# Patient Record
Sex: Female | Born: 1965 | Race: Black or African American | Hispanic: No | Marital: Married | Smoking: Former smoker
Health system: Southern US, Community
[De-identification: ages and names within clinical notes are randomized; demographics above are authoritative.]

## PROBLEM LIST (undated history)

## (undated) DIAGNOSIS — R42 Dizziness and giddiness: Secondary | ICD-10-CM

## (undated) DIAGNOSIS — F419 Anxiety disorder, unspecified: Secondary | ICD-10-CM

## (undated) DIAGNOSIS — I639 Cerebral infarction, unspecified: Secondary | ICD-10-CM

## (undated) DIAGNOSIS — R51 Headache: Secondary | ICD-10-CM

## (undated) DIAGNOSIS — I10 Essential (primary) hypertension: Secondary | ICD-10-CM

## (undated) DIAGNOSIS — J45909 Unspecified asthma, uncomplicated: Secondary | ICD-10-CM

## (undated) DIAGNOSIS — R0600 Dyspnea, unspecified: Secondary | ICD-10-CM

## (undated) DIAGNOSIS — K219 Gastro-esophageal reflux disease without esophagitis: Secondary | ICD-10-CM

## (undated) DIAGNOSIS — R519 Headache, unspecified: Secondary | ICD-10-CM

## (undated) DIAGNOSIS — E785 Hyperlipidemia, unspecified: Secondary | ICD-10-CM

## (undated) DIAGNOSIS — J449 Chronic obstructive pulmonary disease, unspecified: Secondary | ICD-10-CM

## (undated) HISTORY — DX: Gastro-esophageal reflux disease without esophagitis: K21.9

## (undated) HISTORY — DX: Dizziness and giddiness: R42

## (undated) HISTORY — DX: Hyperlipidemia, unspecified: E78.5

## (undated) HISTORY — PX: TUBAL LIGATION: SHX77

---

## 2013-05-24 ENCOUNTER — Emergency Department: Payer: Self-pay | Admitting: Emergency Medicine

## 2013-06-18 ENCOUNTER — Emergency Department: Payer: Self-pay | Admitting: Emergency Medicine

## 2013-08-05 ENCOUNTER — Emergency Department: Payer: Self-pay | Admitting: Internal Medicine

## 2013-09-03 LAB — URINALYSIS, COMPLETE
BILIRUBIN, UR: NEGATIVE
BLOOD: NEGATIVE
GLUCOSE, UR: NEGATIVE mg/dL (ref 0–75)
Ketone: NEGATIVE
LEUKOCYTE ESTERASE: NEGATIVE
Nitrite: NEGATIVE
Ph: 5 (ref 4.5–8.0)
Protein: NEGATIVE
RBC,UR: NONE SEEN /HPF (ref 0–5)
Specific Gravity: 1.019 (ref 1.003–1.030)
WBC UR: 3 /HPF (ref 0–5)

## 2013-09-03 LAB — BASIC METABOLIC PANEL
ANION GAP: 6 — AB (ref 7–16)
BUN: 13 mg/dL (ref 7–18)
CALCIUM: 8.6 mg/dL (ref 8.5–10.1)
CHLORIDE: 108 mmol/L — AB (ref 98–107)
Co2: 25 mmol/L (ref 21–32)
Creatinine: 0.74 mg/dL (ref 0.60–1.30)
GLUCOSE: 88 mg/dL (ref 65–99)
OSMOLALITY: 277 (ref 275–301)
Potassium: 3.6 mmol/L (ref 3.5–5.1)
Sodium: 139 mmol/L (ref 136–145)

## 2013-09-03 LAB — CBC
HCT: 36.8 % (ref 35.0–47.0)
HGB: 11.6 g/dL — ABNORMAL LOW (ref 12.0–16.0)
MCH: 27.9 pg (ref 26.0–34.0)
MCHC: 31.4 g/dL — ABNORMAL LOW (ref 32.0–36.0)
MCV: 89 fL (ref 80–100)
Platelet: 284 10*3/uL (ref 150–440)
RBC: 4.14 10*6/uL (ref 3.80–5.20)
RDW: 13.2 % (ref 11.5–14.5)
WBC: 4.6 10*3/uL (ref 3.6–11.0)

## 2013-09-03 LAB — TROPONIN I: Troponin-I: <0.02 ng/mL

## 2013-09-04 ENCOUNTER — Observation Stay: Payer: Self-pay | Admitting: Internal Medicine

## 2013-09-04 LAB — ETHANOL
Ethanol %: <0.003 % (ref 0.000–0.080)
Ethanol: <3 mg/dL

## 2013-12-17 ENCOUNTER — Emergency Department: Payer: Self-pay | Admitting: Internal Medicine

## 2013-12-17 LAB — BASIC METABOLIC PANEL
ANION GAP: 7 (ref 7–16)
BUN: 10 mg/dL (ref 7–18)
CREATININE: 0.87 mg/dL (ref 0.60–1.30)
Calcium, Total: 9 mg/dL (ref 8.5–10.1)
Chloride: 109 mmol/L — ABNORMAL HIGH (ref 98–107)
Co2: 24 mmol/L (ref 21–32)
EGFR (African American): >60
EGFR (Non-African Amer.): >60
Glucose: 84 mg/dL (ref 65–99)
OSMOLALITY: 278 (ref 275–301)
Potassium: 3.8 mmol/L (ref 3.5–5.1)
Sodium: 140 mmol/L (ref 136–145)

## 2013-12-17 LAB — CBC WITH DIFFERENTIAL/PLATELET
BASOS ABS: 0.1 10*3/uL (ref 0.0–0.1)
Basophil %: 0.9 %
Eosinophil #: 0.2 10*3/uL (ref 0.0–0.7)
Eosinophil %: 3.3 %
HCT: 36.2 % (ref 35.0–47.0)
HGB: 12.1 g/dL (ref 12.0–16.0)
LYMPHS ABS: 2.3 10*3/uL (ref 1.0–3.6)
LYMPHS PCT: 40.5 %
MCH: 29.2 pg (ref 26.0–34.0)
MCHC: 33.4 g/dL (ref 32.0–36.0)
MCV: 88 fL (ref 80–100)
MONO ABS: 0.7 x10 3/mm (ref 0.2–0.9)
Monocyte %: 12.2 %
Neutrophil #: 2.4 10*3/uL (ref 1.4–6.5)
Neutrophil %: 43.1 %
Platelet: 279 10*3/uL (ref 150–440)
RBC: 4.13 10*6/uL (ref 3.80–5.20)
RDW: 14.9 % — ABNORMAL HIGH (ref 11.5–14.5)
WBC: 5.6 10*3/uL (ref 3.6–11.0)

## 2014-05-13 ENCOUNTER — Emergency Department: Payer: Self-pay | Admitting: Emergency Medicine

## 2014-05-13 LAB — CBC
HCT: 39.8 % (ref 35.0–47.0)
HGB: 13.2 g/dL (ref 12.0–16.0)
MCH: 29.8 pg (ref 26.0–34.0)
MCHC: 33.2 g/dL (ref 32.0–36.0)
MCV: 90 fL (ref 80–100)
Platelet: 320 10*3/uL (ref 150–440)
RBC: 4.43 10*6/uL (ref 3.80–5.20)
RDW: 14 % (ref 11.5–14.5)
WBC: 7.5 10*3/uL (ref 3.6–11.0)

## 2014-05-13 LAB — TROPONIN I

## 2014-05-13 LAB — BASIC METABOLIC PANEL
Anion Gap: 7 (ref 7–16)
BUN: 16 mg/dL (ref 7–18)
CALCIUM: 8.6 mg/dL (ref 8.5–10.1)
Chloride: 105 mmol/L (ref 98–107)
Co2: 29 mmol/L (ref 21–32)
Creatinine: 0.96 mg/dL (ref 0.60–1.30)
EGFR (African American): >60
EGFR (Non-African Amer.): >60
Glucose: 98 mg/dL (ref 65–99)
Osmolality: 282 (ref 275–301)
Potassium: 3.3 mmol/L — ABNORMAL LOW (ref 3.5–5.1)
Sodium: 141 mmol/L (ref 136–145)

## 2014-05-14 LAB — TSH: THYROID STIMULATING HORM: 1.54 u[IU]/mL

## 2014-05-14 LAB — TROPONIN I: Troponin-I: <0.02 ng/mL

## 2014-05-14 LAB — D-DIMER(ARMC): D-Dimer: 338 ng/ml

## 2014-05-28 ENCOUNTER — Emergency Department: Payer: Self-pay | Admitting: Student

## 2014-08-18 ENCOUNTER — Emergency Department: Payer: Self-pay | Admitting: Emergency Medicine

## 2014-08-26 ENCOUNTER — Emergency Department: Payer: Self-pay | Admitting: Emergency Medicine

## 2014-10-15 NOTE — H&P (Signed)
PATIENT NAME:  Jocelyn Simon, Jocelyn Simon MR#:  [id number] DATE OF BIRTH:  03-Mar-1966  DATE OF ADMISSION:  09/04/2013  REFERRING PHYSICIAN: Valli Glance. Owens Shark, MD  PRIMARY CARE PHYSICIAN: None.   CHIEF COMPLAINT: Left-sided weakness.  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 She describes 3-day duration of left-sided weakness, which has been continuous weakness over the left face, arm and leg with associated dizziness, which she describes as room spinning sensation. This was constant. She denies any positional changes, any ear changes, any hearing loss or any tinnitus. She decided to present to the hospital with 3 days of continuing symptoms, saying that her dizziness felt worse now. Currently, she is without complaints.   REVIEW OF SYSTEMS:  CONSTITUTIONAL: Denies fever, fatigue, weakness.  EYES: Denies blurred vision, double vision, eye pain.  ENT: Denies tinnitus, ear pain, hearing loss.  RESPIRATORY: Denies cough, wheeze, shortness of breath.  CARDIOVASCULAR: Denies chest pain, palpitations, edema. GASTROINTESTINAL: Denies nausea, vomiting, diarrhea, abdominal pain. GENITOURINARY: Denies dysuria or hematuria. ENDOCRINE: Denies nocturia or thyroid problems. HEMATOLOGIC AND LYMPHATIC: Denies easy bruising or bleeding.  SKIN: Denies rash or lesions.  MUSCULOSKELETAL: Denies pain in the neck, back, shoulders, knees, hips or arthritic symptoms.  NEUROLOGIC: Positive for numbness and weakness over the entire left side of her body, including face, upper extremity and lower extremity. Denies any headaches. PSYCHIATRIC: Denies any anxiety or depressive symptoms.  Otherwise, full review of systems performed by me is negative.   PAST MEDICAL HISTORY: Self-reported CVA x2 without residual deficit.   SOCIAL HISTORY: Ex-tobacco use. Denies any alcohol or drug usage.    FAMILY HISTORY: Positive for coronary artery disease as well as CVAs.   ALLERGIES: No known allergies.   HOME MEDICATIONS: Aspirin 81 mg p.o. daily.   PHYSICAL EXAMINATION:  VITAL SIGNS: Temperature 98.6, heart rate 69, respirations 22, blood pressure 137/87, saturating 98% on room air. Weight 72.6 kg, BMI of 30.3.  GENERAL: Well-nourished, well-developed, African-American female, currently in no acute distress.  HEAD: Normocephalic, atraumatic.  EYES: Pupils equal, round and reactive to light. Extraocular muscles intact. No scleral icterus.  MOUTH: Moist mucous membranes. Dentition intact. No abscess noted.  EARS, NOSE AND THROAT: Clear without exudates. No external lesions.  NECK: Supple. No thyromegaly. No nodules. No JVD.  PULMONARY: Clear to auscultation bilaterally without wheeze, rubs or rhonchi. No use of accessory muscles. Good respiratory effort.  CHEST: Nontender to palpation. CARDIOVASCULAR: S1, S2, regular rate and rhythm. No murmurs, rubs or gallops. No edema. Pedal pulses 2+ bilaterally.  GASTROINTESTINAL: Soft, nontender, nondistended. No masses. Positive bowel sounds. No hepatosplenomegaly.  MUSCULOSKELETAL: No swelling, clubbing or edema. Range of motion full in all extremities.  NEUROLOGICAL: Cranial nerves II through XII intact. However, she does mention having subjective decreased sensation over the entire left face. Sensation is diminished over the left face, left arm and left leg. Reflexes, however, are intact. Pronator drift within normal limits. Strength 5 out of 5 in right upper extremity, including proximal and distal flexor and extensor muscle groups. On the left upper extremity, hand grip 3 out of 5, and the 4- out of 5 for remainder of muscle groups, including proximal and distal flexors and extensors. Lower extremities: Right lower extremity strength 5 out of 5. Left lower extremity in both proximal and distal 3 out of 5; however, dysdiadochokinesis was within  normal limits. Finger-to-nose within normal limits as well. Gait deferred at  this time.  SKIN: No ulcerations, lesions, rashes, cyanosis. Skin warm and dry. Turgor intact.  PSYCHIATRIC: Mood and affect blunted. Awake, alert and oriented x3. Insight and judgment intact.   LABORATORY DATA: Sodium 139, potassium 3.6, chloride 108, bicarbonate 25, BUN 13, creatinine 0.74, glucose 88. WBC 4.6, hemoglobin 11.6, platelets 284. Urinalysis negative for evidence of infection.   IMAGING: CT head performed revealing no acute intracranial process as well as no evidence of old stroke. Chest x-ray performed revealing slightly hyperexpanded lungs; however, no acute cardiopulmonary process.   ASSESSMENT AND PLAN: A 49 year old African-American female with past medical history of self-reported stroke x2, presenting with left-sided weakness for a 3-day duration.  1. Cerebrovascular accident. CT head within normal limits. She has been given aspirin. Will continue aspirin and statin therapy with neuro checks q.4 hours. Place on telemetry under observation. Check MRI, echocardiogram with Doppler studies. Allow permissive hypertension, treating only if systolic blood pressure greater than 160 or diastolic over 109. At that time, will give hydralazine 10 mg IV. Avoid heparin for the first 24 to 48 hours.  2. Venous thromboembolism prophylaxis with SCDs.   CODE STATUS: The patient is full code.   TIME SPENT: 45 minutes.   ____________________________ Aaron Mose. Hower, MD dkh:lb D: 09/04/2013 01:12:01 ET T: 09/04/2013 06:42:47 ET JOB#: [id number]  cc: Aaron Mose. Hower, MD, <Dictator> DAVID Woodfin Ganja MD ELECTRONICALLY SIGNED 09/05/2013 1:52

## 2014-10-15 NOTE — Discharge Summary (Signed)
PATIENT NAME:  Jocelyn Simon, Jocelyn Simon MR#:  [id number] DATE OF BIRTH:  September 10, 1965  DATE OF ADMISSION:  09/04/2013 DATE OF DISCHARGE:  09/05/2013  PRIMARY CARE PHYSICIAN: Dr. Franchot Mimes  DISCHARGE DIAGNOSES: Left-sided weakness with a history of cerebrovascular accident.   CONDITION: Stable.   CODE STATUS: FULL.  HOME MEDICATIONS:  1.  Aspirin 81 mg p.o. daily. 2.  Atorvastatin 20 mg p.o. daily.  HOME CARE: The patient may need home health and physical therapy according to physical therapy evaluation.   DISCHARGE DIET: Low-sodium, low-fat, low-cholesterol.  DISCHARGE ACTIVITY: As tolerated.  FOLLOWUP CARE: Follow up with PCP within 1 to 2 weeks.   REASON FOR ADMISSION: Left-sided weakness.   HOSPITAL COURSE: The patient is a 49 year old African American female with a history of CVA twice, presented to the ED with left side weakness for 3 days associated with dizziness. For detailed history and physical examination, please refer to the admission note dictated by Dr. Lavetta Nielsen.   On admission date, the patient's CAT scan of head did not show any intracranial acute process. Chest x-ray did not show any cardiopulmonary process. Urinalysis is negative. WBC 4.6, hemoglobin 11.6. Electrolytes normal. BUN 13, creatinine 0.74.   For left side weakness with history of CVA, after admission the patient has been treated with aspirin and statin with telemonitor. The patient got a MRI which was normal. In addition, carotid duplex did not show any carotid stenosis. Echocardiogram showed normal ejection fraction at 55% to 60%. The patient underwent physical therapy evaluation today. They suggest the patient needs home health and home PT, shower chair and a cane. The patient has no complaints. Her vital signs are stable. She is clinically stable and will be discharged home with home health and PT today. I discussed the patient's discharge plan with the patient, the patient's family member and nurse.  TIME SPENT:  About 37 minutes.   ____________________________ Demetrios Loll, MD qc:sb D: 09/05/2013 15:06:42 ET T: 09/06/2013 11:43:07 ET JOB#: [id number]  cc: Demetrios Loll, MD, <Dictator> Demetrios Loll MD ELECTRONICALLY SIGNED 09/06/2013 16:39

## 2014-11-11 ENCOUNTER — Encounter: Payer: Self-pay | Admitting: Emergency Medicine

## 2014-11-11 ENCOUNTER — Emergency Department
Admission: EM | Admit: 2014-11-11 | Discharge: 2014-11-11 | Disposition: A | Discharge status: Home / Self Care | Payer: Self-pay | Attending: Emergency Medicine | Admitting: Emergency Medicine

## 2014-11-11 ENCOUNTER — Emergency Department: Payer: Self-pay

## 2014-11-11 ENCOUNTER — Other Ambulatory Visit: Payer: Self-pay

## 2014-11-11 DIAGNOSIS — I10 Essential (primary) hypertension: Secondary | ICD-10-CM | POA: Insufficient documentation

## 2014-11-11 DIAGNOSIS — Z720 Tobacco use: Secondary | ICD-10-CM | POA: Insufficient documentation

## 2014-11-11 DIAGNOSIS — J40 Bronchitis, not specified as acute or chronic: Secondary | ICD-10-CM | POA: Insufficient documentation

## 2014-11-11 DIAGNOSIS — R0789 Other chest pain: Secondary | ICD-10-CM | POA: Insufficient documentation

## 2014-11-11 HISTORY — DX: Cerebral infarction, unspecified: I63.9

## 2014-11-11 HISTORY — DX: Essential (primary) hypertension: I10

## 2014-11-11 LAB — CBC WITH DIFFERENTIAL/PLATELET
BASOS ABS: 0.1 10*3/uL (ref 0–0.1)
BASOS PCT: 1 %
EOS ABS: 0.2 10*3/uL (ref 0–0.7)
Eosinophils Relative: 4 %
HCT: 37.7 % (ref 35.0–47.0)
Hemoglobin: 12.2 g/dL (ref 12.0–16.0)
Lymphocytes Relative: 36 %
Lymphs Abs: 2.4 10*3/uL (ref 1.0–3.6)
MCH: 28.3 pg (ref 26.0–34.0)
MCHC: 32.4 g/dL (ref 32.0–36.0)
MCV: 87.4 fL (ref 80.0–100.0)
MONOS PCT: 9 %
Monocytes Absolute: 0.6 10*3/uL (ref 0.2–0.9)
NEUTROS ABS: 3.3 10*3/uL (ref 1.4–6.5)
NEUTROS PCT: 50 %
PLATELETS: 301 10*3/uL (ref 150–440)
RBC: 4.32 MIL/uL (ref 3.80–5.20)
RDW: 13.6 % (ref 11.5–14.5)
WBC: 6.6 10*3/uL (ref 3.6–11.0)

## 2014-11-11 LAB — COMPREHENSIVE METABOLIC PANEL
ALBUMIN: 4.5 g/dL (ref 3.5–5.0)
ALK PHOS: 59 U/L (ref 38–126)
ALT: 33 U/L (ref 14–54)
AST: 28 U/L (ref 15–41)
Anion gap: 7 (ref 5–15)
BILIRUBIN TOTAL: 0.4 mg/dL (ref 0.3–1.2)
BUN: 20 mg/dL (ref 6–20)
CALCIUM: 9.1 mg/dL (ref 8.9–10.3)
CO2: 28 mmol/L (ref 22–32)
Chloride: 106 mmol/L (ref 101–111)
Creatinine, Ser: 0.9 mg/dL (ref 0.44–1.00)
GFR calc Af Amer: >60 mL/min (ref >60)
GFR calc non Af Amer: >60 mL/min (ref >60)
GLUCOSE: 90 mg/dL (ref 65–99)
Potassium: 4.5 mmol/L (ref 3.5–5.1)
SODIUM: 141 mmol/L (ref 135–145)
Total Protein: 8.1 g/dL (ref 6.5–8.1)

## 2014-11-11 LAB — TROPONIN I

## 2014-11-11 MED ORDER — AZITHROMYCIN 250 MG PO TABS: ORAL_TABLET | ORAL | Status: AC

## 2014-11-11 MED ORDER — HYDROCOD POLST-CPM POLST ER 10-8 MG/5ML PO SUER
ORAL | Status: AC
  Administered 2014-11-11: 5 mL via ORAL
  Filled 2014-11-11: qty 5

## 2014-11-11 MED ORDER — ALBUTEROL SULFATE HFA 108 (90 BASE) MCG/ACT IN AERS: 2.0000 | INHALATION_SPRAY | Freq: Four times a day (QID) | RESPIRATORY_TRACT | Status: DC | PRN

## 2014-11-11 MED ORDER — CYCLOBENZAPRINE HCL 10 MG PO TABS
5.0000 mg | ORAL_TABLET | Freq: Once | ORAL | Status: AC
  Administered 2014-11-11: 5 mg via ORAL

## 2014-11-11 MED ORDER — HYDROCOD POLST-CPM POLST ER 10-8 MG/5ML PO SUER
5.0000 mL | Freq: Once | ORAL | Status: AC
  Administered 2014-11-11: 5 mL via ORAL

## 2014-11-11 MED ORDER — CYCLOBENZAPRINE HCL 5 MG PO TABS: 5.0000 mg | ORAL_TABLET | Freq: Three times a day (TID) | ORAL | Status: DC | PRN

## 2014-11-11 MED ORDER — GUAIFENESIN-CODEINE 100-10 MG/5ML PO SOLN: 5.0000 mL | Freq: Three times a day (TID) | ORAL | Status: DC | PRN

## 2014-11-11 MED ORDER — CYCLOBENZAPRINE HCL 10 MG PO TABS
ORAL_TABLET | ORAL | Status: AC
  Administered 2014-11-11: 5 mg via ORAL
  Filled 2014-11-11: qty 1

## 2014-11-11 NOTE — Discharge Instructions (Signed)
Chest Pain (Nonspecific) °It is often hard to give a specific diagnosis for the cause of chest pain. There is always a chance that your pain could be related to something serious, such as a heart attack or a blood clot in the lungs. You need to follow up with your health care provider for further evaluation. °CAUSES  °· Heartburn. °· Pneumonia or bronchitis. °· Anxiety or stress. °· Inflammation around your heart (pericarditis) or lung (pleuritis or pleurisy). °· A blood clot in the lung. °· A collapsed lung (pneumothorax). It can develop suddenly on its own (spontaneous pneumothorax) or from trauma to the chest. °· Shingles infection (herpes zoster virus). °The chest wall is composed of bones, muscles, and cartilage. Any of these can be the source of the pain. °· The bones can be bruised by injury. °· The muscles or cartilage can be strained by coughing or overwork. °· The cartilage can be affected by inflammation and become sore (costochondritis). °DIAGNOSIS  °Lab tests or other studies may be needed to find the cause of your pain. Your health care provider may have you take a test called an ambulatory electrocardiogram (ECG). An ECG records your heartbeat patterns over a 24-hour period. You may also have other tests, such as: °· Transthoracic echocardiogram (TTE). During echocardiography, sound waves are used to evaluate how blood flows through your heart. °· Transesophageal echocardiogram (TEE). °· Cardiac monitoring. This allows your health care provider to monitor your heart rate and rhythm in real time. °· Holter monitor. This is a portable device that records your heartbeat and can help diagnose heart arrhythmias. It allows your health care provider to track your heart activity for several days, if needed. °· Stress tests by exercise or by giving medicine that makes the heart beat faster. °TREATMENT  °· Treatment depends on what may be causing your chest pain. Treatment may include: °¨ Acid blockers for  heartburn. °¨ Anti-inflammatory medicine. °¨ Pain medicine for inflammatory conditions. °¨ Antibiotics if an infection is present. °· You may be advised to change lifestyle habits. This includes stopping smoking and avoiding alcohol, caffeine, and chocolate. °· You may be advised to keep your head raised (elevated) when sleeping. This reduces the chance of acid going backward from your stomach into your esophagus. °Most of the time, nonspecific chest pain will improve within 2-3 days with rest and mild pain medicine.  °HOME CARE INSTRUCTIONS  °· If antibiotics were prescribed, take them as directed. Finish them even if you start to feel better. °· For the next few days, avoid physical activities that bring on chest pain. Continue physical activities as directed. °· Do not use any tobacco products, including cigarettes, chewing tobacco, or electronic cigarettes. °· Avoid drinking alcohol. °· Only take medicine as directed by your health care provider. °· Follow your health care provider's suggestions for further testing if your chest pain does not go away. °· Keep any follow-up appointments you made. If you do not go to an appointment, you could develop lasting (chronic) problems with pain. If there is any problem keeping an appointment, call to reschedule. °SEEK MEDICAL CARE IF:  °· Your chest pain does not go away, even after treatment. °· You have a rash with blisters on your chest. °· You have a fever. °SEEK IMMEDIATE MEDICAL CARE IF:  °· You have increased chest pain or pain that spreads to your arm, neck, jaw, back, or abdomen. °· You have shortness of breath. °· You have an increasing cough, or you cough   up blood.  You have severe back or abdominal pain.  You feel nauseous or vomit.  You have severe weakness.  You faint.  You have chills. This is an emergency. Do not wait to see if the pain will go away. Get medical help at once. Call your local emergency services (911 in [place] Do not drive  yourself to the hospital. MAKE SURE YOU:   Understand these instructions.  Will watch your condition.  Will get help right away if you are not doing well or get worse. Document Released: 03/20/2005 Document Revised: 06/15/2013 Document Reviewed: 01/14/2008 [facility] Patient Information 2015 [place], [place]. This information is not intended to replace advice given to you by your health care provider. Make sure you discuss any questions you have with your health care provider.  Cough, Adult  A cough is a reflex. It helps you clear your throat and airways. A cough can help heal your body. A cough can last 2 or 3 weeks (acute) or may last more than 8 weeks (chronic). Some common causes of a cough can include an infection, allergy, or a cold. HOME CARE  Only take medicine as told by your doctor.  If given, take your medicines (antibiotics) as told. Finish them even if you start to feel better.  Use a cold steam vaporizer or humidifier in your home. This can help loosen thick spit (secretions).  Sleep so you are almost sitting up (semi-upright). Use pillows to do this. This helps reduce coughing.  Rest as needed.  Stop smoking if you smoke. GET HELP RIGHT AWAY IF:  You have yellowish-white fluid (pus) in your thick spit.  Your cough gets worse.  Your medicine does not reduce coughing, and you are losing sleep.  You cough up blood.  You have trouble breathing.  Your pain gets worse and medicine does not help.  You have a fever. MAKE SURE YOU:   Understand these instructions.  Will watch your condition.  Will get help right away if you are not doing well or get worse. Document Released: 02/21/2011 Document Revised: 10/25/2013 Document Reviewed: 02/21/2011 [facility] Patient Information 2015 [place], [place]. This information is not intended to replace advice given to you by your health care provider. Make sure you discuss any questions you have with your health care provider.

## 2014-11-11 NOTE — ED Notes (Signed)
C/o upper abd. Pain and left flank pain with sob and nausea, states pain is worse with movement and inspiration, states last night she was lightheaded and felt like she was going to pass out

## 2014-11-11 NOTE — ED Notes (Signed)
Pt in no distress, skin warm and dry, still c/o back and abd. Pain, pt advised of wait, verbalized understanding of wait

## 2014-11-11 NOTE — ED Notes (Signed)
Patient updated on wait time. Warm blanket given. No other needs at this time.

## 2014-11-11 NOTE — ED Provider Notes (Signed)
[facility] Emergency Department Provider Note  Time seen: 7:19 PM  I have reviewed the triage vital signs and the nursing notes.   HISTORY  Chief Complaint Abdominal Pain; Nausea; and Back Pain    HPI Jocelyn Simon is a 49 y.o. female with a past medical history of hypertension, CVA presents to the emergency department with cough times one to 2 weeks, and chest pain. According to the patient she has been having bilateral lower chest pain which occurs with coughing. Patient states occasional sputum but mostly a dry cough. Denies fever. States this has happened to her before with episodes of bronchitis. Patient denies any worsening with exertion or deep breathing. But definitely worsens with coughing. Describes the pain as sharp, constant, moderate/severe in intensity. No radiation. No shortness of breath, nausea, diaphoresis.    Past Medical History  Diagnosis Date  . Hypertension   . Stroke     There are no active problems to display for this patient.   No past surgical history on file.  No current outpatient prescriptions on file.  Allergies Review of patient's allergies indicates not on file.  No family history on file.  Social History History  Substance Use Topics  . Smoking status: Current Every Day Smoker    Types: Cigarettes  . Smokeless tobacco: Not on file  . Alcohol Use: Yes    Review of Systems Constitutional: Negative for fever. Cardiovascular: Positive for chest wall pain. Respiratory: Negative for shortness of breath. Positive for dry cough. Gastrointestinal: Negative for abdominal pain, vomiting and diarrhea. Musculoskeletal: Negative for back pain. 10-point ROS otherwise negative.  ____________________________________________   PHYSICAL EXAM:  VITAL SIGNS: ED Triage Vitals  Enc Vitals Group     BP 11/11/14 1334 141/88 mmHg     Pulse Rate 11/11/14 1334 74     Resp 11/11/14 1659 18     Temp 11/11/14 1334 98.6 F  (37 C)     Temp Source 11/11/14 1334 Oral     SpO2 11/11/14 1337 97 %     Weight 11/11/14 1334 179 lb (81.194 kg)     Height 11/11/14 1334 5\' 1"  (1.549 m)     Head Cir --      Peak Flow --      Pain Score 11/11/14 1336 10     Pain Loc --      Pain Edu? --      Excl. in [place]? --     Constitutional: Alert and oriented. Well appearing and in no distress. ENT   Mouth/Throat: Mucous membranes are moist. Cardiovascular: Normal rate, regular rhythm. No murmurs, rubs, or gallops. Respiratory: Normal respiratory effort without tachypnea nor retractions. Breath sounds are clear and equal bilaterally. No wheezes/rales/rhonchi. Moderate bilateral anterior lower chest tenderness to palpation. Gastrointestinal: Soft and nontender. No distention. Musculoskeletal: Nontender with normal range of motion in all extremities.  Neurologic:  Normal speech and language. No gross focal neurologic deficits  Skin:  Skin is warm, dry and intact.  Psychiatric: Mood and affect are normal. Speech and behavior are normal. ____________________________________________    EKG  EKG reviewed and interpreted by myself shows normal sinus rhythm at 71 bpm, narrow QRS, normal axis, normal intervals, no ST changes noted.  ____________________________________________    RADIOLOGY  No acute findings on chest x-ray.  ____________________________________________   INITIAL IMPRESSION / ASSESSMENT AND PLAN / ED COURSE  Pertinent labs & imaging results that were available during my care of the patient were reviewed by  me and considered in my medical decision making (see chart for details).  49 year old female with cough 1 week, and dry, chest pain with coughing. Exam most consistent with bronchitis. We'll treat with antibiotics and albuterol. We'll treat cough and have patient follow up with her primary care doctor. Patient agreeable plan.  ____________________________________________   FINAL CLINICAL  IMPRESSION(S) / ED DIAGNOSES  Bronchitis Chest wall pain   Harvest Dark, MD 11/11/14 [phone]

## 2014-11-11 NOTE — ED Notes (Signed)
Pt states that she has had a cough x 1 week and that she has been feeling nauseous for a week as well. States that she has been able to drink and that she has had reduced intake of food. Pt states that she has extreme pain (10/10) in her ribs as well as epigastric area. Pt thinks she may have cracked a rib in her left side and that her back hurts as well. Pt alert & oriented.

## 2014-11-11 NOTE — ED Notes (Signed)
Lab results reviewed

## 2014-11-29 ENCOUNTER — Emergency Department: Payer: Self-pay

## 2014-11-29 ENCOUNTER — Emergency Department
Admission: EM | Admit: 2014-11-29 | Discharge: 2014-11-29 | Disposition: A | Discharge status: Home / Self Care | Payer: Self-pay | Attending: Emergency Medicine | Admitting: Emergency Medicine

## 2014-11-29 ENCOUNTER — Encounter: Payer: Self-pay | Admitting: Emergency Medicine

## 2014-11-29 DIAGNOSIS — W1839XA Other fall on same level, initial encounter: Secondary | ICD-10-CM | POA: Insufficient documentation

## 2014-11-29 DIAGNOSIS — F419 Anxiety disorder, unspecified: Secondary | ICD-10-CM | POA: Insufficient documentation

## 2014-11-29 DIAGNOSIS — Y9289 Other specified places as the place of occurrence of the external cause: Secondary | ICD-10-CM | POA: Insufficient documentation

## 2014-11-29 DIAGNOSIS — Y998 Other external cause status: Secondary | ICD-10-CM | POA: Insufficient documentation

## 2014-11-29 DIAGNOSIS — I10 Essential (primary) hypertension: Secondary | ICD-10-CM | POA: Insufficient documentation

## 2014-11-29 DIAGNOSIS — Z720 Tobacco use: Secondary | ICD-10-CM | POA: Insufficient documentation

## 2014-11-29 DIAGNOSIS — S20212A Contusion of left front wall of thorax, initial encounter: Secondary | ICD-10-CM | POA: Insufficient documentation

## 2014-11-29 DIAGNOSIS — Y9389 Activity, other specified: Secondary | ICD-10-CM | POA: Insufficient documentation

## 2014-11-29 MED ORDER — HYDROCODONE-ACETAMINOPHEN 5-325 MG PO TABS: 1.0000 | ORAL_TABLET | Freq: Four times a day (QID) | ORAL | Status: DC | PRN

## 2014-11-29 MED ORDER — KETOROLAC TROMETHAMINE 30 MG/ML IJ SOLN
30.0000 mg | Freq: Once | INTRAMUSCULAR | Status: AC
  Administered 2014-11-29: 30 mg via INTRAVENOUS

## 2014-11-29 MED ORDER — DIAZEPAM 5 MG/ML IJ SOLN
INTRAMUSCULAR | Status: AC
  Administered 2014-11-29: 5 mg via INTRAVENOUS
  Filled 2014-11-29: qty 2

## 2014-11-29 MED ORDER — IBUPROFEN 800 MG PO TABS: 800.0000 mg | ORAL_TABLET | Freq: Three times a day (TID) | ORAL | Status: DC | PRN

## 2014-11-29 MED ORDER — ONDANSETRON HCL 4 MG/2ML IJ SOLN
INTRAMUSCULAR | Status: AC
Start: 2014-11-29 — End: 2014-11-29
  Administered 2014-11-29: 4 mg via INTRAVENOUS
  Filled 2014-11-29: qty 2

## 2014-11-29 MED ORDER — CYCLOBENZAPRINE HCL 10 MG PO TABS: 10.0000 mg | ORAL_TABLET | Freq: Three times a day (TID) | ORAL | Status: DC | PRN

## 2014-11-29 MED ORDER — ONDANSETRON HCL 4 MG/2ML IJ SOLN
4.0000 mg | Freq: Once | INTRAMUSCULAR | Status: AC
  Administered 2014-11-29: 4 mg via INTRAVENOUS

## 2014-11-29 MED ORDER — KETOROLAC TROMETHAMINE 30 MG/ML IJ SOLN
INTRAMUSCULAR | Status: AC
  Administered 2014-11-29: 30 mg via INTRAVENOUS
  Filled 2014-11-29: qty 1

## 2014-11-29 MED ORDER — HYDROMORPHONE HCL 1 MG/ML IJ SOLN
0.5000 mg | Freq: Once | INTRAMUSCULAR | Status: AC
  Administered 2014-11-29: 0.5 mg via INTRAVENOUS

## 2014-11-29 MED ORDER — HYDROMORPHONE HCL 1 MG/ML IJ SOLN
INTRAMUSCULAR | Status: AC
  Administered 2014-11-29: 0.5 mg via INTRAVENOUS
  Filled 2014-11-29: qty 1

## 2014-11-29 MED ORDER — DIAZEPAM 5 MG/ML IJ SOLN
5.0000 mg | Freq: Once | INTRAMUSCULAR | Status: AC
  Administered 2014-11-29: 5 mg via INTRAVENOUS

## 2014-11-29 NOTE — ED Notes (Signed)
Prior to administration of narcotics, attempted to clarify with the pt place of employment. Pt gave me phone number which was disconnected and kept saying she worked for [employer]. After researching and clarifying the same, pt now says she works through [employer]

## 2014-11-29 NOTE — ED Notes (Addendum)
Pt to triage; st fell at work on conveyer belt at 245pm; now with pain to left lower ribcage that increases with deep breathing; pt reports employeed at "[employer]" in [place] but does not have the contact information; unable to locate this company online or in our workers comp profile

## 2014-11-29 NOTE — ED Provider Notes (Signed)
CSN: [id number]     Arrival date & time 11/29/14  1954 History   First MD Initiated Contact with Patient 11/29/14 2112     Chief Complaint  Patient presents with  . Rib Injury     (Consider location/radiation/quality/duration/timing/severity/associated sxs/prior Treatment) HPI Patient is here today brought in via EMS after a fall from work earlier today she fell approximately 2:45 PM has had developed worsening pain in her left lower rib cage hurts with any type of movement rates it about a 10 out of 10 any type of movement breathing cough and laughing making it worse holding still straight and lying at a slight angle seems to make it at her denies any chest pain otherwise shortness of breath has had no trouble going to the bathroom noticed no blood in her stool or urine and otherwise no complaints at this time is here today for evaluation of possible rib fracture  Past Medical History  Diagnosis Date  . Hypertension   . Stroke    History reviewed. No pertinent past surgical history. No family history on file. History  Substance Use Topics  . Smoking status: Current Every Day Smoker    Types: Cigarettes  . Smokeless tobacco: Not on file  . Alcohol Use: Yes   OB History    No data available     Review of Systems  Constitutional: Negative.   HENT: Negative.   Eyes: Negative.   Respiratory: Negative.   Cardiovascular: Negative.   Skin: Negative.   Neurological: Negative.   All other systems reviewed and are negative.     Allergies  Review of patient's allergies indicates no known allergies.  Home Medications   Prior to Admission medications   Medication Sig Start Date End Date Taking? Authorizing Provider  albuterol (PROVENTIL HFA;VENTOLIN HFA) 108 (90 BASE) MCG/ACT inhaler Inhale 2 puffs into the lungs every 6 (six) hours as needed for wheezing or shortness of breath. 11/11/14   Harvest Dark, MD  cyclobenzaprine (FLEXERIL) 10 MG tablet Take 1 tablet (10 mg total)  by mouth every 8 (eight) hours as needed for muscle spasms. 11/29/14 11/29/15  Kalix Meinecke William C Leeya Rusconi, PA-C  cyclobenzaprine (FLEXERIL) 5 MG tablet Take 1 tablet (5 mg total) by mouth every 8 (eight) hours as needed for muscle spasms. 11/11/14 11/11/15  Harvest Dark, MD  guaiFENesin-codeine 100-10 MG/5ML syrup Take 5 mLs by mouth 3 (three) times daily as needed for cough. 11/11/14   Harvest Dark, MD  HYDROcodone-acetaminophen (NORCO) 5-325 MG per tablet Take 1 tablet by mouth every 6 (six) hours as needed for moderate pain. 11/29/14   Basil Buffin William C Bert Givans, PA-C  ibuprofen (ADVIL,MOTRIN) 800 MG tablet Take 1 tablet (800 mg total) by mouth every 8 (eight) hours as needed. 11/29/14   Tracyann Duffell William C Audrionna Lampton, PA-C   BP 144/92 mmHg  Pulse 92  Temp(Src) 97.9 F (36.6 C) (Oral)  Resp 20  Ht 5\' 1"  (1.549 m)  Wt 179 lb (81.194 kg)  BMI 33.84 kg/m2  SpO2 98% Physical Exam Female appearing stated age well-developed well-nourished in mild distress Vitals reviewed Head ears eyes nose neck and throat exam this patient unremarkable Cardia cardiovascular regular rate and rhythm no murmurs rubs gallops Pulmonary lungs are clear to auscultation bilaterally Abdomen soft flat nontender bowel sounds positive all 4 quadrants Skin free of rash or disease Psychologically patient is anxious but otherwise acting appropriate Musculoskeletal she has pain with palpation over the areas of the lateral 10th ninth and eighth ribs without  palpable deformity abnormality or crepitus Neuro exams nonfocal cranial nerves II through XII grossly intact  ED Course  Procedures (including critical care time) Labs Review Labs Reviewed - No data to display  Imaging Review Dg Ribs Unilateral W/chest Left  11/29/2014   CLINICAL DATA:  Fall on to conveyer belt at work today. Left lower rib pain.  EXAM: LEFT RIBS AND CHEST - 3+ VIEW  COMPARISON:  11/11/2014  FINDINGS: Heart is normal size. Mediastinal contours are within normal limits.  Left base atelectasis. Right lung is clear. No visible rib fracture. No pneumothorax. No effusions.  IMPRESSION: Left base atelectasis.   Electronically Signed   By: Rolm Baptise M.D.   On: 11/29/2014 21:14     EKG Interpretation None      MDM  Decision making on this patient she fell down from a height of about 3 feet onto a conveyer belt striking her left ribs she has pain with movement and touch to that area after receiving some Dilaudid Valium and Toradol in the department she is able to ambulate and move without difficulty though with some pain we discussed with her that without rib fracture she will still hurt were giving her prescriptions for medications for pain management she should follow-up with her doctor or clinic or with whatever Worker's Comp. facility her company uses return here for any acute concerns or worsening symptoms  Final diagnoses:  Rib contusion, left, initial encounter        Ching Rabideau Verdene Rio, PA-C 11/29/14 2244  Lavonia Drafts, MD 11/29/14 [phone]

## 2014-11-29 NOTE — Discharge Instructions (Signed)

## 2014-12-07 ENCOUNTER — Ambulatory Visit: Payer: Self-pay

## 2015-02-06 ENCOUNTER — Emergency Department
Admission: EM | Admit: 2015-02-06 | Discharge: 2015-02-07 | Disposition: A | Discharge status: Home / Self Care | Payer: Self-pay | Attending: Emergency Medicine | Admitting: Emergency Medicine

## 2015-02-06 ENCOUNTER — Encounter: Payer: Self-pay | Admitting: *Deleted

## 2015-02-06 DIAGNOSIS — I159 Secondary hypertension, unspecified: Secondary | ICD-10-CM | POA: Insufficient documentation

## 2015-02-06 DIAGNOSIS — Z79899 Other long term (current) drug therapy: Secondary | ICD-10-CM | POA: Insufficient documentation

## 2015-02-06 DIAGNOSIS — R040 Epistaxis: Secondary | ICD-10-CM | POA: Insufficient documentation

## 2015-02-06 DIAGNOSIS — Z792 Long term (current) use of antibiotics: Secondary | ICD-10-CM | POA: Insufficient documentation

## 2015-02-06 NOTE — ED Notes (Signed)
Pt arrives via ems with right sided nose bleed about 2145 tonight. Pt used afrin PTA of EMS, pt with nasal clamp in place on arrival

## 2015-02-07 ENCOUNTER — Ambulatory Visit: Payer: Self-pay

## 2015-02-07 LAB — CBC
HEMATOCRIT: 35.8 % (ref 35.0–47.0)
HEMOGLOBIN: 11.8 g/dL — AB (ref 12.0–16.0)
MCH: 28.7 pg (ref 26.0–34.0)
MCHC: 32.9 g/dL (ref 32.0–36.0)
MCV: 87 fL (ref 80.0–100.0)
Platelets: 301 10*3/uL (ref 150–440)
RBC: 4.12 MIL/uL (ref 3.80–5.20)
RDW: 14.4 % (ref 11.5–14.5)
WBC: 7.5 10*3/uL (ref 3.6–11.0)

## 2015-02-07 MED ORDER — HYDROCHLOROTHIAZIDE 25 MG PO TABS: 25.0000 mg | ORAL_TABLET | Freq: Every day | ORAL | Status: DC

## 2015-02-07 MED ORDER — OXYCODONE-ACETAMINOPHEN 5-325 MG PO TABS
ORAL_TABLET | ORAL | Status: AC
  Administered 2015-02-07: 1 via ORAL
  Filled 2015-02-07: qty 1

## 2015-02-07 MED ORDER — LORAZEPAM 2 MG/ML IJ SOLN
1.0000 mg | Freq: Once | INTRAMUSCULAR | Status: AC
  Administered 2015-02-07: 1 mg via INTRAVENOUS
  Filled 2015-02-07: qty 1

## 2015-02-07 MED ORDER — CEPHALEXIN 500 MG PO CAPS: 500.0000 mg | ORAL_CAPSULE | Freq: Four times a day (QID) | ORAL | Status: AC

## 2015-02-07 MED ORDER — BUTAMBEN-TETRACAINE-BENZOCAINE 2-2-14 % EX AERO
1.0000 | INHALATION_SPRAY | Freq: Once | CUTANEOUS | Status: AC
  Administered 2015-02-07: 1 via TOPICAL

## 2015-02-07 MED ORDER — BUTAMBEN-TETRACAINE-BENZOCAINE 2-2-14 % EX AERO
INHALATION_SPRAY | CUTANEOUS | Status: AC
  Administered 2015-02-07: 1 via TOPICAL
  Filled 2015-02-07: qty 20

## 2015-02-07 MED ORDER — OXYCODONE-ACETAMINOPHEN 5-325 MG PO TABS
1.0000 | ORAL_TABLET | Freq: Once | ORAL | Status: AC
  Administered 2015-02-07: 1 via ORAL

## 2015-02-07 MED ORDER — HYDROCHLOROTHIAZIDE 25 MG PO TABS
25.0000 mg | ORAL_TABLET | Freq: Every day | ORAL | Status: DC
  Administered 2015-02-07: 25 mg via ORAL

## 2015-02-07 MED ORDER — SODIUM CHLORIDE 0.9 % IV BOLUS (SEPSIS)
1000.0000 mL | Freq: Once | INTRAVENOUS | Status: AC
Start: 2015-02-07 — End: 2015-02-07
  Administered 2015-02-07: 1000 mL via INTRAVENOUS

## 2015-02-07 MED ORDER — BUTALBITAL-APAP-CAFFEINE 50-325-40 MG PO TABS
2.0000 | ORAL_TABLET | Freq: Once | ORAL | Status: AC
  Administered 2015-02-07: 2 via ORAL
  Filled 2015-02-07: qty 2

## 2015-02-07 MED ORDER — ONDANSETRON HCL 4 MG/2ML IJ SOLN
4.0000 mg | Freq: Once | INTRAMUSCULAR | Status: AC
  Administered 2015-02-07: 4 mg via INTRAVENOUS
  Filled 2015-02-07: qty 2

## 2015-02-07 MED ORDER — HYDROCHLOROTHIAZIDE 25 MG PO TABS
ORAL_TABLET | ORAL | Status: AC
Start: 2015-02-07 — End: 2015-02-07
  Administered 2015-02-07: 25 mg via ORAL
  Filled 2015-02-07: qty 1

## 2015-02-07 NOTE — ED Notes (Signed)
MD at bedside. 

## 2015-02-07 NOTE — ED Provider Notes (Signed)
[facility] Emergency Department Provider Note  ____________________________________________  Time seen: Approximately 0002 AM  I have reviewed the triage vital signs and the nursing notes.   HISTORY  Chief Complaint Epistaxis    HPI KALAYAH LESKE is a 49 y.o. female who comes in today with a nosebleed. The patient reports that she had the first bleed last night. The patient reports that she took her husband to work and when she stood up started feeling blood coming out the right side of her nose. The patient reports that she called the ambulance but they told her that she did not need to come to the hospital. She was given aspirin and sent home. The patient reports that tonight the same thing happen she was taking her husband to work and then when she came out she stood up and had some bleeding from her right nose as well. The patient reports though that tonight the bleeding was heavier and she noticed some clots coming out of her nose. She also reports that she was vomiting clots and feeling some things going down her throat. The patient reports her blood pressure yesterday was 160/90 and her blood pressure tonight was 180 over 100s. The patient does not take blood pressure medicines but reports that she has had high blood pressure in the past. The patient reports that she is supposed to take aspirin daily but last took it 2 days ago. The patient also reports that she has not been sleeping because her husband works second shift. Patient reports she is very anxious and nervous because of this nose bleed.   Past Medical History  Diagnosis Date  . Hypertension   . Stroke    COPD Migraines  There are no active problems to display for this patient.   History reviewed. No pertinent past surgical history.  Current Outpatient Rx  Name  Route  Sig  Dispense  Refill  . albuterol (PROVENTIL HFA;VENTOLIN HFA) 108 (90 BASE) MCG/ACT inhaler   Inhalation   Inhale 2  puffs into the lungs every 6 (six) hours as needed for wheezing or shortness of breath.   1 Inhaler   2   . guaiFENesin-codeine 100-10 MG/5ML syrup   Oral   Take 5 mLs by mouth 3 (three) times daily as needed for cough.   120 mL   0   . ibuprofen (ADVIL,MOTRIN) 800 MG tablet   Oral   Take 1 tablet (800 mg total) by mouth every 8 (eight) hours as needed.   30 tablet   0   . cephALEXin (KEFLEX) 500 MG capsule   Oral   Take 1 capsule (500 mg total) by mouth 4 (four) times daily.   20 capsule   0   . cyclobenzaprine (FLEXERIL) 10 MG tablet   Oral   Take 1 tablet (10 mg total) by mouth every 8 (eight) hours as needed for muscle spasms.   15 tablet   0   . cyclobenzaprine (FLEXERIL) 5 MG tablet   Oral   Take 1 tablet (5 mg total) by mouth every 8 (eight) hours as needed for muscle spasms.   20 tablet   0   . hydrochlorothiazide (HYDRODIURIL) 25 MG tablet   Oral   Take 1 tablet (25 mg total) by mouth daily.   30 tablet   0   . HYDROcodone-acetaminophen (NORCO) 5-325 MG per tablet   Oral   Take 1 tablet by mouth every 6 (six) hours as needed for moderate  pain.   12 tablet   0     Allergies Review of patient's allergies indicates no known allergies.  No family history on file.  Social History Social History  Substance Use Topics  . Smoking status: Current Every Day Smoker    Types: Cigarettes  . Smokeless tobacco: None  . Alcohol Use: Yes    Review of Systems Constitutional: No fever/chills Eyes: No visual changes. ENT: Blood coming from right nostril Cardiovascular: Denies chest pain. Respiratory: Denies shortness of breath. Gastrointestinal: No abdominal pain.  No nausea, no vomiting.  No diarrhea.  No constipation. Genitourinary: Negative for dysuria. Musculoskeletal: Negative for back pain. Skin: Negative for rash. Neurological: Headache and dizziness  10-point ROS otherwise negative.  ____________________________________________   PHYSICAL  EXAM:  VITAL SIGNS: ED Triage Vitals  Enc Vitals Group     BP 02/06/15 2303 167/106 mmHg     Pulse Rate 02/06/15 2303 105     Resp 02/06/15 2303 18     Temp 02/06/15 2303 98.4 F (36.9 C)     Temp Source 02/06/15 2303 Oral     SpO2 02/06/15 2303 100 %     Weight 02/06/15 2303 179 lb (81.194 kg)     Height 02/06/15 2303 5\' 1"  (1.549 m)     Head Cir --      Peak Flow --      Pain Score 02/06/15 2304 10     Pain Loc --      Pain Edu? --      Excl. in [place]? --     Constitutional: Alert and oriented. Well appearing and in moderate distress. Eyes: Conjunctivae are normal. PERRL. EOMI. Head: Atraumatic. Nose: Nasal clip in place with some blood coming from right nostril, bleeding continued when clip removed. Mouth/Throat: Mucous membranes are moist.  Blood in posterior oropharynx Cardiovascular: Normal rate, regular rhythm. Grossly normal heart sounds.  Good peripheral circulation. Respiratory: Normal respiratory effort.  No retractions. Lungs CTAB. Gastrointestinal: Soft and nontender. No distention. Positive bowel sounds Musculoskeletal: No lower extremity tenderness nor edema.  No joint effusions. Neurologic:  Normal speech and language.  Skin:  Skin is warm, dry and intact.  Psychiatric: Mood and affect are normal.   ____________________________________________   LABS (all labs ordered are listed, but only abnormal results are displayed)  Labs Reviewed  CBC - Abnormal; Notable for the following:    Hemoglobin 11.8 (*)    All other components within normal limits   ____________________________________________  EKG  None ____________________________________________  RADIOLOGY  None ____________________________________________   PROCEDURES  Procedure(s) performed: please, see procedure note(s).  The patient had packing placed in her right nostril The procedure occurred at approximately 0025 Merocel was used to pack the patient's right nostril. After the packing  was placed the patient had no further bleeding from her nose.  Critical Care performed: No  ____________________________________________   INITIAL IMPRESSION / ASSESSMENT AND PLAN / ED COURSE  Pertinent labs & imaging results that were available during my care of the patient were reviewed by me and considered in my medical decision making (see chart for details).  This is a 49 year old female who comes in with epistaxis and hypertension this evening. Given the 2 times that the patient has had this episode of epistaxis and had continued bleeding despite the nasal clip in place I will place packing in the patient's right nostril. I did use some benzocaine spray to help numb the patient's nose and decreased the discomfort of placing a  Merocel packing.  The patient received some Ativan, Zofran and a liter of normal saline and hydrochlorothiazide for her blood pressure. The patient also reports that she had a headaches I did give her dose of Fioricet. The patient will be discharged home with some Keflex and follow-up with ENT to have her packing removed. ____________________________________________   FINAL CLINICAL IMPRESSION(S) / ED DIAGNOSES  Final diagnoses:  Epistaxis  Secondary hypertension, unspecified      Loney Hering, MD 02/07/15 [phone]

## 2015-02-07 NOTE — Discharge Instructions (Signed)
Hypertension °Hypertension, commonly called high blood pressure, is when the force of blood pumping through your arteries is too strong. Your arteries are the blood vessels that carry blood from your heart throughout your body. A blood pressure reading consists of a higher number over a lower number, such as 110/72. The higher number (systolic) is the pressure inside your arteries when your heart pumps. The lower number (diastolic) is the pressure inside your arteries when your heart relaxes. Ideally you want your blood pressure below 120/80. °Hypertension forces your heart to work harder to pump blood. Your arteries may become narrow or stiff. Having hypertension puts you at risk for heart disease, stroke, and other problems.  °RISK FACTORS °Some risk factors for high blood pressure are controllable. Others are not.  °Risk factors you cannot control include:  °· Race. You may be at higher risk if you are African American. °· Age. Risk increases with age. °· Gender. Men are at higher risk than women before age 45 years. After age 65, women are at higher risk than men. °Risk factors you can control include: °· Not getting enough exercise or physical activity. °· Being overweight. °· Getting too much fat, sugar, calories, or salt in your diet. °· Drinking too much alcohol. °SIGNS AND SYMPTOMS °Hypertension does not usually cause signs or symptoms. Extremely high blood pressure (hypertensive crisis) may cause headache, anxiety, shortness of breath, and nosebleed. °DIAGNOSIS  °To check if you have hypertension, your health care provider will measure your blood pressure while you are seated, with your arm held at the level of your heart. It should be measured at least twice using the same arm. Certain conditions can cause a difference in blood pressure between your right and left arms. A blood pressure reading that is higher than normal on one occasion does not mean that you need treatment. If one blood pressure reading  is high, ask your health care provider about having it checked again. °TREATMENT  °Treating high blood pressure includes making lifestyle changes and possibly taking medicine. Living a healthy lifestyle can help lower high blood pressure. You may need to change some of your habits. °Lifestyle changes may include: °· Following the DASH diet. This diet is high in fruits, vegetables, and whole grains. It is low in salt, red meat, and added sugars. °· Getting at least 2½ hours of brisk physical activity every week. °· Losing weight if necessary. °· Not smoking. °· Limiting alcoholic beverages. °· Learning ways to reduce stress. ° If lifestyle changes are not enough to get your blood pressure under control, your health care provider may prescribe medicine. You may need to take more than one. Work closely with your health care provider to understand the risks and benefits. °HOME CARE INSTRUCTIONS °· Have your blood pressure rechecked as directed by your health care provider.   °· Take medicines only as directed by your health care provider. Follow the directions carefully. Blood pressure medicines must be taken as prescribed. The medicine does not work as well when you skip doses. Skipping doses also puts you at risk for problems.   °· Do not smoke.   °· Monitor your blood pressure at home as directed by your health care provider.  °SEEK MEDICAL CARE IF:  °· You think you are having a reaction to medicines taken. °· You have recurrent headaches or feel dizzy. °· You have swelling in your ankles. °· You have trouble with your vision. °SEEK IMMEDIATE MEDICAL CARE IF: °· You develop a severe headache or confusion. °·   You have unusual weakness, numbness, or feel faint. °· You have severe chest or abdominal pain. °· You vomit repeatedly. °· You have trouble breathing. °MAKE SURE YOU:  °· Understand these instructions. °· Will watch your condition. °· Will get help right away if you are not doing well or get worse. °Document  Released: 06/10/2005 Document Revised: 10/25/2013 Document Reviewed: 04/02/2013 °[employer]® Patient Information ©2015 [employer]. This information is not intended to replace advice given to you by your health care provider. Make sure you discuss any questions you have with your health care provider. °Nosebleed °Nosebleeds can be caused by many conditions, including trauma, infections, polyps, foreign bodies, dry mucous membranes or climate, medicines, and air conditioning. Most nosebleeds occur in the front of the nose. Because of this location, most nosebleeds can be controlled by pinching the nostrils gently and continuously for at least 10 to 20 minutes. The long, continuous pressure allows enough time for the blood to clot. If pressure is released during that 10 to 20 minute time period, the process may have to be started again. The nosebleed may stop by itself or quit with pressure, or it may need concentrated heating (cautery) or pressure from packing. °HOME CARE INSTRUCTIONS  °· If your nose was packed, try to maintain the pack inside until your health care provider removes it. If a gauze pack was used and it starts to fall out, gently replace it or cut the end off. Do not cut if a balloon catheter was used to pack the nose. Otherwise, do not remove unless instructed. °· Avoid blowing your nose for 12 hours after treatment. This could dislodge the pack or clot and start the bleeding again. °· If the bleeding starts again, sit up and bend forward, gently pinching the front half of your nose continuously for 20 minutes. °· If bleeding was caused by dry mucous membranes, use over-the-counter saline nasal spray or gel. This will keep the mucous membranes moist and allow them to heal. If you must use a lubricant, choose the water-soluble variety. Use it only sparingly and not within several hours of lying down. °· Do not use petroleum jelly or mineral oil, as these may drip into the lungs and cause serious  problems. °· Maintain humidity in your home by using less air conditioning or by using a humidifier. °· Do not use aspirin or medicines which make bleeding more likely. Your health care provider can give you recommendations on this. °· Resume normal activities as you are able, but try to avoid straining, lifting, or bending at the waist for several days. °· If the nosebleeds become recurrent and the cause is unknown, your health care provider may suggest laboratory tests. °SEEK MEDICAL CARE IF: °You have a fever. °SEEK IMMEDIATE MEDICAL CARE IF:  °· Bleeding recurs and cannot be controlled. °· There is unusual bleeding from or bruising on other parts of the body. °· Nosebleeds continue. °· There is any worsening of the condition which originally brought you in. °· You become light-headed, feel faint, become sweaty, or vomit blood. °MAKE SURE YOU:  °· Understand these instructions. °· Will watch your condition. °· Will get help right away if you are not doing well or get worse. °Document Released: 03/20/2005 Document Revised: 10/25/2013 Document Reviewed: 05/11/2009 °ExitCare® Patient Information ©2015 [employer]. This information is not intended to replace advice given to you by your health care provider. Make sure you discuss any questions you have with your health care provider. ° °

## 2015-02-07 NOTE — ED Notes (Signed)
Patient is resting comfortably. 

## 2015-02-17 ENCOUNTER — Ambulatory Visit: Payer: Self-pay

## 2015-03-07 ENCOUNTER — Ambulatory Visit: Payer: Self-pay

## 2015-03-07 LAB — BASIC METABOLIC PANEL
BUN: 17 mg/dL (ref 4–21)
CREATININE: 0.7 mg/dL (ref 0.5–1.1)
Glucose: 84 mg/dL
Potassium: 4.4 mmol/L (ref 3.4–5.3)
SODIUM: 140 mmol/L (ref 137–147)

## 2015-03-07 LAB — HEPATIC FUNCTION PANEL
ALK PHOS: 60 U/L (ref 25–125)
ALT: 20 U/L (ref 7–35)
AST: 17 U/L (ref 13–35)
Bilirubin, Total: 0.2 mg/dL

## 2015-03-07 LAB — CBC AND DIFFERENTIAL
HCT: 36 % (ref 36–46)
Hemoglobin: 12 g/dL (ref 12.0–16.0)
Neutrophils Absolute: 2 /uL
PLATELETS: 357 10*3/uL (ref 150–399)
WBC: 6 10^3/mL

## 2015-03-07 LAB — LIPID PANEL
Cholesterol: 332 mg/dL — AB (ref 0–200)
HDL: 49 mg/dL (ref 35–70)
LDL Cholesterol: 212 mg/dL
TRIGLYCERIDES: 355 mg/dL — AB (ref 40–160)

## 2015-03-07 LAB — TSH: TSH: 1.56 u[IU]/mL (ref 0.41–5.90)

## 2015-03-07 LAB — HEMOGLOBIN A1C: Hemoglobin A1C: 6.2

## 2015-03-15 ENCOUNTER — Ambulatory Visit: Payer: Self-pay | Admitting: Ophthalmology

## 2015-03-16 ENCOUNTER — Ambulatory Visit: Payer: Self-pay

## 2015-03-22 ENCOUNTER — Institutional Professional Consult (permissible substitution): Payer: Self-pay | Admitting: Specialist

## 2015-03-23 ENCOUNTER — Ambulatory Visit: Payer: Self-pay

## 2015-03-29 ENCOUNTER — Ambulatory Visit: Payer: Self-pay | Admitting: Ophthalmology

## 2015-04-07 ENCOUNTER — Emergency Department: Payer: Self-pay

## 2015-04-07 ENCOUNTER — Observation Stay
Admission: EM | Admit: 2015-04-07 | Discharge: 2015-04-10 | Disposition: A | Discharge status: Home / Self Care | Payer: Self-pay | Attending: Internal Medicine | Admitting: Internal Medicine

## 2015-04-07 DIAGNOSIS — Z79899 Other long term (current) drug therapy: Secondary | ICD-10-CM | POA: Insufficient documentation

## 2015-04-07 DIAGNOSIS — R42 Dizziness and giddiness: Secondary | ICD-10-CM | POA: Insufficient documentation

## 2015-04-07 DIAGNOSIS — R110 Nausea: Secondary | ICD-10-CM | POA: Insufficient documentation

## 2015-04-07 DIAGNOSIS — Z7951 Long term (current) use of inhaled steroids: Secondary | ICD-10-CM | POA: Insufficient documentation

## 2015-04-07 DIAGNOSIS — I639 Cerebral infarction, unspecified: Secondary | ICD-10-CM

## 2015-04-07 DIAGNOSIS — F419 Anxiety disorder, unspecified: Secondary | ICD-10-CM | POA: Insufficient documentation

## 2015-04-07 DIAGNOSIS — R29898 Other symptoms and signs involving the musculoskeletal system: Secondary | ICD-10-CM

## 2015-04-07 DIAGNOSIS — Z8249 Family history of ischemic heart disease and other diseases of the circulatory system: Secondary | ICD-10-CM | POA: Insufficient documentation

## 2015-04-07 DIAGNOSIS — G43909 Migraine, unspecified, not intractable, without status migrainosus: Secondary | ICD-10-CM | POA: Insufficient documentation

## 2015-04-07 DIAGNOSIS — Z823 Family history of stroke: Secondary | ICD-10-CM | POA: Insufficient documentation

## 2015-04-07 DIAGNOSIS — R531 Weakness: Secondary | ICD-10-CM | POA: Diagnosis present

## 2015-04-07 DIAGNOSIS — E785 Hyperlipidemia, unspecified: Secondary | ICD-10-CM | POA: Insufficient documentation

## 2015-04-07 DIAGNOSIS — R05 Cough: Secondary | ICD-10-CM | POA: Insufficient documentation

## 2015-04-07 DIAGNOSIS — R410 Disorientation, unspecified: Secondary | ICD-10-CM | POA: Insufficient documentation

## 2015-04-07 DIAGNOSIS — Z8673 Personal history of transient ischemic attack (TIA), and cerebral infarction without residual deficits: Secondary | ICD-10-CM | POA: Insufficient documentation

## 2015-04-07 DIAGNOSIS — J329 Chronic sinusitis, unspecified: Secondary | ICD-10-CM | POA: Insufficient documentation

## 2015-04-07 DIAGNOSIS — F17210 Nicotine dependence, cigarettes, uncomplicated: Secondary | ICD-10-CM | POA: Insufficient documentation

## 2015-04-07 DIAGNOSIS — H8110 Benign paroxysmal vertigo, unspecified ear: Principal | ICD-10-CM | POA: Insufficient documentation

## 2015-04-07 DIAGNOSIS — Z23 Encounter for immunization: Secondary | ICD-10-CM | POA: Insufficient documentation

## 2015-04-07 DIAGNOSIS — R600 Localized edema: Secondary | ICD-10-CM | POA: Insufficient documentation

## 2015-04-07 DIAGNOSIS — I10 Essential (primary) hypertension: Secondary | ICD-10-CM | POA: Insufficient documentation

## 2015-04-07 HISTORY — DX: Headache, unspecified: R51.9

## 2015-04-07 HISTORY — DX: Headache: R51

## 2015-04-07 LAB — BASIC METABOLIC PANEL
ANION GAP: 9 (ref 5–15)
BUN: 18 mg/dL (ref 6–20)
CHLORIDE: 103 mmol/L (ref 101–111)
CO2: 29 mmol/L (ref 22–32)
Calcium: 9.8 mg/dL (ref 8.9–10.3)
Creatinine, Ser: 0.68 mg/dL (ref 0.44–1.00)
GFR calc Af Amer: >60 mL/min (ref >60)
GFR calc non Af Amer: >60 mL/min (ref >60)
GLUCOSE: 110 mg/dL — AB (ref 65–99)
POTASSIUM: 3.6 mmol/L (ref 3.5–5.1)
Sodium: 141 mmol/L (ref 135–145)

## 2015-04-07 LAB — TROPONIN I
Troponin I: <0.03 ng/mL (ref <0.031)
Troponin I: <0.03 ng/mL (ref <0.031)

## 2015-04-07 LAB — URINALYSIS COMPLETE WITH MICROSCOPIC (ARMC ONLY)
BILIRUBIN URINE: NEGATIVE
Glucose, UA: NEGATIVE mg/dL
Hgb urine dipstick: NEGATIVE
KETONES UR: NEGATIVE mg/dL
Leukocytes, UA: NEGATIVE
NITRITE: NEGATIVE
Protein, ur: NEGATIVE mg/dL
Specific Gravity, Urine: 1.016 (ref 1.005–1.030)
pH: 6 (ref 5.0–8.0)

## 2015-04-07 LAB — CBC WITH DIFFERENTIAL/PLATELET
BASOS ABS: 0 10*3/uL (ref 0–0.1)
Basophils Relative: 1 %
Eosinophils Absolute: 0.1 10*3/uL (ref 0–0.7)
Eosinophils Relative: 2 %
HEMATOCRIT: 37.4 % (ref 35.0–47.0)
HEMOGLOBIN: 12 g/dL (ref 12.0–16.0)
LYMPHS PCT: 45 %
Lymphs Abs: 2.2 10*3/uL (ref 1.0–3.6)
MCH: 27.6 pg (ref 26.0–34.0)
MCHC: 32.1 g/dL (ref 32.0–36.0)
MCV: 86 fL (ref 80.0–100.0)
MONO ABS: 0.4 10*3/uL (ref 0.2–0.9)
Monocytes Relative: 8 %
NEUTROS ABS: 2.3 10*3/uL (ref 1.4–6.5)
NEUTROS PCT: 44 %
Platelets: 296 10*3/uL (ref 150–440)
RBC: 4.35 MIL/uL (ref 3.80–5.20)
RDW: 13.5 % (ref 11.5–14.5)
WBC: 5.1 10*3/uL (ref 3.6–11.0)

## 2015-04-07 LAB — PROTIME-INR
INR: 0.89
PROTHROMBIN TIME: 12.3 s (ref 11.4–15.0)

## 2015-04-07 LAB — GLUCOSE, CAPILLARY: GLUCOSE-CAPILLARY: 137 mg/dL — AB (ref 65–99)

## 2015-04-07 LAB — APTT: APTT: 29 s (ref 24–36)

## 2015-04-07 MED ORDER — MECLIZINE HCL 25 MG PO TABS
25.0000 mg | ORAL_TABLET | Freq: Once | ORAL | Status: AC
  Administered 2015-04-07: 25 mg via ORAL
  Filled 2015-04-07 (×2): qty 1

## 2015-04-07 MED ORDER — DIAZEPAM 5 MG/ML IJ SOLN
5.0000 mg | Freq: Once | INTRAMUSCULAR | Status: AC
  Administered 2015-04-07: 5 mg via INTRAVENOUS
  Filled 2015-04-07: qty 2

## 2015-04-07 MED ORDER — ACETAMINOPHEN 325 MG PO TABS
650.0000 mg | ORAL_TABLET | Freq: Four times a day (QID) | ORAL | Status: DC | PRN
  Administered 2015-04-08 – 2015-04-09 (×2): 650 mg via ORAL
  Filled 2015-04-07 (×2): qty 2

## 2015-04-07 MED ORDER — GUAIFENESIN-CODEINE 100-10 MG/5ML PO SOLN: 5.0000 mL | Freq: Three times a day (TID) | ORAL | Status: DC | PRN

## 2015-04-07 MED ORDER — ONDANSETRON HCL 4 MG PO TABS
4.0000 mg | ORAL_TABLET | Freq: Once | ORAL | Status: AC
  Administered 2015-04-07: 4 mg via ORAL
  Filled 2015-04-07 (×2): qty 1

## 2015-04-07 MED ORDER — HYDROCODONE-ACETAMINOPHEN 5-325 MG PO TABS
1.0000 | ORAL_TABLET | Freq: Four times a day (QID) | ORAL | Status: DC | PRN
  Administered 2015-04-07 – 2015-04-09 (×4): 1 via ORAL
  Filled 2015-04-07 (×4): qty 1

## 2015-04-07 MED ORDER — IOHEXOL 350 MG/ML SOLN
100.0000 mL | Freq: Once | INTRAVENOUS | Status: AC | PRN
  Administered 2015-04-07: 100 mL via INTRAVENOUS

## 2015-04-07 MED ORDER — HEPARIN SODIUM (PORCINE) 5000 UNIT/ML IJ SOLN
5000.0000 [IU] | Freq: Three times a day (TID) | INTRAMUSCULAR | Status: DC
  Administered 2015-04-07 – 2015-04-10 (×8): 5000 [IU] via SUBCUTANEOUS
  Filled 2015-04-07 (×8): qty 1

## 2015-04-07 MED ORDER — SODIUM CHLORIDE 0.9 % IV BOLUS (SEPSIS)
1000.0000 mL | Freq: Once | INTRAVENOUS | Status: AC
  Administered 2015-04-07: 1000 mL via INTRAVENOUS

## 2015-04-07 MED ORDER — CYCLOBENZAPRINE HCL 10 MG PO TABS: 10.0000 mg | ORAL_TABLET | Freq: Three times a day (TID) | ORAL | Status: DC | PRN

## 2015-04-07 MED ORDER — INFLUENZA VAC SPLIT QUAD 0.5 ML IM SUSY
0.5000 mL | PREFILLED_SYRINGE | INTRAMUSCULAR | Status: AC
  Administered 2015-04-08: 0.5 mL via INTRAMUSCULAR
  Filled 2015-04-07: qty 0.5

## 2015-04-07 MED ORDER — ASPIRIN EC 325 MG PO TBEC
325.0000 mg | DELAYED_RELEASE_TABLET | Freq: Once | ORAL | Status: AC
  Administered 2015-04-07: 325 mg via ORAL
  Filled 2015-04-07: qty 1

## 2015-04-07 MED ORDER — STROKE: EARLY STAGES OF RECOVERY BOOK
Freq: Once | Status: AC
  Administered 2015-04-07: 21:00:00

## 2015-04-07 MED ORDER — ALBUTEROL SULFATE (2.5 MG/3ML) 0.083% IN NEBU: 2.5000 mg | INHALATION_SOLUTION | Freq: Four times a day (QID) | RESPIRATORY_TRACT | Status: DC | PRN

## 2015-04-07 MED ORDER — IBUPROFEN 400 MG PO TABS: 800.0000 mg | ORAL_TABLET | Freq: Three times a day (TID) | ORAL | Status: DC | PRN

## 2015-04-07 NOTE — ED Notes (Signed)
Pt states dizziness since yesterday, states some nausea, denies any headache, states some left sided earache, pt unsteady when walking to room

## 2015-04-07 NOTE — ED Notes (Signed)
Patient comes in from home with complaints of dizziness and blurred vision that started yesterday around 5:30am.

## 2015-04-07 NOTE — H&P (Signed)
[facility] [place] NAME: Jocelyn Simon    MR#:  [id number]  DATE OF BIRTH:  1966-02-21  DATE OF ADMISSION:  04/07/2015  PRIMARY CARE PHYSICIAN: No PCP Per Patient   REQUESTING/REFERRING PHYSICIAN: Dr. Mariea Clonts   CHIEF COMPLAINT:   Chief Complaint  Patient presents with  . Dizziness    HISTORY OF PRESENT ILLNESS:  Jocelyn Simon  is a 49 y.o. female with a known history of hypertension and migraines presents with 24 hours of dizziness and left-sided weakness. She reports that yesterday morning in the shower she had acute onset of dizziness with the room spinning nausea no vomiting. She also had headache with photophobia. She proceeded to go to work and was able to perform her job. After work she stop by [facility] where she was told by the pharmacist that her blood pressure was somewhat elevated. Her husband reports she has also had some confusion and anxiety overnight but no slurred speech. Today she presents due to persistent dizziness and also left-sided weakness concern of fall.   PAST MEDICAL HISTORY:   Past Medical History  Diagnosis Date  . Hypertension   . Stroke ([place])   . Headache     PAST SURGICAL HISTORY:  History reviewed. No pertinent past surgical history.  SOCIAL HISTORY:   Social History  Substance Use Topics  . Smoking status: Current Every Day Smoker -- 0.25 packs/day    Types: Cigarettes  . Smokeless tobacco: Not on file  . Alcohol Use: No    FAMILY HISTORY:   Family History  Problem Relation Age of Onset  . CAD Mother   . CAD Sister   . CVA Other     DRUG ALLERGIES:  No Known Allergies  REVIEW OF SYSTEMS:   Review of Systems  Constitutional: Positive for chills and malaise/fatigue. Negative for fever and weight loss.  HENT: Negative for congestion and hearing loss.   Eyes: Positive for blurred vision and photophobia. Negative for pain.  Respiratory: Negative for cough, hemoptysis, sputum  production, shortness of breath and stridor.   Cardiovascular: Negative for chest pain, palpitations, orthopnea and leg swelling.  Gastrointestinal: Positive for nausea. Negative for vomiting, abdominal pain, diarrhea, constipation and blood in stool.  Genitourinary: Negative for dysuria and frequency.  Musculoskeletal: Negative for myalgias, back pain, joint pain and neck pain.  Skin: Negative for rash.  Neurological: Positive for dizziness, sensory change, focal weakness and headaches. Negative for loss of consciousness.  Endo/Heme/Allergies: Does not bruise/bleed easily.  Psychiatric/Behavioral: Negative for depression and hallucinations. The patient is nervous/anxious.     MEDICATIONS AT HOME:   Prior to Admission medications   Medication Sig Start Date End Date Taking? Authorizing Provider  albuterol (PROVENTIL HFA;VENTOLIN HFA) 108 (90 BASE) MCG/ACT inhaler Inhale 2 puffs into the lungs every 6 (six) hours as needed for wheezing or shortness of breath. 11/11/14  Yes Harvest Dark, MD  cyclobenzaprine (FLEXERIL) 10 MG tablet Take 1 tablet (10 mg total) by mouth every 8 (eight) hours as needed for muscle spasms. 11/29/14 11/29/15 Yes III William C Ruffian, PA-C  guaiFENesin-codeine 100-10 MG/5ML syrup Take 5 mLs by mouth 3 (three) times daily as needed for cough. 11/11/14  Yes Harvest Dark, MD  hydrochlorothiazide (HYDRODIURIL) 25 MG tablet Take 1 tablet (25 mg total) by mouth daily. 02/07/15  Yes Loney Hering, MD  HYDROcodone-acetaminophen (NORCO) 5-325 MG per tablet Take 1 tablet by mouth every 6 (six) hours as needed for moderate pain.  11/29/14  Yes III Luanna Cole Ruffian, PA-C  ibuprofen (ADVIL,MOTRIN) 800 MG tablet Take 1 tablet (800 mg total) by mouth every 8 (eight) hours as needed. Patient taking differently: Take 800 mg by mouth every 8 (eight) hours as needed for mild pain.  11/29/14  Yes III Luanna Cole Ruffian, PA-C  cyclobenzaprine (FLEXERIL) 5 MG tablet Take 1 tablet (5 mg  total) by mouth every 8 (eight) hours as needed for muscle spasms. Patient not taking: Reported on 04/07/2015 11/11/14 11/11/15  Harvest Dark, MD      VITAL SIGNS:  Blood pressure 132/87, pulse 88, temperature 98.4 F (36.9 C), temperature source Oral, resp. rate 16, height 5\' 1"  (1.549 m), weight 81.647 kg (180 lb), SpO2 99 %.  PHYSICAL EXAMINATION:  GENERAL:  49 y.o.-year-old patient lying in the bed, Anxious, tearful  EYES: Pupils equal, round, reactive to light and accommodation. No scleral icterus. Extraocular muscles intact.  HEENT: Head atraumatic, normocephalic. Oropharynx and nasopharynx clear. Oral mucous membranes pink and moist, poor dentition  NECK:  Supple, no jugular venous distention. No thyroid enlargement, no tenderness.  LUNGS: Normal breath sounds bilaterally, no wheezing, rales,rhonchi or crepitation. No use of accessory muscles of respiration.  CARDIOVASCULAR: S1, S2 normal. No murmurs, rubs, or gallops.  ABDOMEN: Soft, nontender, nondistended. Bowel sounds present. No organomegaly or mass.  EXTREMITIES: No pedal edema, cyanosis, or clubbing.  NEUROLOGIC: Cranial nerves II through XII are intact. Strength left upper extremity 4+ out of 5, strength left lower extremity is 4 out of 5 with no plantar flexion strength at all, sensation is intact  PSYCHIATRIC: The patient is alert and oriented x 3.Tearful, anxious  SKIN: No obvious rash, lesion, or ulcer.   LABORATORY PANEL:   CBC  Recent Labs Lab 04/07/15 1331  WBC 5.1  HGB 12.0  HCT 37.4  PLT 296   ------------------------------------------------------------------------------------------------------------------  Chemistries   Recent Labs Lab 04/07/15 1331  NA 141  K 3.6  CL 103  CO2 29  GLUCOSE 110*  BUN 18  CREATININE 0.68  CALCIUM 9.8   ------------------------------------------------------------------------------------------------------------------  Cardiac Enzymes  Recent Labs Lab  04/07/15 1331  TROPONINI <0.03   ------------------------------------------------------------------------------------------------------------------  RADIOLOGY:  Ct Angio Head W/cm &/or Wo Cm  04/07/2015  CLINICAL DATA:  Dizziness and blurred vision starting yesterday. EXAM: CT ANGIOGRAPHY HEAD AND NECK TECHNIQUE: Multidetector CT imaging of the head and neck was performed using the standard protocol during bolus administration of intravenous contrast. Multiplanar CT image reconstructions and MIPs were obtained to evaluate the vascular anatomy. Carotid stenosis measurements (when applicable) are obtained utilizing NASCET criteria, using the distal internal carotid diameter as the denominator. CONTRAST:  178mL OMNIPAQUE IOHEXOL 350 MG/ML SOLN COMPARISON:  09/03/2013 FINDINGS: CT HEAD Skull and Sinuses:Negative for fracture or destructive process. The mastoids, middle ears, and imaged paranasal sinuses are clear. Orbits: No acute abnormality. Brain: No evidence of acute infarction, hemorrhage, hydrocephalus, or mass lesion/mass effect. CTA NECK Aortic arch: No aneurysm or dissection.  Three vessel branching. Right carotid system: Tiny mural calcification at the bifurcation compatible with early atherosclerosis. No stenosis, dissection, or evidence of vasculopathy. Left carotid system: Flat appearance of the posterior lumen proximal left ICA which is likely from early noncalcified atheromatous plaque. No stenosis, dissection, or evidence of vasculopathy. Vertebral arteries:Proximal subclavian arteries are widely patent. Mild left vertebral artery dominance. Patent vertebral arteries to the dura. No evidence of dissection. Skeleton: Negative Other neck: No incidentally detected mass or adenopathy in the neck. CTA HEAD Limited by venous contamination.  Anterior circulation: Mild atheromatous calcification on the carotid siphon walls bilaterally. There is no evidence of major branch occlusion. No flow limiting  stenosis. Conical outpouching from the right supraclinoid carotid artery which extends posteriorly and inferiorly, 3.6 mm in length. No emanating vessel is seen from the tip. Posterior circulation: Mild left vertebral artery dominance. Symmetric vertebral and basilar branching. No occlusion or stenosis to explain symptoms. Negative for aneurysm. Venous sinuses: Patent Anatomic variants: None significant Delayed phase: No parenchymal enhancement. IMPRESSION: 1. No acute finding or explanation for dizziness. 2. Minimal atherosclerosis. 3. 3.6 mm outpouching from the right supraclinoid carotid artery. Morphology favors infundibulum over aneurysm but at this size surveillance is still advised. Electronically Signed   By: Monte Fantasia M.D.   On: 04/07/2015 16:02   Ct Angio Neck W/cm &/or Wo/cm  04/07/2015  CLINICAL DATA:  Dizziness and blurred vision starting yesterday. EXAM: CT ANGIOGRAPHY HEAD AND NECK TECHNIQUE: Multidetector CT imaging of the head and neck was performed using the standard protocol during bolus administration of intravenous contrast. Multiplanar CT image reconstructions and MIPs were obtained to evaluate the vascular anatomy. Carotid stenosis measurements (when applicable) are obtained utilizing NASCET criteria, using the distal internal carotid diameter as the denominator. CONTRAST:  153mL OMNIPAQUE IOHEXOL 350 MG/ML SOLN COMPARISON:  09/03/2013 FINDINGS: CT HEAD Skull and Sinuses:Negative for fracture or destructive process. The mastoids, middle ears, and imaged paranasal sinuses are clear. Orbits: No acute abnormality. Brain: No evidence of acute infarction, hemorrhage, hydrocephalus, or mass lesion/mass effect. CTA NECK Aortic arch: No aneurysm or dissection.  Three vessel branching. Right carotid system: Tiny mural calcification at the bifurcation compatible with early atherosclerosis. No stenosis, dissection, or evidence of vasculopathy. Left carotid system: Flat appearance of the  posterior lumen proximal left ICA which is likely from early noncalcified atheromatous plaque. No stenosis, dissection, or evidence of vasculopathy. Vertebral arteries:Proximal subclavian arteries are widely patent. Mild left vertebral artery dominance. Patent vertebral arteries to the dura. No evidence of dissection. Skeleton: Negative Other neck: No incidentally detected mass or adenopathy in the neck. CTA HEAD Limited by venous contamination. Anterior circulation: Mild atheromatous calcification on the carotid siphon walls bilaterally. There is no evidence of major branch occlusion. No flow limiting stenosis. Conical outpouching from the right supraclinoid carotid artery which extends posteriorly and inferiorly, 3.6 mm in length. No emanating vessel is seen from the tip. Posterior circulation: Mild left vertebral artery dominance. Symmetric vertebral and basilar branching. No occlusion or stenosis to explain symptoms. Negative for aneurysm. Venous sinuses: Patent Anatomic variants: None significant Delayed phase: No parenchymal enhancement. IMPRESSION: 1. No acute finding or explanation for dizziness. 2. Minimal atherosclerosis. 3. 3.6 mm outpouching from the right supraclinoid carotid artery. Morphology favors infundibulum over aneurysm but at this size surveillance is still advised. Electronically Signed   By: Monte Fantasia M.D.   On: 04/07/2015 16:02    EKG:   Orders placed or performed during the hospital encounter of 04/07/15  . EKG 12-Lead  . EKG 12-Lead  . EKG 12-Lead  . EKG 12-Lead    IMPRESSION AND PLAN:   #1 dizziness with left-sided weakness on examination: CT is negative for acute CVA. Will admit for MRI MRA. Monitor on telemetry. Obtain physical therapy consultation. If MRI is positive we'll obtain an echo and Dopplers. Start aspirin daily. Check lipids and hemoglobin A1c.  #2 hypertension: Blood pressure is excellent on admission. Will hold home antihypertensives to allow for  permissive hypertension in the setting of possible stroke.  #  3. Confusion: I suspect this is more likely related to the volume she has just been administered and 2 anxiety overnight. UA is negative for infection. No leukocytosis or fever.  #4 prophylaxis: Heparin for DVT prophylaxis. No GI prophylaxis needed at this time  All the records are reviewed and case discussed with ED provider. Management plans discussed with the patient, family and they are in agreement.I have spoken with the patient's husband and her sister at the bedside  CODE STATUS: Full   TOTAL TIME TAKING CARE OF THIS PATIENT: 45 minutes.  Greater than 50% of time spent in coordination of care and counseling.  Myrtis Ser M.D on 04/07/2015 at 5:10 PM  Between 7am to 6pm - Pager - [phone]  After 6pm go to [email or web address] - password [email or web address] [facility]  [facility]  Office  [phone]  CC: Primary care physician; No PCP Per Patient

## 2015-04-07 NOTE — ED Provider Notes (Signed)
[facility] Emergency Department Provider Note  ____________________________________________  Time seen: Approximately 2:19 PM  I have reviewed the triage vital signs and the nursing notes.   HISTORY  Chief Complaint Dizziness    HPI Jocelyn Simon is a 49 y.o. female , with a history of HTN and multiple TIAs, presenting for dizziness. Patient states that yesterday morning, she developed acute onset of "room spinning" when she was in the shower. Since then, she has had constant dizziness that is worse and she looks to the right or with positional changes. It is also worse when she sits or stands. It improves if she rests. Yesterday, she had a left-sided headache which lasted for a few hours and has completely resolved. She has had some nausea but no vomiting. She denies any associated visual changes, changes in her speech, weakness. She does note that she had tingling in the hands bilaterally. She denies any recent trauma, changes in her medication, illnesses other than a mild cough, diarrhea. No black or tarry-colored looking stools.   Past Medical History  Diagnosis Date  . Hypertension   . Stroke ([place])   . Headache     Patient Active Problem List   Diagnosis Date Noted  . Left-sided weakness 04/07/2015    History reviewed. No pertinent past surgical history.  No current outpatient prescriptions on file.  Allergies Review of patient's allergies indicates no known allergies.  Family History  Problem Relation Age of Onset  . CAD Mother   . CAD Sister   . CVA Other     Social History Social History  Substance Use Topics  . Smoking status: Current Every Day Smoker -- 0.25 packs/day    Types: Cigarettes  . Smokeless tobacco: None  . Alcohol Use: No    Review of Systems Constitutional: No fever/chills. No syncope. Eyes: No visual changes. No blurred or double vision. ENT: No sore throat. Cardiovascular: Denies chest pain,  palpitations. Respiratory: Denies shortness of breath.  No cough. Gastrointestinal: No abdominal pain.  Positive nausea without vomiting.  No diarrhea.  No constipation. Genitourinary: Negative for dysuria. No change in urinary frequency Musculoskeletal: Negative for back pain. Skin: Negative for rash. Neurological: Positive for headache, dizziness. No weakness. Positive and tingling bilaterally.  10-point ROS otherwise negative.  ____________________________________________   PHYSICAL EXAM:  VITAL SIGNS: ED Triage Vitals  Enc Vitals Group     BP 04/07/15 1324 132/87 mmHg     Pulse Rate 04/07/15 1324 88     Resp 04/07/15 1324 16     Temp 04/07/15 1324 98.4 F (36.9 C)     Temp Source 04/07/15 1324 Oral     SpO2 04/07/15 1324 99 %     Weight 04/07/15 1324 180 lb (81.647 kg)     Height 04/07/15 1324 5\' 1"  (1.549 m)     Head Cir --      Peak Flow --      Pain Score --      Pain Loc --      Pain Edu? --      Excl. in [place]? --    \ Constitutional: Alert and oriented. Well appearing and in no acute distress. Answer question appropriately. Eyes: Conjunctivae are normal.  EOMI. PERRLA Head: Atraumatic. Nose: No congestion/rhinnorhea. EARS: Bilateral TMs are clear without fluid, erythema or bulge. Canals are clear as well. Mouth/Throat: Mucous membranes are moist.  Neck: No stridor.  Supple.  No JVD Cardiovascular: Normal rate, regular rhythm. No murmurs, rubs  or gallops.  Respiratory: Normal respiratory effort.  No retractions. Lungs CTAB.  No wheezes, rales or ronchi. Gastrointestinal: Soft and nontender. No distention. No peritoneal signs. Musculoskeletal: No LE edema.  Neurologic: Alert and oriented 3. Speech is clear. Naming and repetition are intact. Face and smile symmetric. EOMI. PERRLA. No nystagmus. Tongue is midline. No pronator drift. 5 out of 5 grip, biceps, triceps, R hip flexor, Bplantar flexion and R dorsiflexion. 4+/5 left hip flexor, left dorsiflexion. Normal  sensation to light touch in the bilateral upper and lower extremities, and face. Normal finger-nose-finger. Patient becomes symptomatic when she sits up and with right lateral gaze. Skin:  Skin is warm, dry and intact. No rash noted. Psychiatric: Mood and affect are normal. Speech and behavior are normal.  Normal judgement.  ____________________________________________   LABS (all labs ordered are listed, but only abnormal results are displayed)  Labs Reviewed  BASIC METABOLIC PANEL - Abnormal; Notable for the following:    Glucose, Bld 110 (*)    All other components within normal limits  GLUCOSE, CAPILLARY - Abnormal; Notable for the following:    Glucose-Capillary 137 (*)    All other components within normal limits  URINALYSIS COMPLETEWITH MICROSCOPIC ([facility] ONLY) - Abnormal; Notable for the following:    Color, Urine YELLOW (*)    APPearance CLEAR (*)    Bacteria, UA MANY (*)    Squamous Epithelial / LPF 0-5 (*)    All other components within normal limits  URINE CULTURE  CBC WITH DIFFERENTIAL/PLATELET  TROPONIN I  APTT  PROTIME-INR  HEMOGLOBIN A1C  LIPID PANEL  POCT CBG (FASTING - GLUCOSE)-MANUAL ENTRY   ____________________________________________  EKG  ED ECG REPORT I, Eula Listen, the attending physician, personally viewed and interpreted this ECG.   Date: 04/07/2015  EKG Time: 1325  Rate: 81  Rhythm: normal sinus rhythm  Axis: Normal  Intervals:first-degree A-V block   ST&T Change: Nonspecific T-wave inversion in V1. No ST elevation or depression.  ____________________________________________  RADIOLOGY  Ct Angio Head W/cm &/or Wo Cm  04/07/2015  CLINICAL DATA:  Dizziness and blurred vision starting yesterday. EXAM: CT ANGIOGRAPHY HEAD AND NECK TECHNIQUE: Multidetector CT imaging of the head and neck was performed using the standard protocol during bolus administration of intravenous contrast. Multiplanar CT image reconstructions and MIPs were  obtained to evaluate the vascular anatomy. Carotid stenosis measurements (when applicable) are obtained utilizing NASCET criteria, using the distal internal carotid diameter as the denominator. CONTRAST:  138mL OMNIPAQUE IOHEXOL 350 MG/ML SOLN COMPARISON:  09/03/2013 FINDINGS: CT HEAD Skull and Sinuses:Negative for fracture or destructive process. The mastoids, middle ears, and imaged paranasal sinuses are clear. Orbits: No acute abnormality. Brain: No evidence of acute infarction, hemorrhage, hydrocephalus, or mass lesion/mass effect. CTA NECK Aortic arch: No aneurysm or dissection.  Three vessel branching. Right carotid system: Tiny mural calcification at the bifurcation compatible with early atherosclerosis. No stenosis, dissection, or evidence of vasculopathy. Left carotid system: Flat appearance of the posterior lumen proximal left ICA which is likely from early noncalcified atheromatous plaque. No stenosis, dissection, or evidence of vasculopathy. Vertebral arteries:Proximal subclavian arteries are widely patent. Mild left vertebral artery dominance. Patent vertebral arteries to the dura. No evidence of dissection. Skeleton: Negative Other neck: No incidentally detected mass or adenopathy in the neck. CTA HEAD Limited by venous contamination. Anterior circulation: Mild atheromatous calcification on the carotid siphon walls bilaterally. There is no evidence of major branch occlusion. No flow limiting stenosis. Conical outpouching from the right supraclinoid carotid  artery which extends posteriorly and inferiorly, 3.6 mm in length. No emanating vessel is seen from the tip. Posterior circulation: Mild left vertebral artery dominance. Symmetric vertebral and basilar branching. No occlusion or stenosis to explain symptoms. Negative for aneurysm. Venous sinuses: Patent Anatomic variants: None significant Delayed phase: No parenchymal enhancement. IMPRESSION: 1. No acute finding or explanation for dizziness. 2.  Minimal atherosclerosis. 3. 3.6 mm outpouching from the right supraclinoid carotid artery. Morphology favors infundibulum over aneurysm but at this size surveillance is still advised. Electronically Signed   By: Monte Fantasia M.D.   On: 04/07/2015 16:02   Ct Angio Neck W/cm &/or Wo/cm  04/07/2015  CLINICAL DATA:  Dizziness and blurred vision starting yesterday. EXAM: CT ANGIOGRAPHY HEAD AND NECK TECHNIQUE: Multidetector CT imaging of the head and neck was performed using the standard protocol during bolus administration of intravenous contrast. Multiplanar CT image reconstructions and MIPs were obtained to evaluate the vascular anatomy. Carotid stenosis measurements (when applicable) are obtained utilizing NASCET criteria, using the distal internal carotid diameter as the denominator. CONTRAST:  144mL OMNIPAQUE IOHEXOL 350 MG/ML SOLN COMPARISON:  09/03/2013 FINDINGS: CT HEAD Skull and Sinuses:Negative for fracture or destructive process. The mastoids, middle ears, and imaged paranasal sinuses are clear. Orbits: No acute abnormality. Brain: No evidence of acute infarction, hemorrhage, hydrocephalus, or mass lesion/mass effect. CTA NECK Aortic arch: No aneurysm or dissection.  Three vessel branching. Right carotid system: Tiny mural calcification at the bifurcation compatible with early atherosclerosis. No stenosis, dissection, or evidence of vasculopathy. Left carotid system: Flat appearance of the posterior lumen proximal left ICA which is likely from early noncalcified atheromatous plaque. No stenosis, dissection, or evidence of vasculopathy. Vertebral arteries:Proximal subclavian arteries are widely patent. Mild left vertebral artery dominance. Patent vertebral arteries to the dura. No evidence of dissection. Skeleton: Negative Other neck: No incidentally detected mass or adenopathy in the neck. CTA HEAD Limited by venous contamination. Anterior circulation: Mild atheromatous calcification on the carotid  siphon walls bilaterally. There is no evidence of major branch occlusion. No flow limiting stenosis. Conical outpouching from the right supraclinoid carotid artery which extends posteriorly and inferiorly, 3.6 mm in length. No emanating vessel is seen from the tip. Posterior circulation: Mild left vertebral artery dominance. Symmetric vertebral and basilar branching. No occlusion or stenosis to explain symptoms. Negative for aneurysm. Venous sinuses: Patent Anatomic variants: None significant Delayed phase: No parenchymal enhancement. IMPRESSION: 1. No acute finding or explanation for dizziness. 2. Minimal atherosclerosis. 3. 3.6 mm outpouching from the right supraclinoid carotid artery. Morphology favors infundibulum over aneurysm but at this size surveillance is still advised. Electronically Signed   By: Monte Fantasia M.D.   On: 04/07/2015 16:02    ____________________________________________   PROCEDURES  Procedure(s) performed: None  Critical Care performed: No ____________________________________________   INITIAL IMPRESSION / ASSESSMENT AND PLAN / ED COURSE  Pertinent labs & imaging results that were available during my care of the patient were reviewed by me and considered in my medical decision making (see chart for details).  49 y.o. female with a history of hypertension and TIA presenting with acute onset of dizziness. Clinically, the patient is very symptomatic with any position changes. Vertigo is to be considered, however she does have is minimal difference between the strength in her left hip flexor and her right hip flexor. She has had TIA in the past but does not know she has had any residual neurologic symptoms. Posterior stroke is also possible. I will treat her with meclizine  but also get CT and CTA of the head and neck. The labs that she has some triage do not show any anemia or other marked electrolyte abnormality. Rule out UTI as well. We will also treat with IV fluids  cases patient is suffering from dehydration.  ----------------------------------------- 3:50 PM on 04/07/2015 -----------------------------------------  The patient was initially feeling slightly better with meclizine. However, since she has moved to CT and has had multiple positional changes, her dizziness has now worsened. She states that she is feeling weak in the left lower extremity, I have repeated her examination is unchanged compared to my exam initially.  ----------------------------------------- 4:16 PM on 04/07/2015 -----------------------------------------  Patient continues to have some vertiginous symptoms. Her CT scan does not show any acute cause for this vertigo but in addition to the vertigo she does have a left lower extremity weakness which she states is new for her. We will plan to keep her at the hospital for stroke evaluation.  ____________________________________________  FINAL CLINICAL IMPRESSION(S) / ED DIAGNOSES  Final diagnoses:  Dizziness  Vertigo  Cerebral infarction due to unspecified mechanism  Weakness of left lower extremity      NEW MEDICATIONS STARTED DURING THIS VISIT:  Current Discharge Medication List       Eula Listen, MD 04/07/15 1905

## 2015-04-08 ENCOUNTER — Observation Stay: Payer: Self-pay

## 2015-04-08 ENCOUNTER — Observation Stay
Admit: 2015-04-08 | Discharge: 2015-04-08 | Disposition: A | Discharge status: Home / Self Care | Payer: Self-pay | Attending: Internal Medicine | Admitting: Internal Medicine

## 2015-04-08 DIAGNOSIS — H8113 Benign paroxysmal vertigo, bilateral: Secondary | ICD-10-CM

## 2015-04-08 LAB — HEMOGLOBIN A1C: Hgb A1c MFr Bld: 5.9 % (ref 4.0–6.0)

## 2015-04-08 LAB — BASIC METABOLIC PANEL
Anion gap: 7 (ref 5–15)
BUN: 17 mg/dL (ref 6–20)
CO2: 28 mmol/L (ref 22–32)
CREATININE: 0.7 mg/dL (ref 0.44–1.00)
Calcium: 9 mg/dL (ref 8.9–10.3)
Chloride: 104 mmol/L (ref 101–111)
GFR calc Af Amer: >60 mL/min (ref >60)
GFR calc non Af Amer: >60 mL/min (ref >60)
Glucose, Bld: 111 mg/dL — ABNORMAL HIGH (ref 65–99)
Potassium: 3.6 mmol/L (ref 3.5–5.1)
SODIUM: 139 mmol/L (ref 135–145)

## 2015-04-08 LAB — CBC
HCT: 33.8 % — ABNORMAL LOW (ref 35.0–47.0)
Hemoglobin: 11.2 g/dL — ABNORMAL LOW (ref 12.0–16.0)
MCH: 28.7 pg (ref 26.0–34.0)
MCHC: 33.3 g/dL (ref 32.0–36.0)
MCV: 86.2 fL (ref 80.0–100.0)
Platelets: 268 10*3/uL (ref 150–440)
RBC: 3.92 MIL/uL (ref 3.80–5.20)
RDW: 13.2 % (ref 11.5–14.5)
WBC: 5.7 10*3/uL (ref 3.6–11.0)

## 2015-04-08 LAB — LIPID PANEL
CHOLESTEROL: 164 mg/dL (ref 0–200)
HDL: 34 mg/dL — AB (ref >40)
LDL Cholesterol: 74 mg/dL (ref 0–99)
TRIGLYCERIDES: 282 mg/dL — AB (ref <150)
Total CHOL/HDL Ratio: 4.8 RATIO
VLDL: 56 mg/dL — AB (ref 0–40)

## 2015-04-08 LAB — TROPONIN I: Troponin I: <0.03 ng/mL

## 2015-04-08 LAB — GLUCOSE, CAPILLARY: GLUCOSE-CAPILLARY: 111 mg/dL — AB (ref 65–99)

## 2015-04-08 MED ORDER — SODIUM CHLORIDE 0.9 % IJ SOLN
3.0000 mL | Freq: Two times a day (BID) | INTRAMUSCULAR | Status: DC
  Administered 2015-04-08 – 2015-04-10 (×7): 3 mL via INTRAVENOUS

## 2015-04-08 MED ORDER — NICOTINE 7 MG/24HR TD PT24
7.0000 mg | MEDICATED_PATCH | Freq: Every day | TRANSDERMAL | Status: DC
  Administered 2015-04-08 – 2015-04-10 (×3): 7 mg via TRANSDERMAL
  Filled 2015-04-08 (×3): qty 1

## 2015-04-08 MED ORDER — MECLIZINE HCL 25 MG PO TABS
25.0000 mg | ORAL_TABLET | Freq: Three times a day (TID) | ORAL | Status: DC | PRN
  Administered 2015-04-08 – 2015-04-09 (×3): 25 mg via ORAL
  Filled 2015-04-08 (×3): qty 1

## 2015-04-08 NOTE — Plan of Care (Signed)
Problem: Discharge/Transitional Outcomes Goal: Other Discharge Outcomes/Goals Outcome: Progressing 1. Pt has had previous stroke with delayed responses to requests and communications with chronic left sided weakness. 2. Pt has been given education and handbook/ handbook at bedside. Verbalizes understanding. 3. VSS with BP beginning to trend borderline elevated. Not orthostatic. Increased dizziness/vertigo with movements/walking. 4. Ambulates with walker with 1+ with pt off balance; unsteady. PT consult in to see pt today. 5.  Tolerating diet well. 6. Family very supportive with husband and other family. Plan is to discharge home with family support. 7. Will make appt upon discharge/states can obtain needed medications.

## 2015-04-08 NOTE — Consult Note (Signed)
CC: dizziness   HPI: Jocelyn Simon is an 49 y.o. female with a known history of hypertension and migraines presents with 24 hours of dizziness and left-sided weakness. She reports that yesterday morning in the shower she had acute onset of dizziness with the room spinning nausea no vomiting. Dizziness is positional with positive [employer].    Past Medical History  Diagnosis Date  . Hypertension   . Stroke ([place])   . Headache     History reviewed. No pertinent past surgical history.  Family History  Problem Relation Age of Onset  . CAD Mother   . CAD Sister   . CVA Other     Social History:  reports that she has been smoking Cigarettes.  She has been smoking about 0.25 packs per day. She does not have any smokeless tobacco history on file. She reports that she does not drink alcohol or use illicit drugs.  No Known Allergies  Medications: I have reviewed the patient's current medications.  ROS: History obtained from the patient  General ROS: negative for - chills, fatigue, fever, night sweats, weight gain or weight loss Psychological ROS: negative for - behavioral disorder, hallucinations, memory difficulties, mood swings or suicidal ideation Ophthalmic ROS: negative for - blurry vision, double vision, eye pain or loss of vision ENT ROS: negative for - epistaxis, nasal discharge, oral lesions, sore throat, tinnitus or vertigo Allergy and Immunology ROS: negative for - hives or itchy/watery eyes Hematological and Lymphatic ROS: negative for - bleeding problems, bruising or swollen lymph nodes Endocrine ROS: negative for - galactorrhea, hair pattern changes, polydipsia/polyuria or temperature intolerance Respiratory ROS: negative for - cough, hemoptysis, shortness of breath or wheezing Cardiovascular ROS: negative for - chest pain, dyspnea on exertion, edema or irregular heartbeat Gastrointestinal ROS: negative for - abdominal pain, diarrhea, hematemesis, nausea/vomiting or  stool incontinence Genito-Urinary ROS: negative for - dysuria, hematuria, incontinence or urinary frequency/urgency Musculoskeletal ROS: negative for - joint swelling or muscular weakness Neurological ROS: as noted in HPI Dermatological ROS: negative for rash and skin lesion changes  Physical Examination: Blood pressure 102/57, pulse 62, temperature 97.8 F (36.6 C), temperature source Oral, resp. rate 18, height 5\' 1"  (1.549 m), weight 79.379 kg (175 lb), SpO2 95 %.   Neurological Examination Mental Status: Alert, oriented, thought content appropriate.  Speech fluent without evidence of aphasia.  Cranial Nerves: II: Discs flat bilaterally; Visual fields grossly normal, pupils equal, round, reactive to light and accommodation III,IV, VI: ptosis not present, extra-ocular motions intact bilaterally V,VII: smile symmetric, facial light touch sensation normal bilaterally VIII: hearing normal bilaterally IX,X: gag reflex present XI: bilateral shoulder shrug XII: midline tongue extension Motor: Right : Upper extremity   4+/5    Left:     Upper extremity   4+/5  Lower extremity   4+/5     Lower extremity   4+/5 Tone and bulk:normal tone throughout; no atrophy noted Sensory: Pinprick and light touch intact throughout, bilaterally Deep Tendon Reflexes: 1+ and symmetric throughout Plantars: Right: downgoing   Left: downgoing Cerebellar: normal finger-to-nose, normal rapid alternating movements and normal heel-to-shin test Gait: not tested      Laboratory Studies:   Basic Metabolic Panel:  Recent Labs Lab 04/07/15 1331 04/08/15 0234  NA 141 139  K 3.6 3.6  CL 103 104  CO2 29 28  GLUCOSE 110* 111*  BUN 18 17  CREATININE 0.68 0.70  CALCIUM 9.8 9.0    Liver Function Tests: No results for input(s):  AST, ALT, ALKPHOS, BILITOT, PROT, ALBUMIN in the last 168 hours. No results for input(s): LIPASE, AMYLASE in the last 168 hours. No results for input(s): AMMONIA in the last 168  hours.  CBC:  Recent Labs Lab 04/07/15 1331 04/08/15 0234  WBC 5.1 5.7  NEUTROABS 2.3  --   HGB 12.0 11.2*  HCT 37.4 33.8*  MCV 86.0 86.2  PLT 296 268    Cardiac Enzymes:  Recent Labs Lab 04/07/15 1331 04/07/15 2124 04/08/15 0234 04/08/15 0845  TROPONINI <0.03 <0.03 <0.03 <0.03    BNP: Invalid input(s): POCBNP  CBG:  Recent Labs Lab 04/07/15 1331  GLUCAP 137*    Microbiology: Results for orders placed or performed during the hospital encounter of 04/07/15  Urine culture     Status: None (Preliminary result)   Collection Time: 04/07/15  2:45 PM  Result Value Ref Range Status   Specimen Description URINE, RANDOM  Final   Special Requests NONE  Final   Culture NO GROWTH < 24 HOURS  Final   Report Status PENDING  Incomplete    Coagulation Studies:  Recent Labs  04/07/15 1331  LABPROT 12.3  INR 0.89    Urinalysis:  Recent Labs Lab 04/07/15 1445  COLORURINE YELLOW*  LABSPEC 1.016  PHURINE 6.0  GLUCOSEU NEGATIVE  HGBUR NEGATIVE  BILIRUBINUR NEGATIVE  KETONESUR NEGATIVE  PROTEINUR NEGATIVE  NITRITE NEGATIVE  LEUKOCYTESUR NEGATIVE    Lipid Panel:     Component Value Date/Time   CHOL 164 04/08/2015 0234   TRIG 282* 04/08/2015 0234   HDL 34* 04/08/2015 0234   CHOLHDL 4.8 04/08/2015 0234   VLDL 56* 04/08/2015 0234   LDLCALC 74 04/08/2015 0234    HgbA1C: No results found for: HGBA1C  Urine Drug Screen:  No results found for: LABOPIA, COCAINSCRNUR, LABBENZ, AMPHETMU, THCU, LABBARB  Alcohol Level: No results for input(s): ETH in the last 168 hours.   Ct Angio Head W/cm &/or Wo Cm  04/07/2015  CLINICAL DATA:  Dizziness and blurred vision starting yesterday. EXAM: CT ANGIOGRAPHY HEAD AND NECK TECHNIQUE: Multidetector CT imaging of the head and neck was performed using the standard protocol during bolus administration of intravenous contrast. Multiplanar CT image reconstructions and MIPs were obtained to evaluate the vascular anatomy. Carotid  stenosis measurements (when applicable) are obtained utilizing NASCET criteria, using the distal internal carotid diameter as the denominator. CONTRAST:  126mL OMNIPAQUE IOHEXOL 350 MG/ML SOLN COMPARISON:  09/03/2013 FINDINGS: CT HEAD Skull and Sinuses:Negative for fracture or destructive process. The mastoids, middle ears, and imaged paranasal sinuses are clear. Orbits: No acute abnormality. Brain: No evidence of acute infarction, hemorrhage, hydrocephalus, or mass lesion/mass effect. CTA NECK Aortic arch: No aneurysm or dissection.  Three vessel branching. Right carotid system: Tiny mural calcification at the bifurcation compatible with early atherosclerosis. No stenosis, dissection, or evidence of vasculopathy. Left carotid system: Flat appearance of the posterior lumen proximal left ICA which is likely from early noncalcified atheromatous plaque. No stenosis, dissection, or evidence of vasculopathy. Vertebral arteries:Proximal subclavian arteries are widely patent. Mild left vertebral artery dominance. Patent vertebral arteries to the dura. No evidence of dissection. Skeleton: Negative Other neck: No incidentally detected mass or adenopathy in the neck. CTA HEAD Limited by venous contamination. Anterior circulation: Mild atheromatous calcification on the carotid siphon walls bilaterally. There is no evidence of major branch occlusion. No flow limiting stenosis. Conical outpouching from the right supraclinoid carotid artery which extends posteriorly and inferiorly, 3.6 mm in length. No emanating vessel is seen from  the tip. Posterior circulation: Mild left vertebral artery dominance. Symmetric vertebral and basilar branching. No occlusion or stenosis to explain symptoms. Negative for aneurysm. Venous sinuses: Patent Anatomic variants: None significant Delayed phase: No parenchymal enhancement. IMPRESSION: 1. No acute finding or explanation for dizziness. 2. Minimal atherosclerosis. 3. 3.6 mm outpouching from the  right supraclinoid carotid artery. Morphology favors infundibulum over aneurysm but at this size surveillance is still advised. Electronically Signed   By: Monte Fantasia M.D.   On: 04/07/2015 16:02   Ct Angio Neck W/cm &/or Wo/cm  04/07/2015  CLINICAL DATA:  Dizziness and blurred vision starting yesterday. EXAM: CT ANGIOGRAPHY HEAD AND NECK TECHNIQUE: Multidetector CT imaging of the head and neck was performed using the standard protocol during bolus administration of intravenous contrast. Multiplanar CT image reconstructions and MIPs were obtained to evaluate the vascular anatomy. Carotid stenosis measurements (when applicable) are obtained utilizing NASCET criteria, using the distal internal carotid diameter as the denominator. CONTRAST:  153mL OMNIPAQUE IOHEXOL 350 MG/ML SOLN COMPARISON:  09/03/2013 FINDINGS: CT HEAD Skull and Sinuses:Negative for fracture or destructive process. The mastoids, middle ears, and imaged paranasal sinuses are clear. Orbits: No acute abnormality. Brain: No evidence of acute infarction, hemorrhage, hydrocephalus, or mass lesion/mass effect. CTA NECK Aortic arch: No aneurysm or dissection.  Three vessel branching. Right carotid system: Tiny mural calcification at the bifurcation compatible with early atherosclerosis. No stenosis, dissection, or evidence of vasculopathy. Left carotid system: Flat appearance of the posterior lumen proximal left ICA which is likely from early noncalcified atheromatous plaque. No stenosis, dissection, or evidence of vasculopathy. Vertebral arteries:Proximal subclavian arteries are widely patent. Mild left vertebral artery dominance. Patent vertebral arteries to the dura. No evidence of dissection. Skeleton: Negative Other neck: No incidentally detected mass or adenopathy in the neck. CTA HEAD Limited by venous contamination. Anterior circulation: Mild atheromatous calcification on the carotid siphon walls bilaterally. There is no evidence of major  branch occlusion. No flow limiting stenosis. Conical outpouching from the right supraclinoid carotid artery which extends posteriorly and inferiorly, 3.6 mm in length. No emanating vessel is seen from the tip. Posterior circulation: Mild left vertebral artery dominance. Symmetric vertebral and basilar branching. No occlusion or stenosis to explain symptoms. Negative for aneurysm. Venous sinuses: Patent Anatomic variants: None significant Delayed phase: No parenchymal enhancement. IMPRESSION: 1. No acute finding or explanation for dizziness. 2. Minimal atherosclerosis. 3. 3.6 mm outpouching from the right supraclinoid carotid artery. Morphology favors infundibulum over aneurysm but at this size surveillance is still advised. Electronically Signed   By: Monte Fantasia M.D.   On: 04/07/2015 16:02   US Carotid Bilateral  04/08/2015  CLINICAL DATA:  Dizziness. History of TIA, hypertension, visual disturbance, syncopal episode, hyperlipidemia and smoking. EXAM: BILATERAL CAROTID DUPLEX ULTRASOUND TECHNIQUE: Pearline Cables scale imaging, color Doppler and duplex ultrasound were performed of bilateral carotid and vertebral arteries in the neck. COMPARISON:  Head CT - 04/07/2015; carotid Doppler ultrasound - 09/04/2013 FINDINGS: Criteria: Quantification of carotid stenosis is based on velocity parameters that correlate the residual internal carotid diameter with NASCET-based stenosis levels, using the diameter of the distal internal carotid lumen as the denominator for stenosis measurement. The following velocity measurements were obtained: RIGHT ICA:  65/30 cm/sec CCA:  76/16 cm/sec SYSTOLIC ICA/CCA RATIO:  0.8 DIASTOLIC ICA/CCA RATIO:  1.5 ECA:  84 cm/sec LEFT ICA:  78/22 cm/sec CCA:  07/37 cm/sec SYSTOLIC ICA/CCA RATIO:  1.0 DIASTOLIC ICA/CCA RATIO:  1.6 ECA:  54 cm/sec RIGHT CAROTID ARTERY: There is no grayscale  evidence of significant intimal thickening or atherosclerotic plaque affecting the interrogated portions of the  right carotid system. There are no elevated peak systolic velocities within the interrogated course of the right internal carotid artery to suggest a hemodynamically significant stenosis. RIGHT VERTEBRAL ARTERY:  Antegrade flow LEFT CAROTID ARTERY: There is no grayscale evidence of significant intimal thickening or atherosclerotic plaque affecting the interrogated portions of the left carotid system. There are no elevated peak systolic velocities within the interrogated course of the left internal carotid artery to suggest a hemodynamically significant stenosis. LEFT VERTEBRAL ARTERY:  Antegrade flow IMPRESSION: Normal carotid Doppler ultrasound. Electronically Signed   By: Sandi Mariscal M.D.   On: 04/08/2015 12:36     Assessment/Plan:   49 y.o. female with a known history of hypertension and migraines presents with 24 hours of dizziness and left-sided weakness. She reports that yesterday morning in the shower she had acute onset of dizziness with the room spinning nausea no vomiting. Dizziness is positional with positive [employer] pike  This is likely a peripheral problem rather then central.   Likely [facility] Appreciate PT evaluation performing Harland Dingwall, after which pt was very dizzy Epley's maneuver by PT tomorrow Meclizine PRN. No need for further imaging.   04/08/2015, 1:02 PM

## 2015-04-08 NOTE — Progress Notes (Signed)
[facility] NAME: Shakiah Wester    MR#:  [id number]  DATE OF BIRTH:  05/13/1966  SUBJECTIVE:  CHIEF COMPLAINT: Patient is reporting that room is spinning around her. Denies any tinnitus or ear pain. Denies any loss of consciousness. No similar complaints in the past. Still complaining of left-sided weakness. Denies any headache  REVIEW OF SYSTEMS:  CONSTITUTIONAL: No fever, fatigue or weakness.  EYES: No blurred or double vision.  EARS, NOSE, AND THROAT: No tinnitus or ear pain. Reporting room spinning around her RESPIRATORY: No cough, shortness of breath, wheezing or hemoptysis.  CARDIOVASCULAR: No chest pain, orthopnea, edema.  GASTROINTESTINAL: No nausea, vomiting, diarrhea or abdominal pain.  GENITOURINARY: No dysuria, hematuria.  ENDOCRINE: No polyuria, nocturia,  HEMATOLOGY: No anemia, easy bruising or bleeding SKIN: No rash or lesion. MUSCULOSKELETAL: No joint pain or arthritis.   NEUROLOGIC: No tingling, numbness, reporting left-sided weakness weakness.  PSYCHIATRY: No anxiety or depression.   DRUG ALLERGIES:  No Known Allergies  VITALS:  Blood pressure 102/57, pulse 62, temperature 97.8 F (36.6 C), temperature source Oral, resp. rate 18, height 5\' 1"  (1.549 m), weight 79.379 kg (175 lb), SpO2 98 %.  PHYSICAL EXAMINATION:  GENERAL:  49 y.o.-year-old patient lying in the bed with no acute distress.  EYES: Pupils equal, round, reactive to light and accommodation. No scleral icterus. Extraocular muscles intact.  HEENT: Head atraumatic, normocephalic. Oropharynx and nasopharynx clear.  NECK:  Supple, no jugular venous distention. No thyroid enlargement, no tenderness.  LUNGS: Normal breath sounds bilaterally, no wheezing, rales,rhonchi or crepitation. No use of accessory muscles of respiration.  CARDIOVASCULAR: S1, S2 normal. No murmurs, rubs, or gallops.  ABDOMEN: Soft, nontender, nondistended. Bowel sounds  present. No organomegaly or mass.  EXTREMITIES: No pedal edema, cyanosis, or clubbing.  NEUROLOGIC: Cranial nerves II through XII are intact. Muscle strength 5/5 in all extremities except in left upper and lower extremity with strength 3 -4out of 5. Sensation intact. Gait not checked.  PSYCHIATRIC: The patient is alert and oriented x 3.  SKIN: No obvious rash, lesion, or ulcer.    LABORATORY PANEL:   CBC  Recent Labs Lab 04/08/15 0234  WBC 5.7  HGB 11.2*  HCT 33.8*  PLT 268   ------------------------------------------------------------------------------------------------------------------  Chemistries   Recent Labs Lab 04/08/15 0234  NA 139  K 3.6  CL 104  CO2 28  GLUCOSE 111*  BUN 17  CREATININE 0.70  CALCIUM 9.0   ------------------------------------------------------------------------------------------------------------------  Cardiac Enzymes  Recent Labs Lab 04/08/15 0845  TROPONINI <0.03   ------------------------------------------------------------------------------------------------------------------  RADIOLOGY:  Ct Angio Head W/cm &/or Wo Cm  04/07/2015  CLINICAL DATA:  Dizziness and blurred vision starting yesterday. EXAM: CT ANGIOGRAPHY HEAD AND NECK TECHNIQUE: Multidetector CT imaging of the head and neck was performed using the standard protocol during bolus administration of intravenous contrast. Multiplanar CT image reconstructions and MIPs were obtained to evaluate the vascular anatomy. Carotid stenosis measurements (when applicable) are obtained utilizing NASCET criteria, using the distal internal carotid diameter as the denominator. CONTRAST:  198mL OMNIPAQUE IOHEXOL 350 MG/ML SOLN COMPARISON:  09/03/2013 FINDINGS: CT HEAD Skull and Sinuses:Negative for fracture or destructive process. The mastoids, middle ears, and imaged paranasal sinuses are clear. Orbits: No acute abnormality. Brain: No evidence of acute infarction, hemorrhage, hydrocephalus, or  mass lesion/mass effect. CTA NECK Aortic arch: No aneurysm or dissection.  Three vessel branching. Right carotid system: Tiny mural calcification at the bifurcation compatible with early atherosclerosis. No  stenosis, dissection, or evidence of vasculopathy. Left carotid system: Flat appearance of the posterior lumen proximal left ICA which is likely from early noncalcified atheromatous plaque. No stenosis, dissection, or evidence of vasculopathy. Vertebral arteries:Proximal subclavian arteries are widely patent. Mild left vertebral artery dominance. Patent vertebral arteries to the dura. No evidence of dissection. Skeleton: Negative Other neck: No incidentally detected mass or adenopathy in the neck. CTA HEAD Limited by venous contamination. Anterior circulation: Mild atheromatous calcification on the carotid siphon walls bilaterally. There is no evidence of major branch occlusion. No flow limiting stenosis. Conical outpouching from the right supraclinoid carotid artery which extends posteriorly and inferiorly, 3.6 mm in length. No emanating vessel is seen from the tip. Posterior circulation: Mild left vertebral artery dominance. Symmetric vertebral and basilar branching. No occlusion or stenosis to explain symptoms. Negative for aneurysm. Venous sinuses: Patent Anatomic variants: None significant Delayed phase: No parenchymal enhancement. IMPRESSION: 1. No acute finding or explanation for dizziness. 2. Minimal atherosclerosis. 3. 3.6 mm outpouching from the right supraclinoid carotid artery. Morphology favors infundibulum over aneurysm but at this size surveillance is still advised. Electronically Signed   By: Monte Fantasia M.D.   On: 04/07/2015 16:02   Ct Angio Neck W/cm &/or Wo/cm  04/07/2015  CLINICAL DATA:  Dizziness and blurred vision starting yesterday. EXAM: CT ANGIOGRAPHY HEAD AND NECK TECHNIQUE: Multidetector CT imaging of the head and neck was performed using the standard protocol during bolus  administration of intravenous contrast. Multiplanar CT image reconstructions and MIPs were obtained to evaluate the vascular anatomy. Carotid stenosis measurements (when applicable) are obtained utilizing NASCET criteria, using the distal internal carotid diameter as the denominator. CONTRAST:  176mL OMNIPAQUE IOHEXOL 350 MG/ML SOLN COMPARISON:  09/03/2013 FINDINGS: CT HEAD Skull and Sinuses:Negative for fracture or destructive process. The mastoids, middle ears, and imaged paranasal sinuses are clear. Orbits: No acute abnormality. Brain: No evidence of acute infarction, hemorrhage, hydrocephalus, or mass lesion/mass effect. CTA NECK Aortic arch: No aneurysm or dissection.  Three vessel branching. Right carotid system: Tiny mural calcification at the bifurcation compatible with early atherosclerosis. No stenosis, dissection, or evidence of vasculopathy. Left carotid system: Flat appearance of the posterior lumen proximal left ICA which is likely from early noncalcified atheromatous plaque. No stenosis, dissection, or evidence of vasculopathy. Vertebral arteries:Proximal subclavian arteries are widely patent. Mild left vertebral artery dominance. Patent vertebral arteries to the dura. No evidence of dissection. Skeleton: Negative Other neck: No incidentally detected mass or adenopathy in the neck. CTA HEAD Limited by venous contamination. Anterior circulation: Mild atheromatous calcification on the carotid siphon walls bilaterally. There is no evidence of major branch occlusion. No flow limiting stenosis. Conical outpouching from the right supraclinoid carotid artery which extends posteriorly and inferiorly, 3.6 mm in length. No emanating vessel is seen from the tip. Posterior circulation: Mild left vertebral artery dominance. Symmetric vertebral and basilar branching. No occlusion or stenosis to explain symptoms. Negative for aneurysm. Venous sinuses: Patent Anatomic variants: None significant Delayed phase: No  parenchymal enhancement. IMPRESSION: 1. No acute finding or explanation for dizziness. 2. Minimal atherosclerosis. 3. 3.6 mm outpouching from the right supraclinoid carotid artery. Morphology favors infundibulum over aneurysm but at this size surveillance is still advised. Electronically Signed   By: Monte Fantasia M.D.   On: 04/07/2015 16:02   US Carotid Bilateral  04/08/2015  CLINICAL DATA:  Dizziness. History of TIA, hypertension, visual disturbance, syncopal episode, hyperlipidemia and smoking. EXAM: BILATERAL CAROTID DUPLEX ULTRASOUND TECHNIQUE: Pearline Cables scale imaging, color Doppler and  duplex ultrasound were performed of bilateral carotid and vertebral arteries in the neck. COMPARISON:  Head CT - 04/07/2015; carotid Doppler ultrasound - 09/04/2013 FINDINGS: Criteria: Quantification of carotid stenosis is based on velocity parameters that correlate the residual internal carotid diameter with NASCET-based stenosis levels, using the diameter of the distal internal carotid lumen as the denominator for stenosis measurement. The following velocity measurements were obtained: RIGHT ICA:  65/30 cm/sec CCA:  41/96 cm/sec SYSTOLIC ICA/CCA RATIO:  0.8 DIASTOLIC ICA/CCA RATIO:  1.5 ECA:  84 cm/sec LEFT ICA:  78/22 cm/sec CCA:  22/29 cm/sec SYSTOLIC ICA/CCA RATIO:  1.0 DIASTOLIC ICA/CCA RATIO:  1.6 ECA:  54 cm/sec RIGHT CAROTID ARTERY: There is no grayscale evidence of significant intimal thickening or atherosclerotic plaque affecting the interrogated portions of the right carotid system. There are no elevated peak systolic velocities within the interrogated course of the right internal carotid artery to suggest a hemodynamically significant stenosis. RIGHT VERTEBRAL ARTERY:  Antegrade flow LEFT CAROTID ARTERY: There is no grayscale evidence of significant intimal thickening or atherosclerotic plaque affecting the interrogated portions of the left carotid system. There are no elevated peak systolic velocities within the  interrogated course of the left internal carotid artery to suggest a hemodynamically significant stenosis. LEFT VERTEBRAL ARTERY:  Antegrade flow IMPRESSION: Normal carotid Doppler ultrasound. Electronically Signed   By: Sandi Mariscal M.D.   On: 04/08/2015 12:36    EKG:   Orders placed or performed during the hospital encounter of 04/07/15  . EKG 12-Lead  . EKG 12-Lead  . EKG 12-Lead  . EKG 12-Lead    ASSESSMENT AND PLAN:   #1 dizziness with left-sided weakness on examination: Patient is reporting vertigo likely BPPV Will give her meclizine as needed basis Dix-hall-pike maneuver is positive,for Epleys in am CT is negative for acute CVA. Will get MRI MRA.  Right-sided carotid Dopplers with pouching which needs outpatient surveillance Monitor on telemetry.  Echocardiogram is pending Physical therapy is recommending SNF Follow up with neurology Outpatient ENT follow-up is recommended  #2 hypertension: Blood pressure is excellent on admission. Will hold home antihypertensives to allow for permissive hypertension in the setting of possible stroke.  #3. Confusion: Resolved .UA is negative for infection. No leukocytosis or fever.  #4 prophylaxis: Heparin for DVT prophylaxis. No GI prophylaxis needed at this time Disposition skilled nursing care, and follow-up with case management  All the records are reviewed and case discussed with Care Management/Social Workerr. Management plans discussed with the patient, family and they are in agreement.  CODE STATUS: Full code  TOTAL TIME TAKING CARE OF THIS PATIENT: 35 minutes.   POSSIBLE D/C IN 1-2DAYS, DEPENDING ON CLINICAL CONDITION.   Nicholes Mango M.D on 04/08/2015 at 2:13 PM  Between 7am to 6pm - Pager - [phone] After 6pm go to [email or web address] - password [email or web address] [facility]  [facility]  Office  [phone]  CC: Primary care physician; No PCP Per Patient

## 2015-04-08 NOTE — Plan of Care (Signed)
Problem: Discharge/Transitional Outcomes Goal: Other Discharge Outcomes/Goals Education:  Patient given stroke booklet.  Education on smoking cessation and risk factors for stroke. Hemodynamically:  Patient afebrile and VSS this shift.  Neuro checks Q2 with no changes in baseline.  BP 101/68 mmHg  Pulse 65  Temp(Src) 97.8 F (36.6 C) (Oral)  Resp 18  Ht 5\' 1"  (1.549 m)  Wt 175 lb (79.379 kg)  BMI 33.08 kg/m2  SpO2 98% Mobility:  Patient with weakness and instability to left side.  Left side drift.  Patient in bed this shift.   Diet:  Patient given and passed swallow screen.  Diet advanced from NPO to heart healthy.  Patient had [place] sandwich tray and sugar free jello.  Tolerated well. Pain:  Patient with complaints of headache of 10/10.  Given Norco per eMAR.  Patient would be asleep upon reassessment.

## 2015-04-08 NOTE — Progress Notes (Signed)
*  PRELIMINARY RESULTS* Echocardiogram 2D Echocardiogram has been performed.  Jocelyn Simon 04/08/2015, 11:47 AM

## 2015-04-08 NOTE — Evaluation (Signed)
Physical Therapy Evaluation Patient Details Name: Jocelyn Simon MRN: [id number] DOB: [birth year] Today's Date: 04/08/2015   History of Present Illness  Patient is a 49 y/o female that presents after beginning to experience dizziness in the shower and felt the room spinning yesterday. She has noted L sided weakness as well.   Clinical Impression  Patient presents with complaints of dizziness over the last 24-48 hours, and displays significantly positive R Dix-Hallpike. Patient denies symptoms with L Dix-Hallpike. VOR testing was positive as well. In standing patient has anterior-posterior swaying, so much so she requires RW to maintain her balance in standing. With ambulation with RW she has multiple loss of balance episodes laterally requiring use of her UEs and PT assistance to maintain her balance. PT attempted to treat underlying BPPV (it took several seconds to come on with R Marye Round), however patient became very tearful and was unable to tolerate this position today. Negative vertebral artery occlusion testing laterally. In summary it appears patient has positive testing for BPPV with R Marye Round, however there is some concern with her positive VOR testing for potential central involvement. She also complains of dizziness that lasts longer than anticipated 60 seconds and with sit to stand change (no orthostatics found in this session). PT recommends short term rehabilitation at this time as patient is unsafe to return home safely as a result of her imbalance. Skilled PT services are indicated to address her balance deficits and falls risk.     Follow Up Recommendations SNF (Pending resolution of her vertigo/balance symptoms)    Equipment Recommendations  Rolling walker with 5" wheels    Recommendations for Other Services       Precautions / Restrictions Precautions Precautions: Fall Restrictions Weight Bearing Restrictions: No      Mobility  Bed Mobility Overal bed  mobility: Modified Independent             General bed mobility comments: Patient uses bed rails to transfer supine to sit, becomes somewhat off balance in sitting but rights herself.   Transfers Overall transfer level: Needs assistance Equipment used: Rolling walker (2 wheeled) Transfers: Sit to/from Stand Sit to Stand: Supervision         General transfer comment: Patient is able to transfer to standing position independently, however she begins to have anterior-posterior swaying immediately and states she feels very dizzy. This does not seem to resolve with extended time in standing.   Ambulation/Gait Ambulation/Gait assistance: Min guard;Min assist Ambulation Distance (Feet): 90 Feet Assistive device: Rolling walker (2 wheeled) Gait Pattern/deviations: Drifts right/left;Staggering right   Gait velocity interpretation: Below normal speed for age/gender General Gait Details: During ambulation patient has multiple episodes of losing her balance laterally and continues to complain of increased dizziness, particularly when she stops or turns.   Stairs            Wheelchair Mobility    Modified Rankin (Stroke Patients Only)       Balance Overall balance assessment: Needs assistance   Sitting balance-Leahy Scale: Good Sitting balance - Comments: No pushing noted in sitting. She is off balance initially, however she is able to right herself.    Standing balance support: Bilateral upper extremity supported Standing balance-Leahy Scale: Poor Standing balance comment: Once in standing patient begins to experience anterior-posterior swaying and complains of significant dizziness, she loses her balance laterally in ambulation multiple occasions with RW.  Pertinent Vitals/Pain Pain Assessment: No/denies pain    Home Living Family/patient expects to be discharged to:: Private residence Living Arrangements: Spouse/significant  other Available Help at Discharge: Family (Husband works night shift)   Home Access: Level entry         Additional Comments: No assistive devices used at home.     Prior Function Level of Independence: Independent         Comments: Prior to this episode patient reports no falls and that she had been working.      Hand Dominance        Extremity/Trunk Assessment   Upper Extremity Assessment:  (Noted to have decreased grip strength LUE, L shoulder flexion, elbow flexion/extension - 4/5 roughly)           Lower Extremity Assessment:  (L Hip flexion unable to move through full range due to weakness? In available range 4-/4 /5, L quad 4+, L Hamstring 4/5, DF/PF grossly 4-/4)         Communication   Communication: No difficulties  Cognition Arousal/Alertness: Awake/alert Behavior During Therapy: WFL for tasks assessed/performed (Becomes tearful during Dix-Hallpike) Overall Cognitive Status: Within Functional Limits for tasks assessed                      General Comments      Exercises Other Exercises Other Exercises: Performed Dix-Hallpike on L, denied symptoms. On R patient became visibly agitated, unable to determine if nystagmus present however patient became "10/10" in dizziness and tearful. Attempted to treat on second trial, patient unable to tolerate position.  Other Exercises: VOR x 1 positive for increased dizziness.       Assessment/Plan    PT Assessment Patient needs continued PT services  PT Diagnosis Difficulty walking;Abnormality of gait   PT Problem List Decreased strength;Decreased mobility;Decreased safety awareness;Decreased balance;Decreased knowledge of use of DME  PT Treatment Interventions DME instruction;Therapeutic activities;Therapeutic exercise;Gait training;Balance training;Neuromuscular re-education   PT Goals (Current goals can be found in the Care Plan section) Acute Rehab PT Goals Patient Stated Goal: To return home  safely  PT Goal Formulation: With patient Time For Goal Achievement: 04/22/15 Potential to Achieve Goals: Good    Frequency 7X/week   Barriers to discharge        Co-evaluation               End of Session Equipment Utilized During Treatment: Gait belt Activity Tolerance: Treatment limited secondary to medical complications (Comment) Patient left: in bed;with bed alarm set;with family/visitor present;with call bell/phone within reach Nurse Communication: Mobility status    Functional Limitation: Mobility: Walking and moving around Mobility: Walking and Moving Around Current Status (G6269): At least 20 percent but less than 40 percent impaired, limited or restricted Mobility: Walking and Moving Around Goal Status [phone]): At least 1 percent but less than 20 percent impaired, limited or restricted    Time: 1033-1108 PT Time Calculation (min) (ACUTE ONLY): 35 min   Charges:   PT Evaluation $Initial PT Evaluation Tier I: 1 Procedure     PT G Codes:   PT G-Codes **NOT FOR INPATIENT CLASS** Functional Limitation: Mobility: Walking and moving around Mobility: Walking and Moving Around Current Status (E7035): At least 20 percent but less than 40 percent impaired, limited or restricted Mobility: Walking and Moving Around Goal Status [phone]): At least 1 percent but less than 20 percent impaired, limited or restricted    Kerman Passey, PT, DPT    04/08/2015, 1:42 PM

## 2015-04-09 DIAGNOSIS — M6289 Other specified disorders of muscle: Secondary | ICD-10-CM

## 2015-04-09 LAB — URINE CULTURE

## 2015-04-09 LAB — BASIC METABOLIC PANEL
Anion gap: 7 (ref 5–15)
BUN: 16 mg/dL (ref 6–20)
CHLORIDE: 102 mmol/L (ref 101–111)
CO2: 29 mmol/L (ref 22–32)
Calcium: 9.4 mg/dL (ref 8.9–10.3)
Creatinine, Ser: 0.68 mg/dL (ref 0.44–1.00)
GFR calc Af Amer: >60 mL/min (ref >60)
GLUCOSE: 109 mg/dL — AB (ref 65–99)
POTASSIUM: 3.8 mmol/L (ref 3.5–5.1)
Sodium: 138 mmol/L (ref 135–145)

## 2015-04-09 LAB — HEMOGLOBIN A1C: Hgb A1c MFr Bld: 6.1 % — ABNORMAL HIGH (ref 4.0–6.0)

## 2015-04-09 LAB — CBC
HCT: 34.8 % — ABNORMAL LOW (ref 35.0–47.0)
Hemoglobin: 11.5 g/dL — ABNORMAL LOW (ref 12.0–16.0)
MCH: 28.7 pg (ref 26.0–34.0)
MCHC: 33 g/dL (ref 32.0–36.0)
MCV: 87 fL (ref 80.0–100.0)
PLATELETS: 260 10*3/uL (ref 150–440)
RBC: 4 MIL/uL (ref 3.80–5.20)
RDW: 13.3 % (ref 11.5–14.5)
WBC: 4.9 10*3/uL (ref 3.6–11.0)

## 2015-04-09 MED ORDER — MECLIZINE HCL 25 MG PO TABS
25.0000 mg | ORAL_TABLET | Freq: Three times a day (TID) | ORAL | Status: DC
  Administered 2015-04-09 – 2015-04-10 (×3): 25 mg via ORAL
  Filled 2015-04-09 (×3): qty 1

## 2015-04-09 NOTE — Plan of Care (Signed)
Problem: Discharge/Transitional Outcomes Goal: Other Discharge Outcomes/Goals Outcome: Progressing 1. Plans to return home with family support. 2. Educating on vertigo, management, safety with benefit from ENT referral/PT consult. 3. VSS. Labs stable. MRI negative. 4. Less unsteady,less dizzy when up; Ambulates with walker to BR/in the hall with walker with SB+. Meclizine changed to 3 times a day with pt reporting no difference. Tylenol prn left sided HA effective. PT working with pt with vertigo techiniques/assessment currently. 5. Eating well; good appetite. 6. Husband and other family member in with agreement with pt being discharged home soon with ENT outpatient referral with family support. 7. Will plan PCP appt at discharge. 8. Reports can obtain meds as needed.

## 2015-04-09 NOTE — Plan of Care (Signed)
Problem: Discharge/Transitional Outcomes Goal: Other Discharge Outcomes/Goals Education: Patient given stroke booklet at admission.  Reinforcement of risk factors for stroke and modifiable behavior given. Hemodynamically: Patient afebrile and VSS this shift. Neuro checks Q4 with no changes in baseline. Patient continues with vertigo and headaches.  BP 117/69 mmHg  Pulse 83  Temp(Src) 98.5 F (36.9 C) (Oral)  Resp 18  Ht 5\' 1"  (1.549 m)  Wt 175 lb (79.379 kg)  BMI 33.08 kg/m2  SpO2 96% Mobility: Patient with weakness and instability to left side from previous stroke. Left side drift. Patient up to bathroom with walker and one person assist.  Balance continues to be unsteady from previous stroke.  Tolerated well.  Diet: Patient with food brought to her from husband.  Had hamburger and fries.  Tolerated well. Pain: Patient with complaints of headache of 10/10. Given Norco per eMAR. Patient was asleep upon reassessment.

## 2015-04-09 NOTE — Consult Note (Signed)
CC: dizziness   HPI: Jocelyn Simon is an 49 y.o. female with a known history of hypertension and migraines presents with 24 hours of dizziness and left-sided weakness. She reports that yesterday morning in the shower she had acute onset of dizziness with the room spinning nausea no vomiting. Dizziness is positional with positive [employer].     States symptoms have improved today.   Past Medical History  Diagnosis Date  . Hypertension   . Stroke ([place])   . Headache     History reviewed. No pertinent past surgical history.  Family History  Problem Relation Age of Onset  . CAD Mother   . CAD Sister   . CVA Other     Social History:  reports that she has been smoking Cigarettes.  She has been smoking about 0.25 packs per day. She does not have any smokeless tobacco history on file. She reports that she does not drink alcohol or use illicit drugs.  No Known Allergies  Medications: I have reviewed the patient's current medications.  ROS: History obtained from the patient  General ROS: negative for - chills, fatigue, fever, night sweats, weight gain or weight loss Psychological ROS: negative for - behavioral disorder, hallucinations, memory difficulties, mood swings or suicidal ideation Ophthalmic ROS: negative for - blurry vision, double vision, eye pain or loss of vision ENT ROS: negative for - epistaxis, nasal discharge, oral lesions, sore throat, tinnitus or vertigo Allergy and Immunology ROS: negative for - hives or itchy/watery eyes Hematological and Lymphatic ROS: negative for - bleeding problems, bruising or swollen lymph nodes Endocrine ROS: negative for - galactorrhea, hair pattern changes, polydipsia/polyuria or temperature intolerance Respiratory ROS: negative for - cough, hemoptysis, shortness of breath or wheezing Cardiovascular ROS: negative for - chest pain, dyspnea on exertion, edema or irregular heartbeat Gastrointestinal ROS: negative for - abdominal pain,  diarrhea, hematemesis, nausea/vomiting or stool incontinence Genito-Urinary ROS: negative for - dysuria, hematuria, incontinence or urinary frequency/urgency Musculoskeletal ROS: negative for - joint swelling or muscular weakness Neurological ROS: as noted in HPI Dermatological ROS: negative for rash and skin lesion changes  Physical Examination: Blood pressure 114/70, pulse 70, temperature 97.9 F (36.6 C), temperature source Oral, resp. rate 18, height 5\' 1"  (1.549 m), weight 79.379 kg (175 lb), SpO2 100 %.   Neurological Examination Mental Status: Alert, oriented, thought content appropriate.  Speech fluent without evidence of aphasia.  Cranial Nerves: II: Discs flat bilaterally; Visual fields grossly normal, pupils equal, round, reactive to light and accommodation III,IV, VI: ptosis not present, extra-ocular motions intact bilaterally V,VII: smile symmetric, facial light touch sensation normal bilaterally VIII: hearing normal bilaterally IX,X: gag reflex present XI: bilateral shoulder shrug XII: midline tongue extension Motor: Right : Upper extremity   4+/5    Left:     Upper extremity   4+/5  Lower extremity   4+/5     Lower extremity   4+/5 Tone and bulk:normal tone throughout; no atrophy noted Sensory: Pinprick and light touch intact throughout, bilaterally Deep Tendon Reflexes: 1+ and symmetric throughout Plantars: Right: downgoing   Left: downgoing Cerebellar: normal finger-to-nose, normal rapid alternating movements and normal heel-to-shin test Gait: not tested      Laboratory Studies:   Basic Metabolic Panel:  Recent Labs Lab 04/07/15 1331 04/08/15 0234 04/09/15 0417  NA 141 139 138  K 3.6 3.6 3.8  CL 103 104 102  CO2 29 28 29   GLUCOSE 110* 111* 109*  BUN 18 17 16   CREATININE  0.68 0.70 0.68  CALCIUM 9.8 9.0 9.4    Liver Function Tests: No results for input(s): AST, ALT, ALKPHOS, BILITOT, PROT, ALBUMIN in the last 168 hours. No results for input(s):  LIPASE, AMYLASE in the last 168 hours. No results for input(s): AMMONIA in the last 168 hours.  CBC:  Recent Labs Lab 04/07/15 1331 04/08/15 0234 04/09/15 0417  WBC 5.1 5.7 4.9  NEUTROABS 2.3  --   --   HGB 12.0 11.2* 11.5*  HCT 37.4 33.8* 34.8*  MCV 86.0 86.2 87.0  PLT 296 268 260    Cardiac Enzymes:  Recent Labs Lab 04/07/15 1331 04/07/15 2124 04/08/15 0234 04/08/15 0845  TROPONINI <0.03 <0.03 <0.03 <0.03    BNP: Invalid input(s): POCBNP  CBG:  Recent Labs Lab 04/07/15 1331 04/08/15 1638  GLUCAP 137* 111*    Microbiology: Results for orders placed or performed during the hospital encounter of 04/07/15  Urine culture     Status: None   Collection Time: 04/07/15  2:45 PM  Result Value Ref Range Status   Specimen Description URINE, RANDOM  Final   Special Requests NONE  Final   Culture INSIGNIFICANT GROWTH  Final   Report Status 04/09/2015 FINAL  Final    Coagulation Studies:  Recent Labs  04/07/15 1331  LABPROT 12.3  INR 0.89    Urinalysis:   Recent Labs Lab 04/07/15 1445  COLORURINE YELLOW*  LABSPEC 1.016  PHURINE 6.0  GLUCOSEU NEGATIVE  HGBUR NEGATIVE  BILIRUBINUR NEGATIVE  KETONESUR NEGATIVE  PROTEINUR NEGATIVE  NITRITE NEGATIVE  LEUKOCYTESUR NEGATIVE    Lipid Panel:     Component Value Date/Time   CHOL 164 04/08/2015 0234   TRIG 282* 04/08/2015 0234   HDL 34* 04/08/2015 0234   CHOLHDL 4.8 04/08/2015 0234   VLDL 56* 04/08/2015 0234   LDLCALC 74 04/08/2015 0234    HgbA1C:  Lab Results  Component Value Date   HGBA1C 5.9 04/08/2015    Urine Drug Screen:  No results found for: LABOPIA, COCAINSCRNUR, LABBENZ, AMPHETMU, THCU, LABBARB  Alcohol Level: No results for input(s): ETH in the last 168 hours.   Ct Angio Head W/cm &/or Wo Cm  04/07/2015  CLINICAL DATA:  Dizziness and blurred vision starting yesterday. EXAM: CT ANGIOGRAPHY HEAD AND NECK TECHNIQUE: Multidetector CT imaging of the head and neck was performed using  the standard protocol during bolus administration of intravenous contrast. Multiplanar CT image reconstructions and MIPs were obtained to evaluate the vascular anatomy. Carotid stenosis measurements (when applicable) are obtained utilizing NASCET criteria, using the distal internal carotid diameter as the denominator. CONTRAST:  137mL OMNIPAQUE IOHEXOL 350 MG/ML SOLN COMPARISON:  09/03/2013 FINDINGS: CT HEAD Skull and Sinuses:Negative for fracture or destructive process. The mastoids, middle ears, and imaged paranasal sinuses are clear. Orbits: No acute abnormality. Brain: No evidence of acute infarction, hemorrhage, hydrocephalus, or mass lesion/mass effect. CTA NECK Aortic arch: No aneurysm or dissection.  Three vessel branching. Right carotid system: Tiny mural calcification at the bifurcation compatible with early atherosclerosis. No stenosis, dissection, or evidence of vasculopathy. Left carotid system: Flat appearance of the posterior lumen proximal left ICA which is likely from early noncalcified atheromatous plaque. No stenosis, dissection, or evidence of vasculopathy. Vertebral arteries:Proximal subclavian arteries are widely patent. Mild left vertebral artery dominance. Patent vertebral arteries to the dura. No evidence of dissection. Skeleton: Negative Other neck: No incidentally detected mass or adenopathy in the neck. CTA HEAD Limited by venous contamination. Anterior circulation: Mild atheromatous calcification on the carotid siphon  walls bilaterally. There is no evidence of major branch occlusion. No flow limiting stenosis. Conical outpouching from the right supraclinoid carotid artery which extends posteriorly and inferiorly, 3.6 mm in length. No emanating vessel is seen from the tip. Posterior circulation: Mild left vertebral artery dominance. Symmetric vertebral and basilar branching. No occlusion or stenosis to explain symptoms. Negative for aneurysm. Venous sinuses: Patent Anatomic variants: None  significant Delayed phase: No parenchymal enhancement. IMPRESSION: 1. No acute finding or explanation for dizziness. 2. Minimal atherosclerosis. 3. 3.6 mm outpouching from the right supraclinoid carotid artery. Morphology favors infundibulum over aneurysm but at this size surveillance is still advised. Electronically Signed   By: Monte Fantasia M.D.   On: 04/07/2015 16:02   Ct Angio Neck W/cm &/or Wo/cm  04/07/2015  CLINICAL DATA:  Dizziness and blurred vision starting yesterday. EXAM: CT ANGIOGRAPHY HEAD AND NECK TECHNIQUE: Multidetector CT imaging of the head and neck was performed using the standard protocol during bolus administration of intravenous contrast. Multiplanar CT image reconstructions and MIPs were obtained to evaluate the vascular anatomy. Carotid stenosis measurements (when applicable) are obtained utilizing NASCET criteria, using the distal internal carotid diameter as the denominator. CONTRAST:  178mL OMNIPAQUE IOHEXOL 350 MG/ML SOLN COMPARISON:  09/03/2013 FINDINGS: CT HEAD Skull and Sinuses:Negative for fracture or destructive process. The mastoids, middle ears, and imaged paranasal sinuses are clear. Orbits: No acute abnormality. Brain: No evidence of acute infarction, hemorrhage, hydrocephalus, or mass lesion/mass effect. CTA NECK Aortic arch: No aneurysm or dissection.  Three vessel branching. Right carotid system: Tiny mural calcification at the bifurcation compatible with early atherosclerosis. No stenosis, dissection, or evidence of vasculopathy. Left carotid system: Flat appearance of the posterior lumen proximal left ICA which is likely from early noncalcified atheromatous plaque. No stenosis, dissection, or evidence of vasculopathy. Vertebral arteries:Proximal subclavian arteries are widely patent. Mild left vertebral artery dominance. Patent vertebral arteries to the dura. No evidence of dissection. Skeleton: Negative Other neck: No incidentally detected mass or adenopathy in the  neck. CTA HEAD Limited by venous contamination. Anterior circulation: Mild atheromatous calcification on the carotid siphon walls bilaterally. There is no evidence of major branch occlusion. No flow limiting stenosis. Conical outpouching from the right supraclinoid carotid artery which extends posteriorly and inferiorly, 3.6 mm in length. No emanating vessel is seen from the tip. Posterior circulation: Mild left vertebral artery dominance. Symmetric vertebral and basilar branching. No occlusion or stenosis to explain symptoms. Negative for aneurysm. Venous sinuses: Patent Anatomic variants: None significant Delayed phase: No parenchymal enhancement. IMPRESSION: 1. No acute finding or explanation for dizziness. 2. Minimal atherosclerosis. 3. 3.6 mm outpouching from the right supraclinoid carotid artery. Morphology favors infundibulum over aneurysm but at this size surveillance is still advised. Electronically Signed   By: Monte Fantasia M.D.   On: 04/07/2015 16:02   Mr Brain Wo Contrast  04/08/2015  CLINICAL DATA:  Left-sided weakness.  Dizziness. EXAM: MRI HEAD WITHOUT CONTRAST TECHNIQUE: Multiplanar, multiecho pulse sequences of the brain and surrounding structures were obtained without intravenous contrast. COMPARISON:  CT head 04/07/2015 FINDINGS: Ventricle size is normal. Cerebral volume is normal. No atrophy identified. Pituitary normal in size. Craniocervical junction normal. Negative for acute or chronic infarction Negative for demyelinating disease. Cerebral white matter normal. Basal ganglia and brainstem normal Negative for hemorrhage or mass. No edema or shift of the midline structures. Mucous retention cyst left maxillary sinus. Mild mucosal edema in the paranasal sinuses and mastoid sinus on the left. IMPRESSION: Normal MRI of the brain  without contrast Mild chronic sinusitis. Electronically Signed   By: Franchot Gallo M.D.   On: 04/08/2015 19:21   US Carotid Bilateral  04/08/2015  CLINICAL  DATA:  Dizziness. History of TIA, hypertension, visual disturbance, syncopal episode, hyperlipidemia and smoking. EXAM: BILATERAL CAROTID DUPLEX ULTRASOUND TECHNIQUE: Pearline Cables scale imaging, color Doppler and duplex ultrasound were performed of bilateral carotid and vertebral arteries in the neck. COMPARISON:  Head CT - 04/07/2015; carotid Doppler ultrasound - 09/04/2013 FINDINGS: Criteria: Quantification of carotid stenosis is based on velocity parameters that correlate the residual internal carotid diameter with NASCET-based stenosis levels, using the diameter of the distal internal carotid lumen as the denominator for stenosis measurement. The following velocity measurements were obtained: RIGHT ICA:  65/30 cm/sec CCA:  15/94 cm/sec SYSTOLIC ICA/CCA RATIO:  0.8 DIASTOLIC ICA/CCA RATIO:  1.5 ECA:  84 cm/sec LEFT ICA:  78/22 cm/sec CCA:  58/59 cm/sec SYSTOLIC ICA/CCA RATIO:  1.0 DIASTOLIC ICA/CCA RATIO:  1.6 ECA:  54 cm/sec RIGHT CAROTID ARTERY: There is no grayscale evidence of significant intimal thickening or atherosclerotic plaque affecting the interrogated portions of the right carotid system. There are no elevated peak systolic velocities within the interrogated course of the right internal carotid artery to suggest a hemodynamically significant stenosis. RIGHT VERTEBRAL ARTERY:  Antegrade flow LEFT CAROTID ARTERY: There is no grayscale evidence of significant intimal thickening or atherosclerotic plaque affecting the interrogated portions of the left carotid system. There are no elevated peak systolic velocities within the interrogated course of the left internal carotid artery to suggest a hemodynamically significant stenosis. LEFT VERTEBRAL ARTERY:  Antegrade flow IMPRESSION: Normal carotid Doppler ultrasound. Electronically Signed   By: Sandi Mariscal M.D.   On: 04/08/2015 12:36     Assessment/Plan:   49 y.o. female with a known history of hypertension and migraines presents with 24 hours of dizziness and  left-sided weakness. She reports that yesterday morning in the shower she had acute onset of dizziness with the room spinning nausea no vomiting. Dizziness is positional with positive [employer] pike  Symptoms have improved. States positional dizziness is better today.  MRI no acute abnormality. No hemodynamic stenosis on [place] of carotids. This is likely a peripheral problem rather then central.   Likely BPPV Epley's maneuver by PT today if possible. Will need out patient PT Meclizine PRN. D/c planning once symptoms improve.  Leotis Pain   04/09/2015, 10:14 AM

## 2015-04-09 NOTE — Progress Notes (Signed)
Physical Therapy Treatment Patient Details Name: Jocelyn Simon MRN: [id number] DOB: 1965-09-28 Today's Date: 04/09/2015    History of Present Illness Patient is a 49 y/o female that presents after beginning to experience dizziness in the shower and felt the room spinning yesterday. She has noted L sided weakness as well.     PT Comments    Pt did not seem to follow typical BPPV symptoms with Epley progression.  She only had vertigo (rather severe and lasting >2 minutes) and nystagmus with the initial assaulting position but no nystagmus or real symptoms during the rest of the positions.  She shows good willingness to participate with treatment but continues to have issues.  She is able to ambulate w/o AD today, but does continues to have unsteadiness and is unsure of herself.  Pt will likely need further ENT and PT follow-up.    Follow Up Recommendations  Home health PT (Pt will likely need to see an ENT and then vestibular PT)     Equipment Recommendations       Recommendations for Other Services       Precautions / Restrictions Precautions Precautions: Fall Restrictions Weight Bearing Restrictions: No    Mobility  Bed Mobility Overal bed mobility: Independent             General bed mobility comments: Pt has some dizziness getting back up to sitting after both Eply Maneuvers and needs some minimal guard to remains upright  Transfers Overall transfer level: Modified independent Equipment used: None Transfers: Sit to/from Stand Sit to Stand: Supervision         General transfer comment: Pt gets to standing w/o issue, she does have a stagger steps and wobble on standing, but is able to self correct and maintain balance well.  Ambulation/Gait Ambulation/Gait assistance: Min guard Ambulation Distance (Feet): 200 Feet Assistive device: None       General Gait Details: Pt has some veering and limp during ambualtion but is able to maintain consistent but  somewhat unsteady gait not needing HHA.  She does not feel overly confident, but does show great effort and determination.     Stairs            Wheelchair Mobility    Modified Rankin (Stroke Patients Only)       Balance                                    Cognition Arousal/Alertness: Awake/alert Behavior During Therapy: WFL for tasks assessed/performed Overall Cognitive Status: Within Functional Limits for tasks assessed                      Exercises Other Exercises Other Exercises: Eply maneuver X 2 for the R side.  She has prolonged and intense symptoms with low grade nystagmus in initial R Dix-Hallpike positions, but with turn to L and looking down she does not have any vertigo or dizziness.     General Comments        Pertinent Vitals/Pain Pain Assessment: No/denies pain    Home Living                      Prior Function            PT Goals (current goals can now be found in the care plan section) Progress towards PT goals: Progressing toward goals    Frequency  7X/week (though she did not officially have a CVA)    PT Plan Current plan remains appropriate    Co-evaluation             End of Session Equipment Utilized During Treatment: Gait belt Activity Tolerance: Patient tolerated treatment well (pt does have severe dizzienss with canalith repositioning) Patient left: in bed;with bed alarm set;with family/visitor present;with call bell/phone within reach     Time: 1342-1408 PT Time Calculation (min) (ACUTE ONLY): 26 min  Charges:  $Gait Training: 8-22 mins $Canalith Rep Proc: 8-22 mins                    G Codes:     Wayne Both, PT, DPT [phone]  Kreg Shropshire 04/09/2015, 4:52 PM

## 2015-04-09 NOTE — Progress Notes (Signed)
[facility] [place] NAME: Jocelyn Simon    MR#:  [id number]  DATE OF BIRTH:  [birth year]  SUBJECTIVE:  CHIEF COMPLAINT: Patient is reporting vertigo still. . Denies any tinnitus or ear pain. Improved left-sided weakness. Denies any headache  REVIEW OF SYSTEMS:  CONSTITUTIONAL: No fever, fatigue or weakness.  EYES: No blurred or double vision.  EARS, NOSE, AND THROAT: No tinnitus or ear pain. Reporting room spinning around her RESPIRATORY: No cough, shortness of breath, wheezing or hemoptysis.  CARDIOVASCULAR: No chest pain, orthopnea, edema.  GASTROINTESTINAL: No nausea, vomiting, diarrhea or abdominal pain.  GENITOURINARY: No dysuria, hematuria.  ENDOCRINE: No polyuria, nocturia,  HEMATOLOGY: No anemia, easy bruising or bleeding SKIN: No rash or lesion. MUSCULOSKELETAL: No joint pain or arthritis.   NEUROLOGIC: No tingling, numbness, reporting vertigo PSYCHIATRY: No anxiety or depression.   DRUG ALLERGIES:  No Known Allergies  VITALS:  Blood pressure 148/79, pulse 81, temperature 98.7 F (37.1 C), temperature source Oral, resp. rate 20, height 5\' 1"  (1.549 m), weight 79.379 kg (175 lb), SpO2 99 %.  PHYSICAL EXAMINATION:  GENERAL:  49 y.o.-year-old patient lying in the bed with no acute distress.  EYES: Pupils equal, round, reactive to light and accommodation. No scleral icterus. Extraocular muscles intact.  HEENT: Head atraumatic, normocephalic. Oropharynx and nasopharynx clear.  NECK:  Supple, no jugular venous distention. No thyroid enlargement, no tenderness.  LUNGS: Normal breath sounds bilaterally, no wheezing, rales,rhonchi or crepitation. No use of accessory muscles of respiration.  CARDIOVASCULAR: S1, S2 normal. No murmurs, rubs, or gallops.  ABDOMEN: Soft, nontender, nondistended. Bowel sounds present. No organomegaly or mass.  EXTREMITIES: No pedal edema, cyanosis, or clubbing.  NEUROLOGIC: Cranial nerves II  through XII are intact. Muscle strength 5/5 in all extremities  Sensation intact. Gait not checked.  PSYCHIATRIC: The patient is alert and oriented x 3.  SKIN: No obvious rash, lesion, or ulcer.    LABORATORY PANEL:   CBC  Recent Labs Lab 04/09/15 0417  WBC 4.9  HGB 11.5*  HCT 34.8*  PLT 260   ------------------------------------------------------------------------------------------------------------------  Chemistries   Recent Labs Lab 04/09/15 0417  NA 138  K 3.8  CL 102  CO2 29  GLUCOSE 109*  BUN 16  CREATININE 0.68  CALCIUM 9.4   ------------------------------------------------------------------------------------------------------------------  Cardiac Enzymes  Recent Labs Lab 04/08/15 0845  TROPONINI <0.03   ------------------------------------------------------------------------------------------------------------------  RADIOLOGY:  Mr Brain Wo Contrast  04/08/2015  CLINICAL DATA:  Left-sided weakness.  Dizziness. EXAM: MRI HEAD WITHOUT CONTRAST TECHNIQUE: Multiplanar, multiecho pulse sequences of the brain and surrounding structures were obtained without intravenous contrast. COMPARISON:  CT head 04/07/2015 FINDINGS: Ventricle size is normal. Cerebral volume is normal. No atrophy identified. Pituitary normal in size. Craniocervical junction normal. Negative for acute or chronic infarction Negative for demyelinating disease. Cerebral white matter normal. Basal ganglia and brainstem normal Negative for hemorrhage or mass. No edema or shift of the midline structures. Mucous retention cyst left maxillary sinus. Mild mucosal edema in the paranasal sinuses and mastoid sinus on the left. IMPRESSION: Normal MRI of the brain without contrast Mild chronic sinusitis. Electronically Signed   By: Franchot Gallo M.D.   On: 04/08/2015 19:21   US Carotid Bilateral  04/08/2015  CLINICAL DATA:  Dizziness. History of TIA, hypertension, visual disturbance, syncopal episode,  hyperlipidemia and smoking. EXAM: BILATERAL CAROTID DUPLEX ULTRASOUND TECHNIQUE: Pearline Cables scale imaging, color Doppler and duplex ultrasound were performed of bilateral carotid and vertebral arteries in the neck.  COMPARISON:  Head CT - 04/07/2015; carotid Doppler ultrasound - 09/04/2013 FINDINGS: Criteria: Quantification of carotid stenosis is based on velocity parameters that correlate the residual internal carotid diameter with NASCET-based stenosis levels, using the diameter of the distal internal carotid lumen as the denominator for stenosis measurement. The following velocity measurements were obtained: RIGHT ICA:  65/30 cm/sec CCA:  79/43 cm/sec SYSTOLIC ICA/CCA RATIO:  0.8 DIASTOLIC ICA/CCA RATIO:  1.5 ECA:  84 cm/sec LEFT ICA:  78/22 cm/sec CCA:  27/61 cm/sec SYSTOLIC ICA/CCA RATIO:  1.0 DIASTOLIC ICA/CCA RATIO:  1.6 ECA:  54 cm/sec RIGHT CAROTID ARTERY: There is no grayscale evidence of significant intimal thickening or atherosclerotic plaque affecting the interrogated portions of the right carotid system. There are no elevated peak systolic velocities within the interrogated course of the right internal carotid artery to suggest a hemodynamically significant stenosis. RIGHT VERTEBRAL ARTERY:  Antegrade flow LEFT CAROTID ARTERY: There is no grayscale evidence of significant intimal thickening or atherosclerotic plaque affecting the interrogated portions of the left carotid system. There are no elevated peak systolic velocities within the interrogated course of the left internal carotid artery to suggest a hemodynamically significant stenosis. LEFT VERTEBRAL ARTERY:  Antegrade flow IMPRESSION: Normal carotid Doppler ultrasound. Electronically Signed   By: Sandi Mariscal M.D.   On: 04/08/2015 12:36    EKG:   Orders placed or performed during the hospital encounter of 04/07/15  . EKG 12-Lead  . EKG 12-Lead  . EKG 12-Lead  . EKG 12-Lead    ASSESSMENT AND PLAN:   #1 dizziness : Patient is reporting  vertigo likely BPPV  meclizine given with no imrovement Dix-hall-pike maneuver is positive, Epleys neg per PT, PT recommends home PT AND vestinular PT CT is negative for acute CVA.  MRI neg Right-sided carotid Dopplers with pouching which needs outpatient surveillance Monitor on telemetry.  Echocardiogram is pending Physical therapy is recommending SNF Follow up with neurology Outpatient ENT follow-up is recommended  #2 hypertension: Blood pressure is excellent on admission. Will hold home antihypertensives to allow for permissive hypertension in the setting of possible stroke.  #3. Confusion: Resolved .UA is negative for infection. No leukocytosis or fever.  #4 prophylaxis: Heparin for DVT prophylaxis. No GI prophylaxis needed at this time Disposition skilled nursing care, and follow-up with case management  All the records are reviewed and case discussed with Care Management/Social Workerr. Management plans discussed with the patient, family and they are in agreement.  CODE STATUS: Full code  TOTAL TIME TAKING CARE OF THIS PATIENT: 35 minutes.   POSSIBLE D/C IN am DAYS, DEPENDING ON CLINICAL CONDITION.   Nicholes Mango M.D on 04/09/2015 at 10:40 PM  Between 7am to 6pm - Pager - [phone] After 6pm go to [email or web address] - password [email or web address] [facility]  [facility]  Office  [phone]  CC: Primary care physician; No PCP Per Patient

## 2015-04-10 MED ORDER — HYDROCODONE-ACETAMINOPHEN 5-325 MG PO TABS: 1.0000 | ORAL_TABLET | Freq: Four times a day (QID) | ORAL | Status: DC | PRN

## 2015-04-10 MED ORDER — NICOTINE 7 MG/24HR TD PT24: 7.0000 mg | MEDICATED_PATCH | Freq: Every day | TRANSDERMAL | Status: DC

## 2015-04-10 MED ORDER — CYCLOBENZAPRINE HCL 5 MG PO TABS: 5.0000 mg | ORAL_TABLET | Freq: Three times a day (TID) | ORAL | Status: DC | PRN

## 2015-04-10 MED ORDER — MECLIZINE HCL 25 MG PO TABS: 25.0000 mg | ORAL_TABLET | Freq: Three times a day (TID) | ORAL | Status: DC

## 2015-04-10 MED ORDER — ACETAMINOPHEN 325 MG PO TABS: 650.0000 mg | ORAL_TABLET | Freq: Four times a day (QID) | ORAL | Status: DC | PRN

## 2015-04-10 NOTE — Care Management (Signed)
Admitted to [facility] with the diagnosis of left sided weakness. Lives with husband. Sister is Laurey Arrow [phone]). No home Health. Takes care of all basic activities of daily living herself. A resident of [place]. Went to the [facility] 1 week prior to be admitted to this facility.  Information on [facility] given to Ms Wingard. Also, information on System wide Financial Counseling Phone list. Discharge to home today per Dr. Margaretmary Eddy. Shelbie Ammons RN MSN Care Management [phone] .

## 2015-04-10 NOTE — Discharge Summary (Signed)
[facility] [place] NAME: Jocelyn Simon    MR#:  [id number]  DATE OF BIRTH:  [birth year]  DATE OF ADMISSION:  04/07/2015 ADMITTING PHYSICIAN: Aldean Jewett, MD  DATE OF DISCHARGE: 04/10/2015 PRIMARY CARE PHYSICIAN: No PCP Per Patient    ADMISSION DIAGNOSIS:  Dizziness [R42] Vertigo [R42] Weakness of left lower extremity [R29.898] Cerebral infarction due to unspecified mechanism [I63.9]  DISCHARGE DIAGNOSIS:  Vertigo  SECONDARY DIAGNOSIS:   Past Medical History  Diagnosis Date  . Hypertension   . Stroke ([place])   . Headache     HOSPITAL COURSE:   #1 dizziness : Patient is reporting vertigo likely BPPV meclizine given with some  imrovement Dix-hall-pike maneuver is positive, Epleys neg per PT, PT recommends home PT AND vestibular PT CT is negative for acute CVA. MRI neg Right-sided carotid Dopplers with pouching which needs outpatient surveillance by pcp Monitored pt  on telemetry, no signigiccant changes Echocardiogram 55-60% ejection fraction Physical therapy is recommending home health with PT and vestibular PT Follow up with neurology as needed Outpatient ENT follow-up is recommended Don't drive until seen and evaluated by ENT  #2 hypertension: Blood pressure is excellent on admission. Will hold home antihypertensives to allow for permissive hypertension in the setting of possible stroke.  #3. Confusion: Resolved .UA is negative for infection. No leukocytosis or fever.  #4 prophylaxis: Heparin for DVT prophylaxis. No GI prophylaxis needed at this time Disposition skilled nursing care, and follow-up with case management   DISCHARGE CONDITIONS:   Fair  CONSULTS OBTAINED:      PROCEDURES Dix-hallpike and epleys  DRUG ALLERGIES:  No Known Allergies  DISCHARGE MEDICATIONS:   Current Discharge Medication List    START taking these medications   Details  acetaminophen (TYLENOL) 325 MG tablet  Take 2 tablets (650 mg total) by mouth every 6 (six) hours as needed for moderate pain (headache).    meclizine (ANTIVERT) 25 MG tablet Take 1 tablet (25 mg total) by mouth 3 (three) times daily. Qty: 30 tablet, Refills: 0    nicotine (NICODERM CQ - DOSED IN MG/24 HR) 7 mg/24hr patch Place 1 patch (7 mg total) onto the skin daily. Qty: 28 patch, Refills: 0      CONTINUE these medications which have CHANGED   Details  cyclobenzaprine (FLEXERIL) 5 MG tablet Take 1 tablet (5 mg total) by mouth every 8 (eight) hours as needed for muscle spasms. Qty: 20 tablet, Refills: 0    HYDROcodone-acetaminophen (NORCO) 5-325 MG tablet Take 1 tablet by mouth every 6 (six) hours as needed for moderate pain. Qty: 15 tablet, Refills: 0      CONTINUE these medications which have NOT CHANGED   Details  albuterol (PROVENTIL HFA;VENTOLIN HFA) 108 (90 BASE) MCG/ACT inhaler Inhale 2 puffs into the lungs every 6 (six) hours as needed for wheezing or shortness of breath. Qty: 1 Inhaler, Refills: 2    hydrochlorothiazide (HYDRODIURIL) 25 MG tablet Take 1 tablet (25 mg total) by mouth daily. Qty: 30 tablet, Refills: 0    ibuprofen (ADVIL,MOTRIN) 800 MG tablet Take 1 tablet (800 mg total) by mouth every 8 (eight) hours as needed. Qty: 30 tablet, Refills: 0      STOP taking these medications     guaiFENesin-codeine 100-10 MG/5ML syrup          DISCHARGE INSTRUCTIONS:   Activity as tolerated as recommended by physical therapy, don't drive until seen and evaluated by ENT  Follow-up with  primary care physician in a week Follow-up with ENT in 3-5 days    DI ET:  Low-salt diet  DISCHARGE CONDITION:  Fair  ACTIVITY:  Activity as tolerated as recommended by physical therapy, don't drive until seen and evaluated by ENT  OXYGEN:  Home Oxygen: No.   Oxygen Delivery: room air  DISCHARGE LOCATION:  home   If you experience worsening of your admission symptoms, develop shortness of breath, life  threatening emergency, suicidal or homicidal thoughts you must seek medical attention immediately by calling 911 or calling your MD immediately  if symptoms less severe.  You Must read complete instructions/literature along with all the possible adverse reactions/side effects for all the Medicines you take and that have been prescribed to you. Take any new Medicines after you have completely understood and accpet all the possible adverse reactions/side effects.   Please note  You were cared for by a hospitalist during your hospital stay. If you have any questions about your discharge medications or the care you received while you were in the hospital after you are discharged, you can call the unit and asked to speak with the hospitalist on call if the hospitalist that took care of you is not available. Once you are discharged, your primary care physician will handle any further medical issues. Please note that NO REFILLS for any discharge medications will be authorized once you are discharged, as it is imperative that you return to your primary care physician (or establish a relationship with a primary care physician if you do not have one) for your aftercare needs so that they can reassess your need for medications and monitor your lab values.     Today  Chief Complaint  Patient presents with  . Dizziness   Patient is feeling fine. Dizziness improved but still feeling dizzy. Denies any headache or blurry vision.  ROS:  CONSTITUTIONAL: Denies fevers, chills. Denies any fatigue, weakness.  EYES: Denies blurry vision, double vision, eye pain. EARS, NOSE, THROAT: Denies tinnitus, ear pain, hearing loss. Reporting some vertigo but significantly improved RESPIRATORY: Denies cough, wheeze, shortness of breath.  CARDIOVASCULAR: Denies chest pain, palpitations, edema.  GASTROINTESTINAL: Denies nausea, vomiting, diarrhea, abdominal pain. Denies bright red blood per rectum. GENITOURINARY: Denies  dysuria, hematuria. ENDOCRINE: Denies nocturia or thyroid problems. HEMATOLOGIC AND LYMPHATIC: Denies easy bruising or bleeding. SKIN: Denies rash or lesion. MUSCULOSKELETAL: Denies pain in neck, back, shoulder, knees, hips or arthritic symptoms.  NEUROLOGIC: Denies paralysis, paresthesias.  PSYCHIATRIC: Denies anxiety or depressive symptoms.   VITAL SIGNS:  Blood pressure 126/82, pulse 65, temperature 97.5 F (36.4 C), temperature source Oral, resp. rate 18, height 5\' 1"  (1.549 m), weight 79.379 kg (175 lb), SpO2 100 %.  I/O:    Intake/Output Summary (Last 24 hours) at 04/10/15 1135 Last data filed at 04/09/15 2131  Gross per 24 hour  Intake      0 ml  Output    750 ml  Net   -750 ml    PHYSICAL EXAMINATION:  GENERAL:  49 y.o.-year-old patient lying in the bed with no acute distress.  EYES: Pupils equal, round, reactive to light and accommodation. No scleral icterus. Extraocular muscles intact.  HEENT: Head atraumatic, normocephalic. Oropharynx and nasopharynx clear.  NECK:  Supple, no jugular venous distention. No thyroid enlargement, no tenderness.  LUNGS: Normal breath sounds bilaterally, no wheezing, rales,rhonchi or crepitation. No use of accessory muscles of respiration.  CARDIOVASCULAR: S1, S2 normal. No murmurs, rubs, or gallops.  ABDOMEN: Soft, non-tender, non-distended.  Bowel sounds present. No organomegaly or mass.  EXTREMITIES: No pedal edema, cyanosis, or clubbing.  NEUROLOGIC: Cranial nerves II through XII are intact. Muscle strength 5/5 in all extremities. Sensation intact. Gait not checked.  PSYCHIATRIC: The patient is alert and oriented x 3.  SKIN: No obvious rash, lesion, or ulcer.   DATA REVIEW:   CBC  Recent Labs Lab 04/09/15 0417  WBC 4.9  HGB 11.5*  HCT 34.8*  PLT 260    Chemistries   Recent Labs Lab 04/09/15 0417  NA 138  K 3.8  CL 102  CO2 29  GLUCOSE 109*  BUN 16  CREATININE 0.68  CALCIUM 9.4    Cardiac Enzymes  Recent  Labs Lab 04/08/15 0845  TROPONINI <0.03    Microbiology Results  Results for orders placed or performed during the hospital encounter of 04/07/15  Urine culture     Status: None   Collection Time: 04/07/15  2:45 PM  Result Value Ref Range Status   Specimen Description URINE, RANDOM  Final   Special Requests NONE  Final   Culture INSIGNIFICANT GROWTH  Final   Report Status 04/09/2015 FINAL  Final    RADIOLOGY:  Ct Angio Head W/cm &/or Wo Cm  04/07/2015  CLINICAL DATA:  Dizziness and blurred vision starting yesterday. EXAM: CT ANGIOGRAPHY HEAD AND NECK TECHNIQUE: Multidetector CT imaging of the head and neck was performed using the standard protocol during bolus administration of intravenous contrast. Multiplanar CT image reconstructions and MIPs were obtained to evaluate the vascular anatomy. Carotid stenosis measurements (when applicable) are obtained utilizing NASCET criteria, using the distal internal carotid diameter as the denominator. CONTRAST:  114mL OMNIPAQUE IOHEXOL 350 MG/ML SOLN COMPARISON:  09/03/2013 FINDINGS: CT HEAD Skull and Sinuses:Negative for fracture or destructive process. The mastoids, middle ears, and imaged paranasal sinuses are clear. Orbits: No acute abnormality. Brain: No evidence of acute infarction, hemorrhage, hydrocephalus, or mass lesion/mass effect. CTA NECK Aortic arch: No aneurysm or dissection.  Three vessel branching. Right carotid system: Tiny mural calcification at the bifurcation compatible with early atherosclerosis. No stenosis, dissection, or evidence of vasculopathy. Left carotid system: Flat appearance of the posterior lumen proximal left ICA which is likely from early noncalcified atheromatous plaque. No stenosis, dissection, or evidence of vasculopathy. Vertebral arteries:Proximal subclavian arteries are widely patent. Mild left vertebral artery dominance. Patent vertebral arteries to the dura. No evidence of dissection. Skeleton: Negative Other neck:  No incidentally detected mass or adenopathy in the neck. CTA HEAD Limited by venous contamination. Anterior circulation: Mild atheromatous calcification on the carotid siphon walls bilaterally. There is no evidence of major branch occlusion. No flow limiting stenosis. Conical outpouching from the right supraclinoid carotid artery which extends posteriorly and inferiorly, 3.6 mm in length. No emanating vessel is seen from the tip. Posterior circulation: Mild left vertebral artery dominance. Symmetric vertebral and basilar branching. No occlusion or stenosis to explain symptoms. Negative for aneurysm. Venous sinuses: Patent Anatomic variants: None significant Delayed phase: No parenchymal enhancement. IMPRESSION: 1. No acute finding or explanation for dizziness. 2. Minimal atherosclerosis. 3. 3.6 mm outpouching from the right supraclinoid carotid artery. Morphology favors infundibulum over aneurysm but at this size surveillance is still advised. Electronically Signed   By: Monte Fantasia M.D.   On: 04/07/2015 16:02   Ct Angio Neck W/cm &/or Wo/cm  04/07/2015  CLINICAL DATA:  Dizziness and blurred vision starting yesterday. EXAM: CT ANGIOGRAPHY HEAD AND NECK TECHNIQUE: Multidetector CT imaging of the head and neck was performed using  the standard protocol during bolus administration of intravenous contrast. Multiplanar CT image reconstructions and MIPs were obtained to evaluate the vascular anatomy. Carotid stenosis measurements (when applicable) are obtained utilizing NASCET criteria, using the distal internal carotid diameter as the denominator. CONTRAST:  18mL OMNIPAQUE IOHEXOL 350 MG/ML SOLN COMPARISON:  09/03/2013 FINDINGS: CT HEAD Skull and Sinuses:Negative for fracture or destructive process. The mastoids, middle ears, and imaged paranasal sinuses are clear. Orbits: No acute abnormality. Brain: No evidence of acute infarction, hemorrhage, hydrocephalus, or mass lesion/mass effect. CTA NECK Aortic arch: No  aneurysm or dissection.  Three vessel branching. Right carotid system: Tiny mural calcification at the bifurcation compatible with early atherosclerosis. No stenosis, dissection, or evidence of vasculopathy. Left carotid system: Flat appearance of the posterior lumen proximal left ICA which is likely from early noncalcified atheromatous plaque. No stenosis, dissection, or evidence of vasculopathy. Vertebral arteries:Proximal subclavian arteries are widely patent. Mild left vertebral artery dominance. Patent vertebral arteries to the dura. No evidence of dissection. Skeleton: Negative Other neck: No incidentally detected mass or adenopathy in the neck. CTA HEAD Limited by venous contamination. Anterior circulation: Mild atheromatous calcification on the carotid siphon walls bilaterally. There is no evidence of major branch occlusion. No flow limiting stenosis. Conical outpouching from the right supraclinoid carotid artery which extends posteriorly and inferiorly, 3.6 mm in length. No emanating vessel is seen from the tip. Posterior circulation: Mild left vertebral artery dominance. Symmetric vertebral and basilar branching. No occlusion or stenosis to explain symptoms. Negative for aneurysm. Venous sinuses: Patent Anatomic variants: None significant Delayed phase: No parenchymal enhancement. IMPRESSION: 1. No acute finding or explanation for dizziness. 2. Minimal atherosclerosis. 3. 3.6 mm outpouching from the right supraclinoid carotid artery. Morphology favors infundibulum over aneurysm but at this size surveillance is still advised. Electronically Signed   By: Monte Fantasia M.D.   On: 04/07/2015 16:02   Mr Brain Wo Contrast  04/08/2015  CLINICAL DATA:  Left-sided weakness.  Dizziness. EXAM: MRI HEAD WITHOUT CONTRAST TECHNIQUE: Multiplanar, multiecho pulse sequences of the brain and surrounding structures were obtained without intravenous contrast. COMPARISON:  CT head 04/07/2015 FINDINGS: Ventricle size is  normal. Cerebral volume is normal. No atrophy identified. Pituitary normal in size. Craniocervical junction normal. Negative for acute or chronic infarction Negative for demyelinating disease. Cerebral white matter normal. Basal ganglia and brainstem normal Negative for hemorrhage or mass. No edema or shift of the midline structures. Mucous retention cyst left maxillary sinus. Mild mucosal edema in the paranasal sinuses and mastoid sinus on the left. IMPRESSION: Normal MRI of the brain without contrast Mild chronic sinusitis. Electronically Signed   By: Franchot Gallo M.D.   On: 04/08/2015 19:21   US Carotid Bilateral  04/08/2015  CLINICAL DATA:  Dizziness. History of TIA, hypertension, visual disturbance, syncopal episode, hyperlipidemia and smoking. EXAM: BILATERAL CAROTID DUPLEX ULTRASOUND TECHNIQUE: Pearline Cables scale imaging, color Doppler and duplex ultrasound were performed of bilateral carotid and vertebral arteries in the neck. COMPARISON:  Head CT - 04/07/2015; carotid Doppler ultrasound - 09/04/2013 FINDINGS: Criteria: Quantification of carotid stenosis is based on velocity parameters that correlate the residual internal carotid diameter with NASCET-based stenosis levels, using the diameter of the distal internal carotid lumen as the denominator for stenosis measurement. The following velocity measurements were obtained: RIGHT ICA:  65/30 cm/sec CCA:  84/66 cm/sec SYSTOLIC ICA/CCA RATIO:  0.8 DIASTOLIC ICA/CCA RATIO:  1.5 ECA:  84 cm/sec LEFT ICA:  78/22 cm/sec CCA:  59/93 cm/sec SYSTOLIC ICA/CCA RATIO:  1.0 DIASTOLIC ICA/CCA  RATIO:  1.6 ECA:  54 cm/sec RIGHT CAROTID ARTERY: There is no grayscale evidence of significant intimal thickening or atherosclerotic plaque affecting the interrogated portions of the right carotid system. There are no elevated peak systolic velocities within the interrogated course of the right internal carotid artery to suggest a hemodynamically significant stenosis. RIGHT VERTEBRAL  ARTERY:  Antegrade flow LEFT CAROTID ARTERY: There is no grayscale evidence of significant intimal thickening or atherosclerotic plaque affecting the interrogated portions of the left carotid system. There are no elevated peak systolic velocities within the interrogated course of the left internal carotid artery to suggest a hemodynamically significant stenosis. LEFT VERTEBRAL ARTERY:  Antegrade flow IMPRESSION: Normal carotid Doppler ultrasound. Electronically Signed   By: Sandi Mariscal M.D.   On: 04/08/2015 12:36    EKG:   Orders placed or performed during the hospital encounter of 04/07/15  . EKG 12-Lead  . EKG 12-Lead  . EKG 12-Lead  . EKG 12-Lead      Management plans discussed with the patient, family and they are in agreement.  CODE STATUS:     Code Status Orders        Start     Ordered   04/07/15 1827  Full code   Continuous     04/07/15 1826      TOTAL TIME TAKING CARE OF THIS PATIENT: 45 minutes.    @[facility] @  on 04/10/2015 at 11:35 AM  Between 7am to 6pm - Pager - [phone]  After 6pm go to [email or web address] - password [email or web address] [facility]  [facility]  Office  [phone]  CC: Primary care physician; No PCP Per Patient

## 2015-04-10 NOTE — Plan of Care (Signed)
Problem: Discharge Progression Outcomes Goal: Other Discharge Outcomes/Goals Outcome: Progressing Plan of care progress to goal: VSS Pt tolerating diet Dizziness and gait improved  CT and MRI negative for stroke Pt to f/u with ENT outpt

## 2015-04-10 NOTE — Progress Notes (Signed)
Discussed discharge instructions and medications with pt and her husband.  IVs removed per policy and pt transported via car by friend.  Clarise Cruz, RN

## 2015-04-27 ENCOUNTER — Ambulatory Visit: Payer: Self-pay

## 2015-05-16 ENCOUNTER — Other Ambulatory Visit: Payer: Self-pay

## 2015-05-25 ENCOUNTER — Ambulatory Visit: Payer: Self-pay

## 2015-05-30 ENCOUNTER — Other Ambulatory Visit: Payer: Self-pay

## 2015-05-30 ENCOUNTER — Ambulatory Visit: Payer: Self-pay

## 2015-06-06 ENCOUNTER — Other Ambulatory Visit: Payer: Self-pay

## 2015-06-08 ENCOUNTER — Ambulatory Visit: Payer: Self-pay | Admitting: Ophthalmology

## 2015-07-13 ENCOUNTER — Other Ambulatory Visit: Payer: Self-pay

## 2015-08-15 ENCOUNTER — Ambulatory Visit: Payer: Self-pay

## 2015-08-15 ENCOUNTER — Other Ambulatory Visit: Payer: Self-pay

## 2015-08-17 ENCOUNTER — Ambulatory Visit: Payer: Self-pay | Admitting: Ophthalmology

## 2015-08-22 ENCOUNTER — Other Ambulatory Visit: Payer: Self-pay

## 2015-08-22 DIAGNOSIS — I10 Essential (primary) hypertension: Secondary | ICD-10-CM

## 2015-08-24 LAB — COMPREHENSIVE METABOLIC PANEL
A/G RATIO: 1.4 (ref 1.1–2.5)
ALT: 21 IU/L (ref 0–32)
AST: 19 IU/L (ref 0–40)
Albumin: 4.6 g/dL (ref 3.5–5.5)
Alkaline Phosphatase: 64 IU/L (ref 39–117)
BUN/Creatinine Ratio: 33 — ABNORMAL HIGH (ref 9–23)
BUN: 22 mg/dL (ref 6–24)
Bilirubin Total: <0.2 mg/dL (ref 0.0–1.2)
CALCIUM: 10 mg/dL (ref 8.7–10.2)
CO2: 25 mmol/L (ref 18–29)
Chloride: 101 mmol/L (ref 96–106)
Creatinine, Ser: 0.67 mg/dL (ref 0.57–1.00)
GFR calc Af Amer: 119 mL/min/{1.73_m2} (ref >59)
GFR calc non Af Amer: 103 mL/min/{1.73_m2} (ref >59)
GLOBULIN, TOTAL: 3.3 g/dL (ref 1.5–4.5)
Glucose: 89 mg/dL (ref 65–99)
POTASSIUM: 4.5 mmol/L (ref 3.5–5.2)
SODIUM: 143 mmol/L (ref 134–144)
Total Protein: 7.9 g/dL (ref 6.0–8.5)

## 2015-08-24 LAB — LIPID PANEL
CHOL/HDL RATIO: 6.1 ratio — AB (ref 0.0–4.4)
Cholesterol, Total: 292 mg/dL — ABNORMAL HIGH (ref 100–199)
HDL: 48 mg/dL (ref >39)
LDL CALC: 191 mg/dL — AB (ref 0–99)
TRIGLYCERIDES: 267 mg/dL — AB (ref 0–149)
VLDL Cholesterol Cal: 53 mg/dL — ABNORMAL HIGH (ref 5–40)

## 2015-08-24 LAB — HEMOGLOBIN A1C
Est. average glucose Bld gHb Est-mCnc: 128 mg/dL
Hgb A1c MFr Bld: 6.1 % — ABNORMAL HIGH (ref 4.8–5.6)

## 2015-08-31 ENCOUNTER — Ambulatory Visit: Payer: Self-pay

## 2015-09-18 ENCOUNTER — Other Ambulatory Visit: Payer: Self-pay | Admitting: Nurse Practitioner

## 2015-09-19 ENCOUNTER — Ambulatory Visit: Payer: Self-pay | Admitting: Nurse Practitioner

## 2015-09-19 VITALS — BP 117/80 | HR 78 | Temp 98.3°F | Wt 191.0 lb

## 2015-09-19 DIAGNOSIS — K219 Gastro-esophageal reflux disease without esophagitis: Secondary | ICD-10-CM

## 2015-09-19 DIAGNOSIS — E782 Mixed hyperlipidemia: Secondary | ICD-10-CM

## 2015-09-19 DIAGNOSIS — R7303 Prediabetes: Secondary | ICD-10-CM

## 2015-09-19 DIAGNOSIS — I10 Essential (primary) hypertension: Secondary | ICD-10-CM

## 2015-09-19 LAB — GLUCOSE, POCT (MANUAL RESULT ENTRY): POC Glucose: 98 mg/dl (ref 70–99)

## 2015-09-19 MED ORDER — CYCLOBENZAPRINE HCL 5 MG PO TABS: 5.0000 mg | ORAL_TABLET | Freq: Three times a day (TID) | ORAL | Status: DC | PRN

## 2015-09-19 MED ORDER — HYDROCHLOROTHIAZIDE 25 MG PO TABS: 25.0000 mg | ORAL_TABLET | Freq: Every day | ORAL | Status: DC

## 2015-09-19 MED ORDER — PANTOPRAZOLE SODIUM 40 MG PO TBEC: 40.0000 mg | DELAYED_RELEASE_TABLET | Freq: Every day | ORAL | Status: DC

## 2015-09-19 MED ORDER — ATORVASTATIN CALCIUM 10 MG PO TABS: 10.0000 mg | ORAL_TABLET | Freq: Every day | ORAL | Status: DC

## 2015-09-19 NOTE — Progress Notes (Signed)
   Subjective:    Patient ID: Jocelyn Simon, female    DOB: [birth year], 50 y.o.   MRN: [id number]  Hypertension This is a chronic problem. The current episode started more than 1 month ago. The problem has been gradually worsening since onset. Pertinent negatives include no palpitations. Risk factors for coronary artery disease include dyslipidemia, family history, obesity and sedentary lifestyle. Past treatments include diuretics. Hypertensive end-organ damage includes CVA.  Dizziness Associated symptoms include myalgias and weakness.  Gastroesophageal Reflux She complains of heartburn and water brash. This is a chronic problem. The current episode started more than 1 year ago.  Nicotine Dependence Her urge triggers include company of smokers.    Pt is on plavix, has history of severe dyslipidemia, has not take lipitor for several months, just started taking one week ago, was concerned re:  Possible impact on liver.     Has also gained a significant amount of weight, mainly eating junk food, sweets, .  Smoking one cigarette per day or less.     Review of Systems  Eyes: Negative for visual disturbance.  Respiratory: Negative for apnea and chest tightness.   Cardiovascular: Negative for palpitations and leg swelling.  Gastrointestinal: Positive for heartburn.  Musculoskeletal: Positive for myalgias and back pain.  Neurological: Positive for weakness. Negative for dizziness and seizures.   Ongoing L sided weakness from cva.       Objective:   Physical Exam  Constitutional: She appears well-developed.  Neck: No tracheal deviation present. No thyromegaly present.  Cardiovascular: Normal rate, regular rhythm, normal heart sounds and intact distal pulses.   Pulmonary/Chest: Effort normal and breath sounds normal. She has no wheezes.  Musculoskeletal: She exhibits edema.  Lymphadenopathy:    She has no cervical adenopathy.  Skin: Skin is warm and dry.  Psychiatric: She has a  normal mood and affect.   BBrS mildly diminished as expected in a long term smoker  Trace BLE edema, ending below shin level.     Jocelyn Simon was seen today for hypertension, breast pain, pain, increased appetite, tingling in hands, dizziness, gastroesophageal reflux and nicotine dependence.  Diagnoses and all orders for this visit:  Essential hypertension, benign -     Comp Met (CMET); Future  Prediabetes -     POCT Glucose (CBG)  Mixed hyperlipidemia -     Lipid Profile; Future  Gastroesophageal reflux disease without esophagitis  Other orders -     pantoprazole (PROTONIX) 40 MG tablet; Take 1 tablet (40 mg total) by mouth daily. -     cyclobenzaprine (FLEXERIL) 5 MG tablet; Take 1 tablet (5 mg total) by mouth every 8 (eight) hours as needed for muscle spasms. -     hydrochlorothiazide (HYDRODIURIL) 25 MG tablet; Take 1 tablet (25 mg total) by mouth daily. -     atorvastatin (LIPITOR) 10 MG tablet; Take 1 tablet (10 mg total) by mouth daily.   Pt has never had a mammogram  And has not had a pap smear for at least 10 years, please schedule.    Labs due to be drawn in 3 months, visit after next labs       Assessment & Plan:

## 2015-10-19 ENCOUNTER — Other Ambulatory Visit: Payer: Self-pay

## 2015-10-31 IMAGING — US US CAROTID DUPLEX BILAT
1 series · 13 of 24 positions shown · non-contrast
Comparison: Head CT - 04/07/2015; carotid Doppler ultrasound -
09/04/2013

CLINICAL DATA: Dizziness. History of TIA, hypertension, visual
disturbance, syncopal episode, hyperlipidemia and smoking.

EXAM:
BILATERAL CAROTID DUPLEX ULTRASOUND
TECHNIQUE: Gray scale imaging, color Doppler and duplex ultrasound were
performed of bilateral carotid and vertebral arteries in the neck.

[Series 1: us carotid duplex bilat · 0.06mm/px · 13 of 62 slices shown]
[im 1/62]
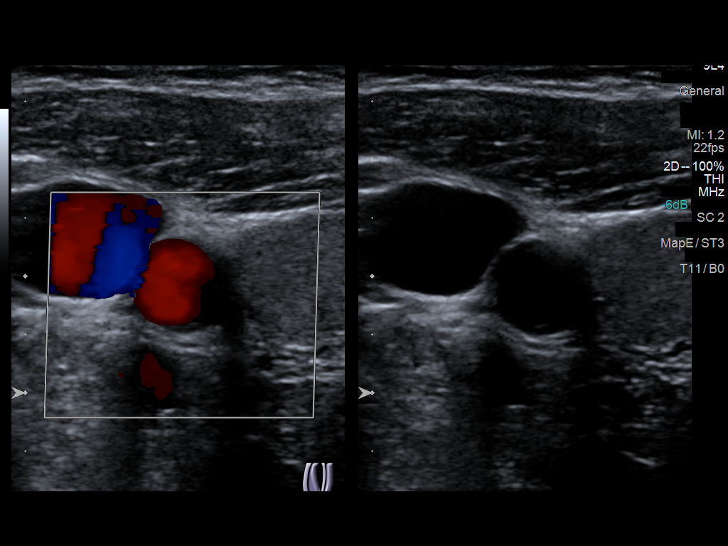
[im 6/62]
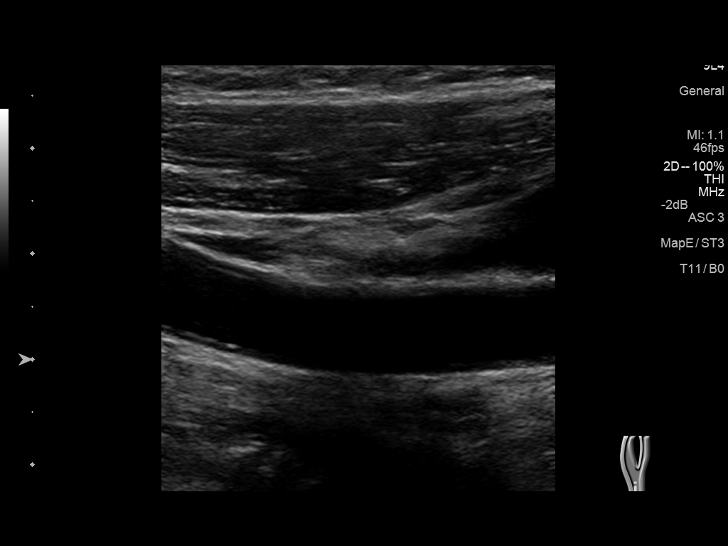
[im 11/62]
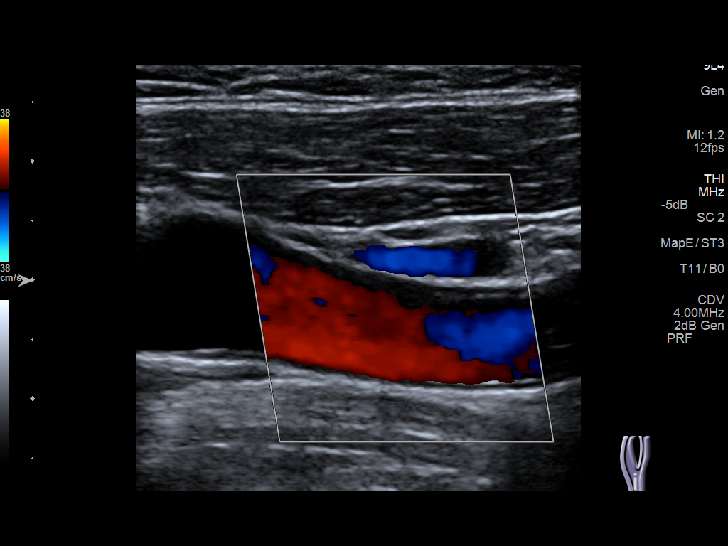
[im 16/62]
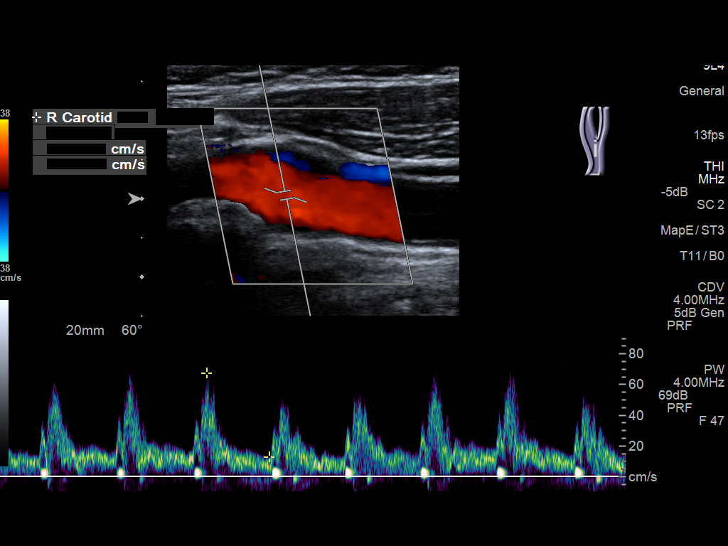
[im 22/62]
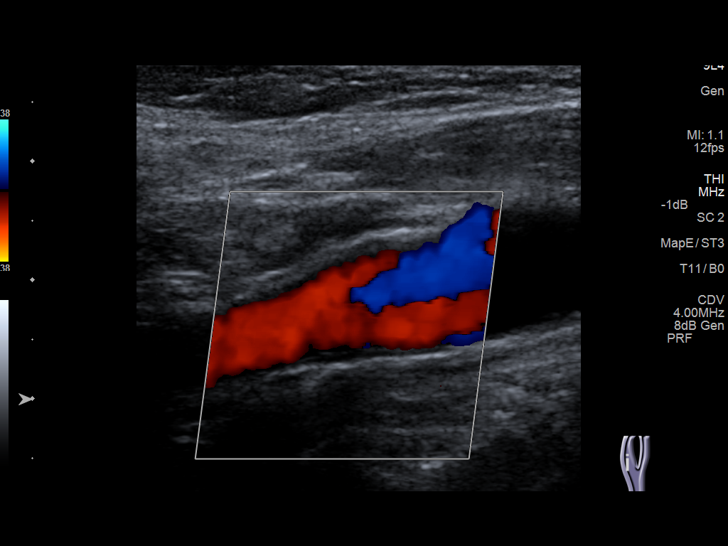
[im 27/62]
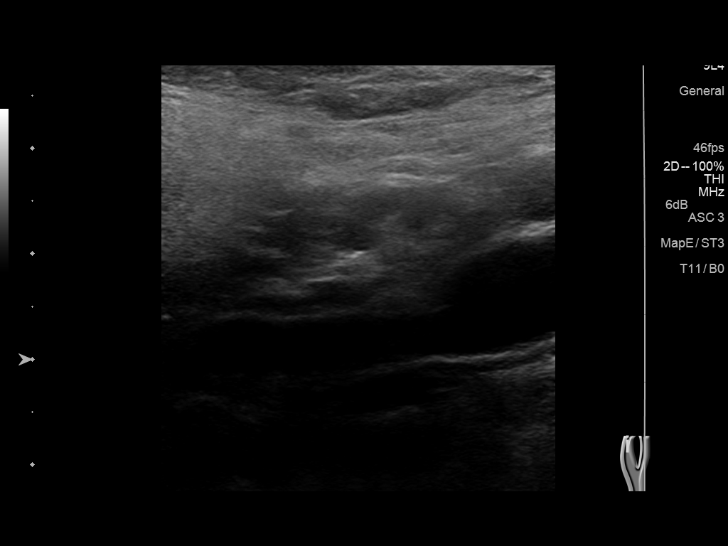
[im 32/62]
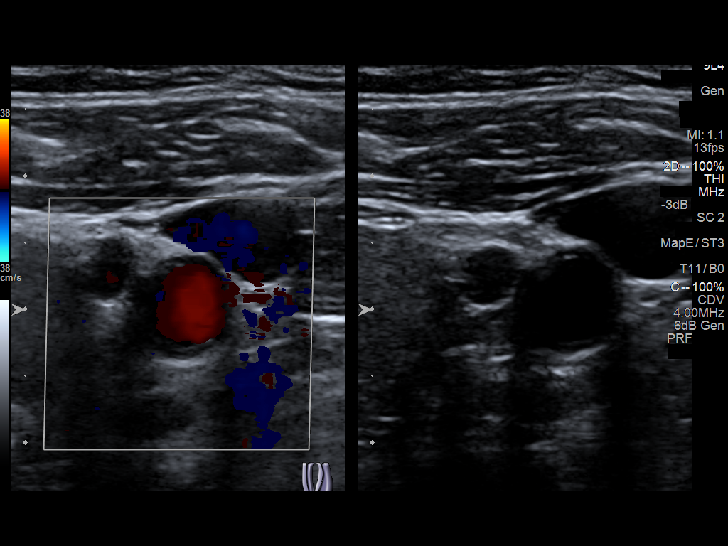
[im 35/62]
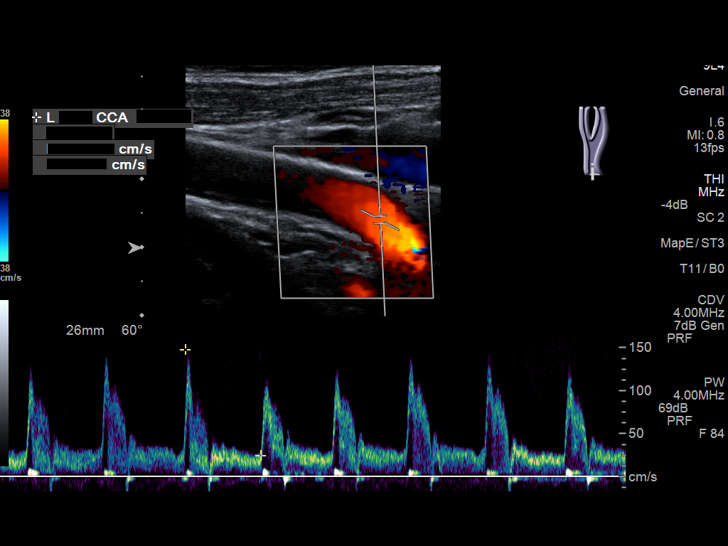
[im 40/62]
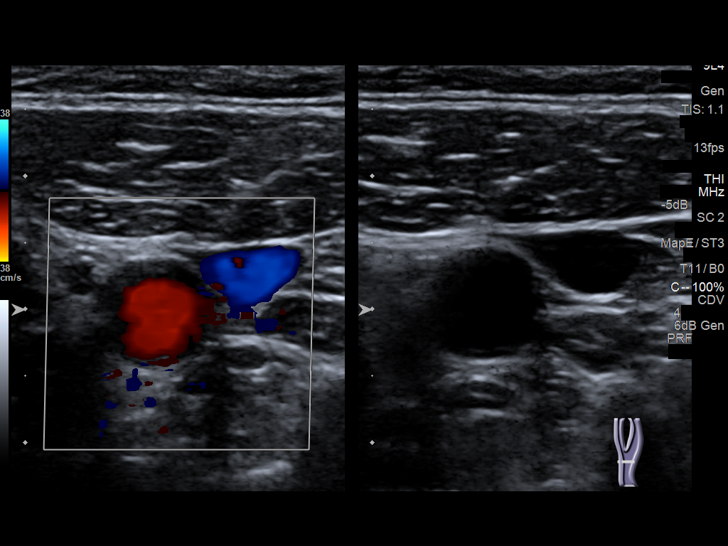
[im 46/62]
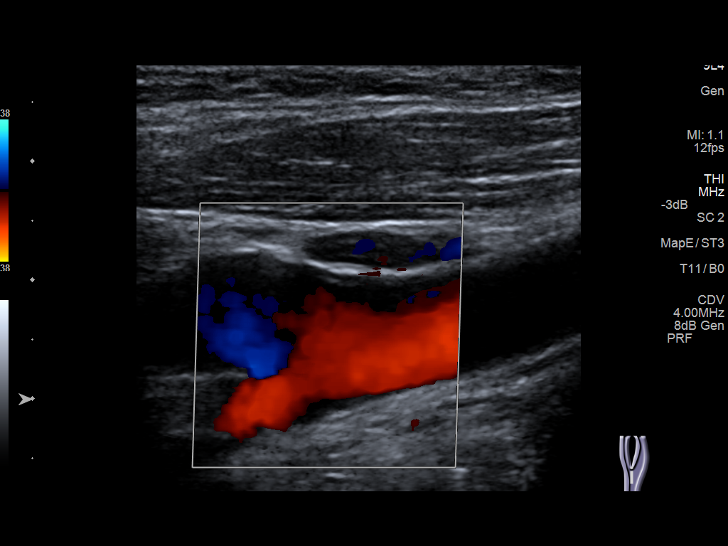
[im 51/62]
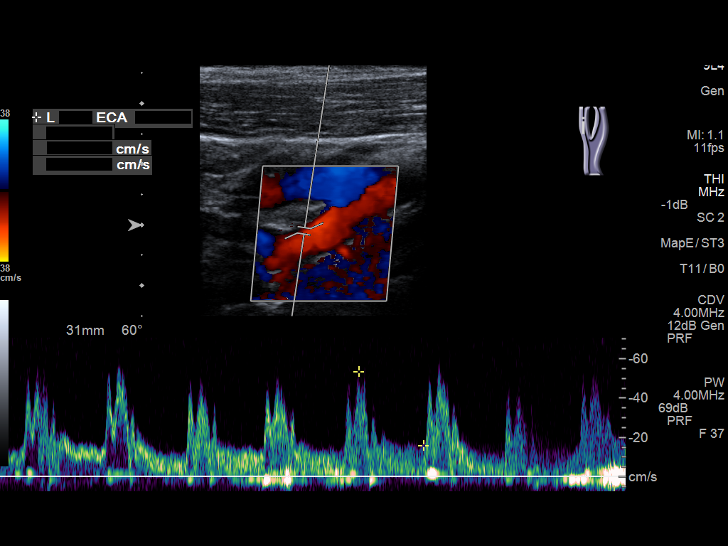
[im 56/62]
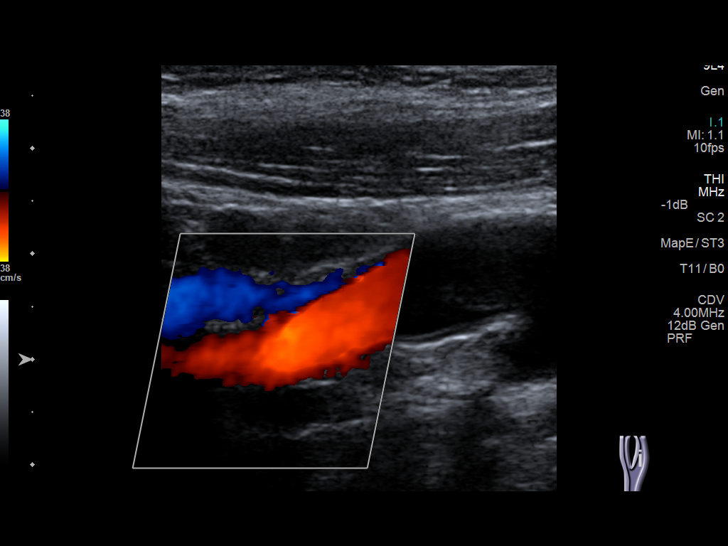
[im 62/62]
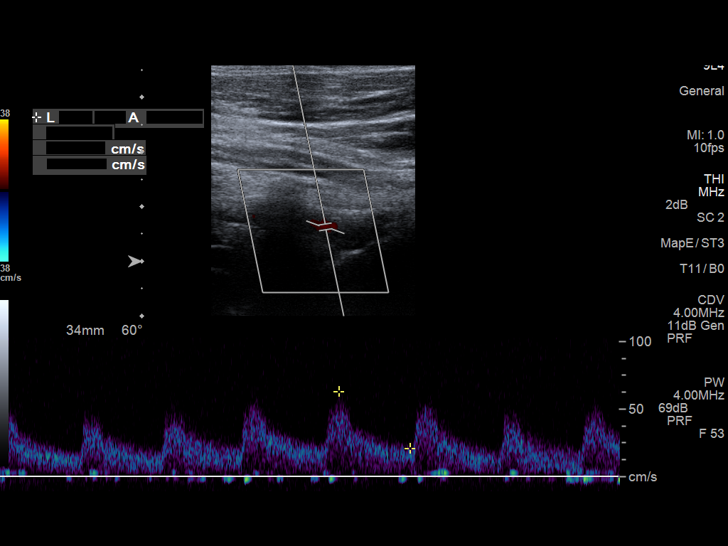

[13 of 24 positions shown; findings below may reference images not displayed]

FINDINGS: Criteria: Quantification of carotid stenosis is based on velocity
parameters that correlate the residual internal carotid diameter
with NASCET-based stenosis levels, using the diameter of the distal
internal carotid lumen as the denominator for stenosis measurement.

The following velocity measurements were obtained:

RIGHT

ICA:  65/30 cm/sec

CCA:  82/20 cm/sec

SYSTOLIC ICA/CCA RATIO:

DIASTOLIC ICA/CCA RATIO:

ECA:  84 cm/sec

LEFT

ICA:  78/22 cm/sec

CCA:  78/36 cm/sec

SYSTOLIC ICA/CCA RATIO:

DIASTOLIC ICA/CCA RATIO:

ECA:  54 cm/sec

RIGHT CAROTID ARTERY: There is no grayscale evidence of significant
intimal thickening or atherosclerotic plaque affecting the
interrogated portions of the right carotid system. There are no
elevated peak systolic velocities within the interrogated course of
the right internal carotid artery to suggest a hemodynamically
significant stenosis.

RIGHT VERTEBRAL ARTERY:  Antegrade flow

LEFT CAROTID ARTERY: There is no grayscale evidence of significant
intimal thickening or atherosclerotic plaque affecting the
interrogated portions of the left carotid system. There are no
elevated peak systolic velocities within the interrogated course of
the left internal carotid artery to suggest a hemodynamically
significant stenosis.

LEFT VERTEBRAL ARTERY:  Antegrade flow
IMPRESSION: Normal carotid Doppler ultrasound.

## 2015-11-01 ENCOUNTER — Other Ambulatory Visit: Payer: Self-pay | Admitting: Nurse Practitioner

## 2015-12-17 ENCOUNTER — Emergency Department: Payer: Self-pay

## 2015-12-17 ENCOUNTER — Emergency Department
Admission: EM | Admit: 2015-12-17 | Discharge: 2015-12-17 | Disposition: A | Discharge status: Home / Self Care | Payer: Self-pay | Attending: Emergency Medicine | Admitting: Emergency Medicine

## 2015-12-17 ENCOUNTER — Encounter: Payer: Self-pay | Admitting: Emergency Medicine

## 2015-12-17 DIAGNOSIS — R110 Nausea: Secondary | ICD-10-CM | POA: Insufficient documentation

## 2015-12-17 DIAGNOSIS — J45909 Unspecified asthma, uncomplicated: Secondary | ICD-10-CM | POA: Insufficient documentation

## 2015-12-17 DIAGNOSIS — Z7982 Long term (current) use of aspirin: Secondary | ICD-10-CM | POA: Insufficient documentation

## 2015-12-17 DIAGNOSIS — N83209 Unspecified ovarian cyst, unspecified side: Secondary | ICD-10-CM

## 2015-12-17 DIAGNOSIS — E785 Hyperlipidemia, unspecified: Secondary | ICD-10-CM | POA: Insufficient documentation

## 2015-12-17 DIAGNOSIS — F17210 Nicotine dependence, cigarettes, uncomplicated: Secondary | ICD-10-CM | POA: Insufficient documentation

## 2015-12-17 DIAGNOSIS — I10 Essential (primary) hypertension: Secondary | ICD-10-CM | POA: Insufficient documentation

## 2015-12-17 DIAGNOSIS — Z79899 Other long term (current) drug therapy: Secondary | ICD-10-CM | POA: Insufficient documentation

## 2015-12-17 DIAGNOSIS — Z8673 Personal history of transient ischemic attack (TIA), and cerebral infarction without residual deficits: Secondary | ICD-10-CM | POA: Insufficient documentation

## 2015-12-17 DIAGNOSIS — N83201 Unspecified ovarian cyst, right side: Secondary | ICD-10-CM | POA: Insufficient documentation

## 2015-12-17 DIAGNOSIS — R1084 Generalized abdominal pain: Secondary | ICD-10-CM

## 2015-12-17 HISTORY — DX: Unspecified asthma, uncomplicated: J45.909

## 2015-12-17 LAB — URINALYSIS COMPLETE WITH MICROSCOPIC (ARMC ONLY)
BACTERIA UA: NONE SEEN
Bilirubin Urine: NEGATIVE
Glucose, UA: NEGATIVE mg/dL
HGB URINE DIPSTICK: NEGATIVE
Ketones, ur: NEGATIVE mg/dL
LEUKOCYTES UA: NEGATIVE
NITRITE: NEGATIVE
PH: 5 (ref 5.0–8.0)
PROTEIN: NEGATIVE mg/dL
SPECIFIC GRAVITY, URINE: 1.017 (ref 1.005–1.030)

## 2015-12-17 LAB — CBC WITH DIFFERENTIAL/PLATELET
BASOS ABS: 0.1 10*3/uL (ref 0–0.1)
Basophils Relative: 1 %
Eosinophils Absolute: 0.4 10*3/uL (ref 0–0.7)
Eosinophils Relative: 7 %
HEMATOCRIT: 36.3 % (ref 35.0–47.0)
HEMOGLOBIN: 12.2 g/dL (ref 12.0–16.0)
Lymphs Abs: 2.2 10*3/uL (ref 1.0–3.6)
MCH: 28 pg (ref 26.0–34.0)
MCHC: 33.5 g/dL (ref 32.0–36.0)
MCV: 83.8 fL (ref 80.0–100.0)
Monocytes Absolute: 0.4 10*3/uL (ref 0.2–0.9)
Monocytes Relative: 6 %
NEUTROS ABS: 2.7 10*3/uL (ref 1.4–6.5)
Neutrophils Relative %: 47 %
Platelets: 280 10*3/uL (ref 150–440)
RBC: 4.34 MIL/uL (ref 3.80–5.20)
RDW: 14.9 % — ABNORMAL HIGH (ref 11.5–14.5)
WBC: 5.7 10*3/uL (ref 3.6–11.0)

## 2015-12-17 LAB — COMPREHENSIVE METABOLIC PANEL
ALT: 27 U/L (ref 14–54)
ANION GAP: 10 (ref 5–15)
AST: 28 U/L (ref 15–41)
Albumin: 4.5 g/dL (ref 3.5–5.0)
Alkaline Phosphatase: 58 U/L (ref 38–126)
BUN: 16 mg/dL (ref 6–20)
CHLORIDE: 100 mmol/L — AB (ref 101–111)
CO2: 29 mmol/L (ref 22–32)
CREATININE: 0.72 mg/dL (ref 0.44–1.00)
Calcium: 9.4 mg/dL (ref 8.9–10.3)
Glucose, Bld: 113 mg/dL — ABNORMAL HIGH (ref 65–99)
Potassium: 3.4 mmol/L — ABNORMAL LOW (ref 3.5–5.1)
SODIUM: 139 mmol/L (ref 135–145)
Total Bilirubin: 0.2 mg/dL — ABNORMAL LOW (ref 0.3–1.2)
Total Protein: 7.6 g/dL (ref 6.5–8.1)

## 2015-12-17 LAB — TROPONIN I: Troponin I: <0.03 ng/mL (ref <0.031)

## 2015-12-17 LAB — LIPASE, BLOOD: Lipase: 28 U/L (ref 11–51)

## 2015-12-17 LAB — POCT PREGNANCY, URINE: PREG TEST UR: NEGATIVE

## 2015-12-17 MED ORDER — TRAMADOL HCL 50 MG PO TABS
ORAL_TABLET | ORAL | Status: AC
  Administered 2015-12-17: 50 mg via ORAL
  Filled 2015-12-17: qty 1

## 2015-12-17 MED ORDER — SODIUM CHLORIDE 0.9 % IV BOLUS (SEPSIS)
500.0000 mL | Freq: Once | INTRAVENOUS | Status: AC
  Administered 2015-12-17: 500 mL via INTRAVENOUS

## 2015-12-17 MED ORDER — CYCLOBENZAPRINE HCL 5 MG PO TABS: 10.0000 mg | ORAL_TABLET | Freq: Three times a day (TID) | ORAL | Status: DC | PRN

## 2015-12-17 MED ORDER — ONDANSETRON HCL 4 MG/2ML IJ SOLN
4.0000 mg | Freq: Once | INTRAMUSCULAR | Status: AC
  Administered 2015-12-17: 4 mg via INTRAVENOUS
  Filled 2015-12-17: qty 2

## 2015-12-17 MED ORDER — HYDROMORPHONE HCL 1 MG/ML IJ SOLN
1.0000 mg | Freq: Once | INTRAMUSCULAR | Status: AC
  Administered 2015-12-17: 1 mg via INTRAVENOUS
  Filled 2015-12-17: qty 1

## 2015-12-17 MED ORDER — CYCLOBENZAPRINE HCL 10 MG PO TABS
ORAL_TABLET | ORAL | Status: AC
  Administered 2015-12-17: 5 mg via ORAL
  Filled 2015-12-17: qty 1

## 2015-12-17 MED ORDER — TRAMADOL HCL 50 MG PO TABS
50.0000 mg | ORAL_TABLET | Freq: Once | ORAL | Status: AC
Start: 2015-12-17 — End: 2015-12-17
  Administered 2015-12-17: 50 mg via ORAL

## 2015-12-17 MED ORDER — IOPAMIDOL (ISOVUE-300) INJECTION 61%
100.0000 mL | Freq: Once | INTRAVENOUS | Status: AC | PRN
  Administered 2015-12-17: 100 mL via INTRAVENOUS

## 2015-12-17 MED ORDER — DIATRIZOATE MEGLUMINE & SODIUM 66-10 % PO SOLN
15.0000 mL | Freq: Once | ORAL | Status: AC
  Administered 2015-12-17: 15 mL via ORAL

## 2015-12-17 MED ORDER — TRAMADOL HCL 50 MG PO TABS: 50.0000 mg | ORAL_TABLET | Freq: Three times a day (TID) | ORAL | Status: DC | PRN

## 2015-12-17 MED ORDER — CYCLOBENZAPRINE HCL 10 MG PO TABS
5.0000 mg | ORAL_TABLET | Freq: Once | ORAL | Status: AC
  Administered 2015-12-17: 5 mg via ORAL

## 2015-12-17 MED ORDER — KETOROLAC TROMETHAMINE 30 MG/ML IJ SOLN
15.0000 mg | Freq: Once | INTRAMUSCULAR | Status: AC
  Administered 2015-12-17: 15 mg via INTRAVENOUS
  Filled 2015-12-17: qty 1

## 2015-12-17 NOTE — ED Provider Notes (Signed)
Time Seen: Approximately 1315 I have reviewed the triage notes  Chief Complaint: Flank Pain and Nausea   History of Present Illness: Jocelyn Simon is a 50 y.o. female *who presents with some diffuse abdominal pain she states primarily*the left flank area and then radiates to the rest of the abdomen. She describes some nausea but no persistent vomiting. She denies any loose stool or diarrhea and states her pain primarily over the last several weeks but got much worse acutely today. She denies any objective fever at home. She is not aware of any exacerbating or alleviating factors other than seems to get worse with movement. She did not take any medication for pain prior to arrival.  Past Medical History  Diagnosis Date  . Hypertension   . Stroke ([place])   . Headache   . Hyperlipidemia   . GERD (gastroesophageal reflux disease)   . Asthma     Patient Active Problem List   Diagnosis Date Noted  . Prediabetes 09/19/2015  . Left-sided weakness 04/07/2015    History reviewed. No pertinent past surgical history.  History reviewed. No pertinent past surgical history.  Current Outpatient Rx  Name  Route  Sig  Dispense  Refill  . acetaminophen (TYLENOL) 325 MG tablet   Oral   Take 2 tablets (650 mg total) by mouth every 6 (six) hours as needed for moderate pain (headache).         Marland Kitchen aspirin EC 81 MG tablet   Oral   Take 81 mg by mouth daily.         Marland Kitchen atorvastatin (LIPITOR) 10 MG tablet   Oral   Take 1 tablet (10 mg total) by mouth daily.   90 tablet   1   . clopidogrel (PLAVIX) 75 MG tablet   Oral   Take 75 mg by mouth daily.         . cyclobenzaprine (FLEXERIL) 5 MG tablet   Oral   Take 1 tablet (5 mg total) by mouth every 8 (eight) hours as needed for muscle spasms.   48 tablet   0   . hydrochlorothiazide (HYDRODIURIL) 25 MG tablet   Oral   Take 1 tablet (25 mg total) by mouth daily.   90 tablet   1   . meclizine (ANTIVERT) 25 MG tablet   Oral   Take  1 tablet (25 mg total) by mouth 3 (three) times daily.   30 tablet   0   . nicotine (NICOTROL) 10 MG inhaler   Inhalation   Inhale 1 continuous puffing into the lungs as needed for smoking cessation (May inhale contents of 6 to 12 cartridges per day, gradually decreasing over time.).         Marland Kitchen pantoprazole (PROTONIX) 40 MG tablet   Oral   Take 1 tablet (40 mg total) by mouth daily.   90 tablet   1   . VENTOLIN HFA 108 (90 Base) MCG/ACT inhaler      INHALE 2 PUFFS EVERY SIX HOURS AS NEEDED FOR COUGH, WHEEZING OR SHORTNESS OF BREATH.   54 g   0   . HYDROcodone-acetaminophen (NORCO) 5-325 MG tablet   Oral   Take 1 tablet by mouth every 6 (six) hours as needed for moderate pain. Patient not taking: Reported on 09/19/2015   15 tablet   0   . nicotine (NICODERM CQ - DOSED IN MG/24 HR) 7 mg/24hr patch   Transdermal   Place 1 patch (7 mg total) onto  the skin daily.   28 patch   0     Allergies:  Review of patient's allergies indicates no known allergies.  Family History: Family History  Problem Relation Age of Onset  . CAD Mother   . CAD Sister   . CVA Other     Social History: Social History  Substance Use Topics  . Smoking status: Current Every Day Smoker -- 0.25 packs/day    Types: Cigarettes  . Smokeless tobacco: Never Used  . Alcohol Use: No     Review of Systems:   10 point review of systems was performed and was otherwise negative:  Constitutional: No fever Eyes: No visual disturbances ENT: No sore throat, ear pain Cardiac: No chest pain Respiratory: No shortness of breath, wheezing, or stridor Abdomen:Patient states pain is primarily across the left upper quadrant as where started now is relatively diffuse. She describes it as constant pain exacerbated by movement but does have dizziness also hurts to take a deep breath. Endocrine: No weight loss, No night sweats Extremities: No peripheral edema, cyanosis Skin: No rashes, easy bruising Neurologic:  No focal weakness, trouble with speech or swollowing Urologic: No dysuria, Hematuria, or urinary frequency Patient denies any vaginal discharge or bleeding  Physical Exam:  ED Triage Vitals  Enc Vitals Group     BP 12/17/15 1049 135/75 mmHg     Pulse Rate 12/17/15 1049 81     Resp 12/17/15 1049 18     Temp 12/17/15 1049 98.2 F (36.8 C)     Temp Source 12/17/15 1049 Oral     SpO2 12/17/15 1049 100 %     Weight 12/17/15 1049 191 lb (86.637 kg)     Height 12/17/15 1049 5\' 1"  (1.549 m)     Head Cir --      Peak Flow --      Pain Score 12/17/15 1049 10     Pain Loc --      Pain Edu? --      Excl. in [place]? --     General: Awake , Alert , and Oriented times 3; GCS 15 Head: Normal cephalic , atraumatic Eyes: Pupils equal , round, reactive to light Nose/Throat: No nasal drainage, patent upper airway without erythema or exudate.  Neck: Supple, Full range of motion, No anterior adenopathy or palpable thyroid masses Lungs: Clear to ascultation without wheezes , rhonchi, or rales Heart: Regular rate, regular rhythm without murmurs , gallops , or rubs Abdomen: SDiffuse tenderness across the abdomen with some abdominal distention without any focal peritoneal signs. No focal tenderness over McBurney's point. Negative Murphy's sign. Most of the pain seems to be reproducible of the left upper quadrant.       Extremities: 2 plus symmetric pulses. No edema, clubbing or cyanosis Neurologic: normal ambulation, Motor symmetric without deficits, sensory intact Skin: warm, dry, no rashes   Labs:   All laboratory work was reviewed including any pertinent negatives or positives listed below:  Labs Reviewed  CBC WITH DIFFERENTIAL/PLATELET - Abnormal; Notable for the following:    RDW 14.9 (*)    All other components within normal limits  URINALYSIS COMPLETEWITH MICROSCOPIC ([facility] ONLY) - Abnormal; Notable for the following:    Color, Urine YELLOW (*)    APPearance CLEAR (*)    Squamous Epithelial  / LPF 0-5 (*)    All other components within normal limits  COMPREHENSIVE METABOLIC PANEL - Abnormal; Notable for the following:    Potassium 3.4 (*)  Chloride 100 (*)    Glucose, Bld 113 (*)    Total Bilirubin 0.2 (*)    All other components within normal limits  LIPASE, BLOOD  TROPONIN I  POC URINE PREG, ED  POCT PREGNANCY, URINE   Laboratory work was reviewed and showed no clinically significant abnormalities.   Radiology:     CT ABDOMEN PELVIS W CONTRAST (Final result) Result time: 12/17/15 14:37:27   Final result by Rad Results In Interface (12/17/15 14:37:27)   Narrative:   CLINICAL DATA: Bilateral flank pain and abdominal pain with nausea.  EXAM: CT ABDOMEN AND PELVIS WITH CONTRAST  TECHNIQUE: Multidetector CT imaging of the abdomen and pelvis was performed using the standard protocol following bolus administration of intravenous contrast.  CONTRAST: 152mL ISOVUE-300 IOPAMIDOL (ISOVUE-300) INJECTION 61%  COMPARISON: None.  FINDINGS: Lower chest: Mild scarring at the left lung base.  Hepatobiliary: Normal liver and gallbladder. No evidence of biliary ductal dilatation.  Pancreas: No mass, inflammatory changes, or other significant abnormality.  Spleen: Within normal limits in size and appearance.  Adrenals/Urinary Tract: The adrenal glands appear normal. There is evidence of mild right-sided hydronephrosis and hydroureter. This appears to be due to a right adnexal abnormality which is described below. No urinary tract calculi identified. There is no evidence of left-sided hydronephrosis. The bladder is unremarkable.  Stomach/Bowel: No obstruction or inflammation. No evidence of appendicitis. No free air, free fluid or abscess identified.  Vascular/Lymphatic: No enlarged lymph nodes are seen. The abdominal aorta is normal in caliber.  Reproductive: Complex cystic abnormality superior to the uterus appears to emanate from the right adnexal  region and measures approximately 6.3 x 4.9 x 5.0 cm. This may be a manifestation of hydrosalpinx or a complex cystic adnexal mass. Recommend further correlation with transabdominal and transvaginal pelvic sonography. Additional tubular cystic abnormality of the left adnexal region also identified having the appearance likely of hydrosalpinx and measuring approximately 2 cm in thickness. Ultrasound would also be helpful to evaluate this finding. The uterus is mildly prominent in size but demonstrates no focal lesions by CT. 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  Other: No hernias identified.  Musculoskeletal: Bony structures are unremarkable. There is mild disc space narrowing at L5-S1. No bony lesions or fractures identified.  IMPRESSION: 1. Mild right hydronephrosis and hydroureter. This appears to be caused by a a right adnexal cystic abnormality. 2. Complex cystic abnormality of the right adnexal region measures just over 6 cm in greatest diameter. This is felt to likely be a manifestation of hydrosalpinx or a complex cystic adnexal mass. Recommend trans abdominal and transvaginal pelvic sonography. 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 3. 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   Electronically Signed By: Aletta Edouard M.D. On: 12/17/2015 14:37          DG Chest 2 View (Final result) Result time: 12/17/15 12:24:25   Final result by Rad Results In Interface (12/17/15 12:24:25)   Narrative:   CLINICAL DATA: Lower chest pain  EXAM: CHEST 2 VIEW  COMPARISON: 11/29/2014 chest radiograph.  FINDINGS: Stable cardiomediastinal silhouette with normal heart size. No pneumothorax. No pleural effusion. Lungs appear clear, with no acute consolidative airspace disease and no pulmonary edema.  IMPRESSION: No active  cardiopulmonary disease.   Electronically Signed By: Ilona Sorrel M.D. On: 12/17/2015 12:24       I  personally reviewed the radiologic studies   ED Course: Patient's stay here showed symptomatic improvement with IV pain medication. Her abdominal CT shows what appears to be a rather large right adnexal cyst the patient's currently undergoing ultrasound evaluation of the system may shows no torsion and etc. Her differential was widespread given the location of her abdominal pain and length of time this occurred. Patient's ultrasound was performed mainly to make sure that the ovarian cyst is not torsed 6/26 Patient had ultrasound pending at time of my departure. Ultrasound did show bilateral non-complicating ovarian cysts and the patient received referral to OB/GYN Assessment:  Ovarian cyst Diffuse abdominal pain      Plan: * Outpatient           Daymon Larsen, MD 12/18/15 1406

## 2015-12-17 NOTE — ED Notes (Signed)
Patient left for ultrasound.

## 2015-12-17 NOTE — ED Provider Notes (Signed)
-----------------------------------------   6:11 PM on 12/17/2015 -----------------------------------------  Care was assumed from Dr. Marcelene Butte at 5 PM pending results of the pelvic ultrasound to rule out torsion. Ultrasound confirms the complex right-sided ovarian cyst as well as left hydrosalpinx which was seen on the CT scan, there is patent ovarian blood flow bilaterally and no evidence of torsion. The patient's pain is well controlled she currently appears comfortable. I discussed the case with Dr. Newman Nip, on call for OB/GYN and she agrees that the patient can be discharged with outpatient follow-up. I discussed this with the patient, we discussed meticulous return precautions, need for close follow-up and she is comfortable with the discharge plan. I will discharge her with Ultram as well as some Flexeril. She is comfortable with the discharge plan.  Joanne Gavel, MD 12/17/15 [phone]

## 2015-12-17 NOTE — ED Notes (Signed)
Patient transported to CT scan . 

## 2015-12-17 NOTE — ED Notes (Signed)
E-signature pad is not working. Patient unable to sign.

## 2015-12-17 NOTE — ED Notes (Signed)
Pt presents to ED with reports of bilateral flank pain that radiates to abdomen. Pt reports nausea but denies vomiting and diarrhea. Pt states has had the pain for several weeks but the pain is worse today.

## 2015-12-20 ENCOUNTER — Emergency Department: Payer: Medicaid Other

## 2015-12-20 ENCOUNTER — Emergency Department: Payer: Medicaid Other | Admitting: Anesthesiology

## 2015-12-20 ENCOUNTER — Observation Stay
Admission: EM | Admit: 2015-12-20 | Discharge: 2015-12-22 | Disposition: A | Discharge status: Home / Self Care | Payer: Medicaid Other | Attending: Obstetrics and Gynecology | Admitting: Obstetrics and Gynecology

## 2015-12-20 ENCOUNTER — Encounter
Admission: EM | Disposition: A | Discharge status: Home / Self Care | Payer: Self-pay | Source: Home / Self Care | Attending: Emergency Medicine

## 2015-12-20 ENCOUNTER — Encounter: Payer: Self-pay | Admitting: Obstetrics and Gynecology

## 2015-12-20 DIAGNOSIS — J4530 Mild persistent asthma, uncomplicated: Secondary | ICD-10-CM

## 2015-12-20 DIAGNOSIS — J45909 Unspecified asthma, uncomplicated: Secondary | ICD-10-CM | POA: Diagnosis not present

## 2015-12-20 DIAGNOSIS — Z8673 Personal history of transient ischemic attack (TIA), and cerebral infarction without residual deficits: Secondary | ICD-10-CM | POA: Insufficient documentation

## 2015-12-20 DIAGNOSIS — Z6836 Body mass index (BMI) 36.0-36.9, adult: Secondary | ICD-10-CM | POA: Diagnosis not present

## 2015-12-20 DIAGNOSIS — R7303 Prediabetes: Secondary | ICD-10-CM | POA: Insufficient documentation

## 2015-12-20 DIAGNOSIS — D271 Benign neoplasm of left ovary: Secondary | ICD-10-CM | POA: Diagnosis not present

## 2015-12-20 DIAGNOSIS — I10 Essential (primary) hypertension: Secondary | ICD-10-CM | POA: Diagnosis not present

## 2015-12-20 DIAGNOSIS — Z79899 Other long term (current) drug therapy: Secondary | ICD-10-CM | POA: Diagnosis not present

## 2015-12-20 DIAGNOSIS — N83201 Unspecified ovarian cyst, right side: Secondary | ICD-10-CM

## 2015-12-20 DIAGNOSIS — D270 Benign neoplasm of right ovary: Secondary | ICD-10-CM | POA: Insufficient documentation

## 2015-12-20 DIAGNOSIS — K219 Gastro-esophageal reflux disease without esophagitis: Secondary | ICD-10-CM | POA: Diagnosis not present

## 2015-12-20 DIAGNOSIS — Z8249 Family history of ischemic heart disease and other diseases of the circulatory system: Secondary | ICD-10-CM | POA: Diagnosis not present

## 2015-12-20 DIAGNOSIS — Z7982 Long term (current) use of aspirin: Secondary | ICD-10-CM | POA: Insufficient documentation

## 2015-12-20 DIAGNOSIS — R102 Pelvic and perineal pain: Secondary | ICD-10-CM | POA: Diagnosis present

## 2015-12-20 DIAGNOSIS — N7011 Chronic salpingitis: Secondary | ICD-10-CM

## 2015-12-20 DIAGNOSIS — F17210 Nicotine dependence, cigarettes, uncomplicated: Secondary | ICD-10-CM | POA: Diagnosis not present

## 2015-12-20 DIAGNOSIS — Z823 Family history of stroke: Secondary | ICD-10-CM | POA: Diagnosis not present

## 2015-12-20 DIAGNOSIS — N8302 Follicular cyst of left ovary: Secondary | ICD-10-CM | POA: Diagnosis not present

## 2015-12-20 DIAGNOSIS — E785 Hyperlipidemia, unspecified: Secondary | ICD-10-CM | POA: Insufficient documentation

## 2015-12-20 DIAGNOSIS — R51 Headache: Secondary | ICD-10-CM | POA: Insufficient documentation

## 2015-12-20 HISTORY — PX: LAPAROSCOPIC SALPINGO OOPHERECTOMY: SHX5927

## 2015-12-20 LAB — URINALYSIS COMPLETE WITH MICROSCOPIC (ARMC ONLY)
Bilirubin Urine: NEGATIVE
GLUCOSE, UA: NEGATIVE mg/dL
HGB URINE DIPSTICK: NEGATIVE
KETONES UR: NEGATIVE mg/dL
LEUKOCYTES UA: NEGATIVE
NITRITE: NEGATIVE
Protein, ur: NEGATIVE mg/dL
SPECIFIC GRAVITY, URINE: 1.02 (ref 1.005–1.030)
pH: 5 (ref 5.0–8.0)

## 2015-12-20 LAB — CBC WITH DIFFERENTIAL/PLATELET
BASOS PCT: 1 %
Basophils Absolute: 0.1 10*3/uL (ref 0–0.1)
EOS ABS: 0.3 10*3/uL (ref 0–0.7)
EOS PCT: 5 %
HCT: 32.9 % — ABNORMAL LOW (ref 35.0–47.0)
Hemoglobin: 11.4 g/dL — ABNORMAL LOW (ref 12.0–16.0)
Lymphocytes Relative: 34 %
Lymphs Abs: 1.9 10*3/uL (ref 1.0–3.6)
MCH: 29 pg (ref 26.0–34.0)
MCHC: 34.7 g/dL (ref 32.0–36.0)
MCV: 83.6 fL (ref 80.0–100.0)
MONO ABS: 0.5 10*3/uL (ref 0.2–0.9)
MONOS PCT: 10 %
NEUTROS PCT: 50 %
Neutro Abs: 2.8 10*3/uL (ref 1.4–6.5)
PLATELETS: 273 10*3/uL (ref 150–440)
RBC: 3.93 MIL/uL (ref 3.80–5.20)
RDW: 14.9 % — AB (ref 11.5–14.5)
WBC: 5.7 10*3/uL (ref 3.6–11.0)

## 2015-12-20 LAB — COMPREHENSIVE METABOLIC PANEL
ALBUMIN: 4.1 g/dL (ref 3.5–5.0)
ALT: 22 U/L (ref 14–54)
ANION GAP: 8 (ref 5–15)
AST: 19 U/L (ref 15–41)
Alkaline Phosphatase: 63 U/L (ref 38–126)
BUN: 19 mg/dL (ref 6–20)
CO2: 25 mmol/L (ref 22–32)
Calcium: 9.2 mg/dL (ref 8.9–10.3)
Chloride: 102 mmol/L (ref 101–111)
Creatinine, Ser: 0.72 mg/dL (ref 0.44–1.00)
GFR calc non Af Amer: >60 mL/min (ref >60)
GLUCOSE: 113 mg/dL — AB (ref 65–99)
POTASSIUM: 3.7 mmol/L (ref 3.5–5.1)
SODIUM: 135 mmol/L (ref 135–145)
Total Bilirubin: 0.3 mg/dL (ref 0.3–1.2)
Total Protein: 7.6 g/dL (ref 6.5–8.1)

## 2015-12-20 LAB — POCT PREGNANCY, URINE: Preg Test, Ur: NEGATIVE

## 2015-12-20 SURGERY — SALPINGO-OOPHORECTOMY, LAPAROSCOPIC
Anesthesia: General | Laterality: Bilateral | Wound class: Clean Contaminated

## 2015-12-20 MED ORDER — FENTANYL CITRATE (PF) 100 MCG/2ML IJ SOLN
INTRAMUSCULAR | Status: DC | PRN
  Administered 2015-12-20: 50 ug via INTRAVENOUS
  Administered 2015-12-20: 100 ug via INTRAVENOUS

## 2015-12-20 MED ORDER — LACTATED RINGERS IV SOLN
INTRAVENOUS | Status: DC | PRN
  Administered 2015-12-20: 17:00:00 via INTRAVENOUS

## 2015-12-20 MED ORDER — ONDANSETRON HCL 4 MG/2ML IJ SOLN: 4.0000 mg | Freq: Once | INTRAMUSCULAR | Status: DC | PRN

## 2015-12-20 MED ORDER — OXYCODONE-ACETAMINOPHEN 5-325 MG PO TABS
1.0000 | ORAL_TABLET | ORAL | Status: DC | PRN
  Administered 2015-12-20 – 2015-12-22 (×8): 2 via ORAL
  Filled 2015-12-20 (×8): qty 2

## 2015-12-20 MED ORDER — HYDROMORPHONE HCL 1 MG/ML IJ SOLN
0.5000 mg | Freq: Once | INTRAMUSCULAR | Status: AC
  Administered 2015-12-20: 0.5 mg via INTRAVENOUS
  Filled 2015-12-20: qty 1

## 2015-12-20 MED ORDER — ONDANSETRON HCL 4 MG/2ML IJ SOLN
4.0000 mg | Freq: Four times a day (QID) | INTRAMUSCULAR | Status: DC | PRN
  Administered 2015-12-21: 4 mg via INTRAVENOUS
  Filled 2015-12-20: qty 2

## 2015-12-20 MED ORDER — CLOPIDOGREL BISULFATE 75 MG PO TABS
75.0000 mg | ORAL_TABLET | Freq: Every day | ORAL | Status: DC
  Administered 2015-12-20 – 2015-12-22 (×3): 75 mg via ORAL
  Filled 2015-12-20 (×3): qty 1

## 2015-12-20 MED ORDER — MIDAZOLAM HCL 5 MG/5ML IJ SOLN
INTRAMUSCULAR | Status: DC | PRN
  Administered 2015-12-20: 2 mg via INTRAVENOUS

## 2015-12-20 MED ORDER — PROPOFOL 10 MG/ML IV BOLUS
INTRAVENOUS | Status: DC | PRN
  Administered 2015-12-20: 160 mg via INTRAVENOUS

## 2015-12-20 MED ORDER — ONDANSETRON HCL 4 MG/2ML IJ SOLN
INTRAMUSCULAR | Status: DC | PRN
  Administered 2015-12-20: 4 mg via INTRAVENOUS

## 2015-12-20 MED ORDER — SUGAMMADEX SODIUM 200 MG/2ML IV SOLN
INTRAVENOUS | Status: DC | PRN
  Administered 2015-12-20: 346.4 mg via INTRAVENOUS

## 2015-12-20 MED ORDER — NICOTINE 10 MG IN INHA: 1.0000 | RESPIRATORY_TRACT | Status: DC | PRN

## 2015-12-20 MED ORDER — ACETAMINOPHEN 10 MG/ML IV SOLN
1000.0000 mg | Freq: Four times a day (QID) | INTRAVENOUS | Status: DC
  Filled 2015-12-20 (×3): qty 100

## 2015-12-20 MED ORDER — HYDROMORPHONE HCL 1 MG/ML IJ SOLN
1.0000 mg | Freq: Once | INTRAMUSCULAR | Status: AC
  Administered 2015-12-20: 1 mg via INTRAVENOUS
  Filled 2015-12-20: qty 1

## 2015-12-20 MED ORDER — ACETAMINOPHEN 325 MG PO TABS: 650.0000 mg | ORAL_TABLET | Freq: Four times a day (QID) | ORAL | Status: DC | PRN

## 2015-12-20 MED ORDER — ONDANSETRON HCL 4 MG PO TABS: 4.0000 mg | ORAL_TABLET | Freq: Four times a day (QID) | ORAL | Status: DC | PRN

## 2015-12-20 MED ORDER — ONDANSETRON HCL 4 MG/2ML IJ SOLN
4.0000 mg | Freq: Once | INTRAMUSCULAR | Status: AC
  Administered 2015-12-20: 4 mg via INTRAVENOUS
  Filled 2015-12-20: qty 2

## 2015-12-20 MED ORDER — KETOROLAC TROMETHAMINE 30 MG/ML IJ SOLN: 30.0000 mg | Freq: Once | INTRAMUSCULAR | Status: DC

## 2015-12-20 MED ORDER — FENTANYL CITRATE (PF) 100 MCG/2ML IJ SOLN
25.0000 ug | INTRAMUSCULAR | Status: DC | PRN
Start: 2015-12-20 — End: 2015-12-20

## 2015-12-20 MED ORDER — FENTANYL CITRATE (PF) 100 MCG/2ML IJ SOLN
25.0000 ug | INTRAMUSCULAR | Status: DC | PRN
  Administered 2015-12-20 (×4): 25 ug via INTRAVENOUS

## 2015-12-20 MED ORDER — FENTANYL CITRATE (PF) 100 MCG/2ML IJ SOLN
INTRAMUSCULAR | Status: AC
  Filled 2015-12-20: qty 2

## 2015-12-20 MED ORDER — PANTOPRAZOLE SODIUM 40 MG PO TBEC
40.0000 mg | DELAYED_RELEASE_TABLET | Freq: Every day | ORAL | Status: DC
  Administered 2015-12-20 – 2015-12-22 (×3): 40 mg via ORAL
  Filled 2015-12-20 (×3): qty 1

## 2015-12-20 MED ORDER — SODIUM CHLORIDE 0.9 % IV SOLN
1000.0000 mL | Freq: Once | INTRAVENOUS | Status: AC
  Administered 2015-12-20: 1000 mL via INTRAVENOUS

## 2015-12-20 MED ORDER — KETOROLAC TROMETHAMINE 30 MG/ML IJ SOLN
INTRAMUSCULAR | Status: DC | PRN
  Administered 2015-12-20: 30 mg via INTRAVENOUS

## 2015-12-20 MED ORDER — CYCLOBENZAPRINE HCL 10 MG PO TABS
10.0000 mg | ORAL_TABLET | Freq: Three times a day (TID) | ORAL | Status: DC | PRN
  Administered 2015-12-20 – 2015-12-21 (×2): 10 mg via ORAL
  Filled 2015-12-20 (×2): qty 1

## 2015-12-20 MED ORDER — HYDROMORPHONE HCL 1 MG/ML IJ SOLN
0.2000 mg | INTRAMUSCULAR | Status: DC | PRN
  Administered 2015-12-20 (×2): 0.6 mg via INTRAVENOUS
  Filled 2015-12-20 (×2): qty 1

## 2015-12-20 MED ORDER — HYDROMORPHONE HCL 1 MG/ML IJ SOLN
1.0000 mg | Freq: Once | INTRAMUSCULAR | Status: AC
Start: 2015-12-20 — End: 2015-12-20
  Administered 2015-12-20: 1 mg via INTRAVENOUS
  Filled 2015-12-20: qty 1

## 2015-12-20 MED ORDER — ALBUTEROL SULFATE (2.5 MG/3ML) 0.083% IN NEBU
2.5000 mg | INHALATION_SOLUTION | Freq: Four times a day (QID) | RESPIRATORY_TRACT | Status: DC | PRN
Start: 2015-12-20 — End: 2015-12-22
  Administered 2015-12-21 – 2015-12-22 (×2): 2.5 mg via RESPIRATORY_TRACT
  Filled 2015-12-20 (×3): qty 3

## 2015-12-20 MED ORDER — HYDROCHLOROTHIAZIDE 25 MG PO TABS
25.0000 mg | ORAL_TABLET | Freq: Every day | ORAL | Status: DC
  Administered 2015-12-20 – 2015-12-22 (×3): 25 mg via ORAL
  Filled 2015-12-20 (×3): qty 1

## 2015-12-20 MED ORDER — DEXAMETHASONE SODIUM PHOSPHATE 10 MG/ML IJ SOLN
INTRAMUSCULAR | Status: DC | PRN
  Administered 2015-12-20: 5 mg via INTRAVENOUS

## 2015-12-20 MED ORDER — METHYLENE BLUE 0.5 % INJ SOLN
INTRAVENOUS | Status: AC
  Filled 2015-12-20: qty 10

## 2015-12-20 MED ORDER — NICOTINE POLACRILEX 2 MG MT GUM
2.0000 mg | CHEWING_GUM | OROMUCOSAL | Status: DC | PRN
  Administered 2015-12-20: 2 mg via ORAL
  Filled 2015-12-20 (×2): qty 1

## 2015-12-20 MED ORDER — BUPIVACAINE HCL 0.5 % IJ SOLN
INTRAMUSCULAR | Status: DC | PRN
  Administered 2015-12-20: 11 mL

## 2015-12-20 MED ORDER — DOCUSATE SODIUM 100 MG PO CAPS
100.0000 mg | ORAL_CAPSULE | Freq: Two times a day (BID) | ORAL | Status: DC
  Administered 2015-12-20 – 2015-12-22 (×4): 100 mg via ORAL
  Filled 2015-12-20 (×4): qty 1

## 2015-12-20 MED ORDER — MENTHOL 3 MG MT LOZG: 1.0000 | LOZENGE | OROMUCOSAL | Status: DC | PRN

## 2015-12-20 MED ORDER — SUCCINYLCHOLINE CHLORIDE 20 MG/ML IJ SOLN
INTRAMUSCULAR | Status: DC | PRN
  Administered 2015-12-20: 120 mg via INTRAVENOUS

## 2015-12-20 MED ORDER — SODIUM CHLORIDE 0.9 % IR SOLN
Status: DC | PRN
  Administered 2015-12-20: 200 mL

## 2015-12-20 MED ORDER — IBUPROFEN 600 MG PO TABS
600.0000 mg | ORAL_TABLET | Freq: Four times a day (QID) | ORAL | Status: DC | PRN
  Administered 2015-12-21 – 2015-12-22 (×3): 600 mg via ORAL
  Filled 2015-12-20 (×3): qty 1

## 2015-12-20 MED ORDER — LIDOCAINE HCL (CARDIAC) 20 MG/ML IV SOLN
INTRAVENOUS | Status: DC | PRN
  Administered 2015-12-20: 60 mg via INTRAVENOUS

## 2015-12-20 MED ORDER — ROCURONIUM BROMIDE 100 MG/10ML IV SOLN
INTRAVENOUS | Status: DC | PRN
  Administered 2015-12-20 (×3): 10 mg via INTRAVENOUS
  Administered 2015-12-20: 20 mg via INTRAVENOUS

## 2015-12-20 MED ORDER — BUPIVACAINE HCL (PF) 0.5 % IJ SOLN
INTRAMUSCULAR | Status: AC
  Filled 2015-12-20: qty 30

## 2015-12-20 MED ORDER — LACTATED RINGERS IV SOLN
INTRAVENOUS | Status: DC
  Administered 2015-12-21 – 2015-12-22 (×4): via INTRAVENOUS

## 2015-12-20 SURGICAL SUPPLY — 48 items
BAG URO DRAIN 2000ML W/SPOUT (MISCELLANEOUS) IMPLANT
BLADE SURG SZ11 CARB STEEL (BLADE) ×3 IMPLANT
CATH FOLEY 2WAY  5CC 16FR (CATHETERS) ×2
CATH ROBINSON RED A/P 16FR (CATHETERS) IMPLANT
CATH URTH 16FR FL 2W BLN LF (CATHETERS) ×1 IMPLANT
CHLORAPREP W/TINT 26ML (MISCELLANEOUS) ×3 IMPLANT
CLOSURE WOUND 1/4X4 (GAUZE/BANDAGES/DRESSINGS) ×1
DEFOGGER SCOPE WARMER CLEARIFY (MISCELLANEOUS) ×3 IMPLANT
DRSG TEGADERM 2-3/8X2-3/4 SM (GAUZE/BANDAGES/DRESSINGS) IMPLANT
ENDOPOUCH RETRIEVER 10 (MISCELLANEOUS) ×3 IMPLANT
GAUZE SPONGE NON-WVN 2X2 STRL (MISCELLANEOUS) IMPLANT
GLOVE BIO SURGEON STRL SZ 6.5 (GLOVE) ×6 IMPLANT
GLOVE BIO SURGEON STRL SZ8 (GLOVE) ×3 IMPLANT
GLOVE BIO SURGEONS STRL SZ 6.5 (GLOVE) ×3
GLOVE INDICATOR 7.0 STRL GRN (GLOVE) ×6 IMPLANT
GOWN STRL REUS W/ TWL LRG LVL3 (GOWN DISPOSABLE) ×2 IMPLANT
GOWN STRL REUS W/ TWL XL LVL3 (GOWN DISPOSABLE) ×2 IMPLANT
GOWN STRL REUS W/TWL LRG LVL3 (GOWN DISPOSABLE) ×4
GOWN STRL REUS W/TWL XL LVL3 (GOWN DISPOSABLE) ×4
IRRIGATION STRYKERFLOW (MISCELLANEOUS) ×1 IMPLANT
IRRIGATOR STRYKERFLOW (MISCELLANEOUS) ×3
IV NS 1000ML (IV SOLUTION) ×2
IV NS 1000ML BAXH (IV SOLUTION) ×1 IMPLANT
IV SOD CHL 0.9% 1000ML (IV SOLUTION) ×3 IMPLANT
KIT RM TURNOVER CYSTO AR (KITS) ×3 IMPLANT
LABEL OR SOLS (LABEL) IMPLANT
LIGASURE 5MM LAPAROSCOPIC (INSTRUMENTS) IMPLANT
LIGASURE BLUNT 5MM 37CM (INSTRUMENTS) ×3 IMPLANT
LIQUID BAND (GAUZE/BANDAGES/DRESSINGS) ×3 IMPLANT
NS IRRIG 500ML POUR BTL (IV SOLUTION) ×3 IMPLANT
PACK GYN LAPAROSCOPIC (MISCELLANEOUS) ×3 IMPLANT
PAD OB MATERNITY 4.3X12.25 (PERSONAL CARE ITEMS) ×3 IMPLANT
PAD PREP 24X41 OB/GYN DISP (PERSONAL CARE ITEMS) ×3 IMPLANT
SCISSORS METZENBAUM CVD 33 (INSTRUMENTS) IMPLANT
SLEEVE ENDOPATH XCEL 5M (ENDOMECHANICALS) ×3 IMPLANT
SPONGE VERSALON 2X2 STRL (MISCELLANEOUS)
STRIP CLOSURE SKIN 1/4X4 (GAUZE/BANDAGES/DRESSINGS) ×2 IMPLANT
SUT MNCRL AB 4-0 PS2 18 (SUTURE) ×3 IMPLANT
SUT VIC AB 2-0 UR6 27 (SUTURE) ×3 IMPLANT
SUT VIC AB 4-0 SH 27 (SUTURE) ×2
SUT VIC AB 4-0 SH 27XANBCTRL (SUTURE) ×1 IMPLANT
SWABSTK COMLB BENZOIN TINCTURE (MISCELLANEOUS) IMPLANT
TISSUE RETRIEVAL SYSTEM ×3 IMPLANT
TRAP MUCOUS 40ML (MISCELLANEOUS) ×3 IMPLANT
TROCAR ENDO BLADELESS 11MM (ENDOMECHANICALS) ×3 IMPLANT
TROCAR XCEL NON-BLD 5MMX100MML (ENDOMECHANICALS) ×3 IMPLANT
TROCAR XCEL UNIV SLVE 11M 100M (ENDOMECHANICALS) IMPLANT
TUBING INSUFFLATOR HI FLOW (MISCELLANEOUS) ×3 IMPLANT

## 2015-12-20 NOTE — Op Note (Signed)
Jocelyn Simon PROCEDURE DATE: 12/20/2015  PREOPERATIVE DIAGNOSIS: Acute pelvic pain, bilateral complex ovarian cyst POSTOPERATIVE DIAGNOSIS: same plus left hydrosalpingx PROCEDURE: Diagnostic laparoscopy, BSO, pelvic washings SURGEON:  Dr. Benjaman Kindler ASSISTANT: Dr. Marcello Moores Schermerhorn ANESTHESIOLOGIST: Gijsbertus F [place] Service, MD Anesthesiologist: Iver Nestle, MD CRNA: Lendon Colonel, CRNA; Dionne Bucy, CRNA  INDICATIONS: 50 y.o. [phone] with history of 4 weeks of acute pelvic pain with complex ovarian cysts bilaterally.   Please see preoperative notes for further details.  Risks of surgery were discussed with the patient including but not limited to: bleeding which may require transfusion or reoperation; infection which may require antibiotics; injury to bowel, bladder, ureters or other surrounding organs; need for additional procedures including laparotomy; thromboembolic phenomenon, incisional problems and other postoperative/anesthesia complications. Written informed consent was obtained.    FINDINGS:  Small uterus. Bilateral nodular ovaries without adhesions, right much larger than left with apparent simple cystic structures and firm nodules throughout. Normal right Fallopian tube, enlarged left fallopian tube.  No other abdominal/pelvic abnormality.  Normal upper abdomen.  ANESTHESIA:    General INTRAVENOUS FLUIDS: 900 ml ESTIMATED BLOOD LOSS: minimal URINE OUTPUT: 150 ml SPECIMENS: bilateral ovaries and tubes; pelvic washings COMPLICATIONS: None immediate  PROCEDURE IN DETAIL:  The patient had sequential compression devices applied to her lower extremities while in the preoperative area.  She was then taken to the operating room where general anesthesia was administered and was found to be adequate.  She was placed in the dorsal lithotomy position, and was prepped and draped in a sterile manner.    A straight Foley catheter was inserted into her bladder and an  estimated amount of clear urine was drained; a uterine manipulator was then advanced into the uterus .  After an adequate timeout was performed, attention was turned to the abdomen where an umbilical incision was made with the scalpel.  The Optiview 5-mm trocar and sleeve were then advanced without difficulty with the laparoscope under direct visualization into the abdomen.  The abdomen was then insufflated with carbon dioxide gas and adequate pneumoperitoneum was obtained.   A detailed survey of the patient's pelvis and abdomen revealed the findings as mentioned above.  Pelvic washings were collected.  An 1mm post was palced in the left lower quadrant and a 21mm post placed in the RLQ, both under direct visualization.  After visualization of the ureters bilaterally, the right and left ovaries and tubes were removed with a Ligasure cautery device and placed into endocatch bags. The large right ovary required fascial and skin extensions for removal, but no contents were spilled into the abdomen. The operative sites were surveyed under decreased pressure, and both required further cautery to become hemostatic. At the end of the case, these sites were watched carefully and no further bleeding noted. A survey was taken of the upper abdomen as well.  No intraoperative injury to surrounding organs was noted.  Pictures were taken of the quadrants and pelvis. The abdomen was desufflated and all instruments were then removed from the patient's abdomen. The large left fascial extension was closed with 0-Vicryl and the skin closed with 4-0 Monocryl. The uterine manipulator was removed without complications.  All incisions were closed with Dermabond.   The patient tolerated the procedures well.  All instruments, needles, and sponge counts were correct x 2. The patient was taken to the recovery room in stable condition. She received iv tylenol and toradol in PACU.

## 2015-12-20 NOTE — ED Notes (Signed)
GYN at bedside

## 2015-12-20 NOTE — Anesthesia Preprocedure Evaluation (Signed)
Anesthesia Evaluation  Patient identified by MRN, date of birth, ID band  Reviewed: Allergy & Precautions, NPO status , Patient's Chart, lab work & pertinent test results  Airway Mallampati: III       Dental  (+) Teeth Intact, Chipped   Pulmonary asthma , Current Smoker,     + decreased breath sounds      Cardiovascular Exercise Tolerance: Good hypertension, Pt. on medications  Rhythm:Regular Rate:Normal     Neuro/Psych  Headaches, CVA    GI/Hepatic Neg liver ROS, GERD  Medicated,  Endo/Other  negative endocrine ROSMorbid obesity  Renal/GU negative Renal ROS     Musculoskeletal   Abdominal (+) + obese,   Peds  Hematology   Anesthesia Other Findings   Reproductive/Obstetrics                             Anesthesia Physical Anesthesia Plan  ASA: III and emergent  Anesthesia Plan: General   Post-op Pain Management:    Induction: Intravenous  Airway Management Planned: Oral ETT  Additional Equipment:   Intra-op Plan:   Post-operative Plan: Extubation in OR  Informed Consent: I have reviewed the patients History and Physical, chart, labs and discussed the procedure including the risks, benefits and alternatives for the proposed anesthesia with the patient or authorized representative who has indicated his/her understanding and acceptance.     Plan Discussed with: CRNA  Anesthesia Plan Comments:         Anesthesia Quick Evaluation

## 2015-12-20 NOTE — Anesthesia Procedure Notes (Signed)
Procedure Name: Intubation Date/Time: 12/20/2015 5:18 PM Performed by: Dionne Bucy Pre-anesthesia Checklist: Patient identified, Patient being monitored, Timeout performed, Emergency Drugs available and Suction available Patient Re-evaluated:Patient Re-evaluated prior to inductionOxygen Delivery Method: Circle system utilized Preoxygenation: Pre-oxygenation with 100% oxygen Intubation Type: IV induction Ventilation: Mask ventilation without difficulty Laryngoscope Size: Mac and 3 Grade View: Grade II Tube type: Oral Tube size: 7.0 mm Number of attempts: 1 Airway Equipment and Method: Stylet Placement Confirmation: ETT inserted through vocal cords under direct vision,  positive ETCO2 and breath sounds checked- equal and bilateral Secured at: 21 cm Tube secured with: Tape Dental Injury: Teeth and Oropharynx as per pre-operative assessment

## 2015-12-20 NOTE — ED Notes (Addendum)
Pt uprite on stretcher in exam room with no distress noted; pt c/o left lower abd/side pain with no accomp symptoms; seen here for same 2 days ago and dx with large ovarian cyst; has not had f/u appointment yet and st tramadol is not helping her pain (took last ds at 3am); +BS, abd soft/nondist/nontender

## 2015-12-20 NOTE — Transfer of Care (Signed)
Immediate Anesthesia Transfer of Care Note  Patient: Jocelyn Simon  Procedure(s) Performed: Procedure(s): LAPAROSCOPIC BILATERAL SALPINGO OOPHORECTOMY WITH PELVIC WASHINGS (Bilateral)  Patient Location: PACU  Anesthesia Type:General  Level of Consciousness: awake, alert , oriented and patient cooperative  Airway & Oxygen Therapy: Patient Spontanous Breathing and Patient connected to face mask oxygen  Post-op Assessment: Report given to RN and Post -op Vital signs reviewed and stable  Post vital signs: Reviewed and stable  Last Vitals:  Filed Vitals:   12/20/15 2116 12/20/15 2222  BP: 114/76 117/90  Pulse: 83 82  Temp: 37.1 C 37 C  Resp: 20 18    Last Pain:  Filed Vitals:   12/20/15 2241  PainSc: 10-Worst pain ever         Complications: No apparent anesthesia complications

## 2015-12-20 NOTE — H&P (Signed)
Consult History and Physical   SERVICE: Gynecology   Patient Name: Jocelyn Simon Patient MRN:   [id number]  CC: Acute and persistent pelvic pain  HPI: MARJO BIETZ is a 50 y.o. [id number] with acute onset pelvic pain, started 4 weeks ago with small pain and has increased to 10/10 pain today. It is sharp, cramping, and radiates from her bilateral lower quadrants to her bilateral flank and back. She +nausea, no constipation, diarrhea, dysuria. Reports fever to 101 at home, afebrile here. Not controlled with tramadol and flexeril. IV dilaudid here helping some, but unable to walk. Movement hurts. THe pain was intermittently worsening while I was interviewing patient.   No hx of STDs, yeast infections recently. Visit to Er several days ago for same pain, now worse. No vaginal bleeding, discharge, vaginal pain.  NPO since midnight yesterday.  LMP >15 yrs ago.  No hx of surgery. 5 NSVD.    Review of Systems: positives in bold See HPI  Past Obstetrical History: OB History    Gravida Para Term Preterm AB TAB SAB Ectopic Multiple Living   6 5 5  0 1 1 0 0 0 5      Past Gynecologic History: No LMP recorded. Patient is postmenopausal.   Past Medical History: Past Medical History  Diagnosis Date  . Hypertension   . Stroke ([place])   . Headache   . Hyperlipidemia   . GERD (gastroesophageal reflux disease)   . Asthma     Past Surgical History:  No past surgical history on file.  Family History:  family history includes CAD in her mother and sister; CVA in her other.  Social History:  Social History   Social History  . Marital Status: Single    Spouse Name: N/A  . Number of Children: N/A  . Years of Education: N/A   Occupational History  . Not on file.   Social History Main Topics  . Smoking status: Current Every Day Smoker -- 0.25 packs/day    Types: Cigarettes  . Smokeless tobacco: Never Used  . Alcohol Use: No  . Drug Use: No  . Sexual Activity: Not on file    Other Topics Concern  . Not on file   Social History Narrative    Home Medications:  Medications reconciled in EPIC  No current facility-administered medications on file prior to encounter.   Current Outpatient Prescriptions on File Prior to Encounter  Medication Sig Dispense Refill  . acetaminophen (TYLENOL) 325 MG tablet Take 2 tablets (650 mg total) by mouth every 6 (six) hours as needed for moderate pain (headache).    Marland Kitchen aspirin EC 81 MG tablet Take 81 mg by mouth daily.    . clopidogrel (PLAVIX) 75 MG tablet Take 75 mg by mouth daily.    . cyclobenzaprine (FLEXERIL) 5 MG tablet Take 2 tablets (10 mg total) by mouth 3 (three) times daily as needed for muscle spasms. Do not drive while taking this medication. 15 tablet 0  . hydrochlorothiazide (HYDRODIURIL) 25 MG tablet Take 1 tablet (25 mg total) by mouth daily. 90 tablet 1  . nicotine (NICOTROL) 10 MG inhaler Inhale 1 continuous puffing into the lungs as needed for smoking cessation (May inhale contents of 6 to 12 cartridges per day, gradually decreasing over time.).    Marland Kitchen pantoprazole (PROTONIX) 40 MG tablet Take 1 tablet (40 mg total) by mouth daily. 90 tablet 1    Allergies:  No Known Allergies  Physical Exam:  Temp:  [98.3 F (  36.8 C)] 98.3 F (36.8 C) (06/28 0601) Pulse Rate:  [80-90] 80 (06/28 1100) Resp:  [18] 18 (06/28 0601) BP: (118-133)/(77-97) 118/77 mmHg (06/28 1100) SpO2:  [96 %-100 %] 100 % (06/28 1100) Weight:  [191 lb (86.637 kg)] 191 lb (86.637 kg) (06/28 0601)   General Appearance:  Well developed, well nourished, no acute distress, alert and oriented x3 HEENT:  Normocephalic atraumatic, extraocular movements intact, moist mucous membranes Cardiovascular:  Normal S1/S2, regular rate and rhythm, no murmurs Pulmonary:  clear to auscultation, no wheezes, rales or rhonchi, symmetric air entry, good air exchange Abdomen:  Bowel sounds present, +voluntary and involuntary guarding, nondistended, no abnormal  masses, +pain with palpation, +CVA tenderness that appears to be muscular in origin Extremities:  Full range of motion, no pedal edema, 2+ distal pulses, no tenderness Skin:  normal coloration and turgor, no rashes, no suspicious skin lesions noted  Neurologic:  Cranial nerves 2-12 grossly intact, normal muscle tone, strength 5/5 all four extremities Psychiatric:  Normal mood and affect, appropriate, no AH/VH Pelvic:  NEFG, no vulvar masses or lesions, normal vaginal mucosa, no vaginal bleeding or discharge, cervix without lesions or erythema, no CMT, tender uterus, no adnexal masses appreciated but limited by pain and guarding. + white discharge  Labs/Studies:   CBC and Coags:  Lab Results  Component Value Date   WBC 5.7 12/20/2015   NEUTOPHILPCT 50 12/20/2015   EOSPCT 5 12/20/2015   BASOPCT 1 12/20/2015   LYMPHOPCT 34 12/20/2015   HGB 11.4* 12/20/2015   HCT 32.9* 12/20/2015   MCV 83.6 12/20/2015   PLT 273 12/20/2015   INR 0.89 04/07/2015   CMP:  Lab Results  Component Value Date   NA 135 12/20/2015   K 3.7 12/20/2015   CL 102 12/20/2015   CO2 25 12/20/2015   BUN 19 12/20/2015   CREATININE 0.72 12/20/2015   CREATININE 0.72 12/17/2015   CREATININE 0.67 08/22/2015   GLU 84 03/07/2015   PROT 7.6 12/20/2015   BILITOT 0.3 12/20/2015   ALT 22 12/20/2015   AST 19 12/20/2015   ALKPHOS 63 12/20/2015     TVUS:   Other Imaging: Dg Chest 2 View  12/17/2015  CLINICAL DATA:  Lower chest pain EXAM: CHEST  2 VIEW COMPARISON:  11/29/2014 chest radiograph. FINDINGS: Stable cardiomediastinal silhouette with normal heart size. No pneumothorax. No pleural effusion. Lungs appear clear, with no acute consolidative airspace disease and no pulmonary edema. IMPRESSION: No active cardiopulmonary disease. Electronically Signed   By: Ilona Sorrel M.D.   On: 12/17/2015 12:24   US Transvaginal Non-ob  12/20/2015  CLINICAL DATA:  Worsening lower abdominal pain EXAM: TRANSABDOMINAL AND TRANSVAGINAL  ULTRASOUND OF PELVIS DOPPLER ULTRASOUND OF OVARIES TECHNIQUE: Both transabdominal and transvaginal ultrasound examinations of the pelvis were performed. Transabdominal technique was performed for global imaging of the pelvis including uterus, ovaries, adnexal regions, and pelvic cul-de-sac. It was necessary to proceed with endovaginal exam following the transabdominal exam to visualize the uterus, endometrium, ovaries, and adnexal structures. Color and duplex Doppler ultrasound was utilized to evaluate blood flow to the ovaries. COMPARISON:  Pelvic ultrasound of December 17, 2015 and abdominal and pelvic CT scan of December 17, 2015. FINDINGS: Uterus Measurements: 9.2 x 4.2 x 5.6 cm. The uterine echotexture is heterogeneous. There is no discrete fibroid or other mass. Endometrium Thickness: 3.5 mm.  No focal abnormality visualized. Right ovary Measurements: 4.1 x 3.7 by 7 cm. The majority of the ovary is occupied by septated cystic structure measuring 7.6  x 4.7 x 4.9 cm. This has decreased slightly in size since the previous study. No layering debris is observed within it. Left ovary Measurements: 2.2 x 2.3 x 3 cm. There are developing follicles. There is a dominant cystic lesion measuring 3.3 x 1.6 x 2 cm. This was felt to reflect a hydrosalpinx on the previous study and on some images similar findings are noted today. Pulsed Doppler evaluation of both ovaries demonstrates normal low-resistance arterial and venous waveforms. Other findings No abnormal free fluid. IMPRESSION: 1. Slight interval decrease in the size of the complex right ovarian cyst. No definite evidence of internal hemorrhage within the cyst. 2. 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 Left ovarian cyst versus adjacent cervical Gartner duct cyst described on the previous CT scan measuring up to 3.3 cm. 3. No free pelvic fluid. 4. No acute abnormality of the uterus or endometrium. Electronically Signed   By: David  Martinique M.D.   On: 12/20/2015  10:17   US Transvaginal Non-ob  12/17/2015  CLINICAL DATA:  Followup ovarian cyst visualize on CT scan 12/17/2015. Pelvic pain for 3 weeks. Worse today. EXAM: TRANSABDOMINAL AND TRANSVAGINAL ULTRASOUND OF PELVIS DOPPLER ULTRASOUND OF OVARIES TECHNIQUE: Both transabdominal and transvaginal ultrasound examinations of the pelvis were performed. Transabdominal technique was performed for global imaging of the pelvis including uterus, ovaries, adnexal regions, and pelvic cul-de-sac. It was necessary to proceed with endovaginal exam following the transabdominal exam to visualize the ovaries and endometrium. Color and duplex Doppler ultrasound was utilized to evaluate blood flow to the ovaries. COMPARISON:  12/17/2015 FINDINGS: Uterus Measurements: 9.9 x 4.0 x 5.1 cm. No fibroids or other mass visualized. Endometrium Thickness: 3.6 mm.  No focal abnormality visualized. Right ovary Measurements: 6.6 x 4.8 x 4.1 cm. Septated cyst measuring 6.1 x 4.4 x 3.9 cm consistent with the CT scan. No worrisome sonographic features. Left ovary Measurements: 3.7 x 2.9 x 2.1 cm. Tubular fluid-filled structure in the left adnexa most likely hydrosalpinx. Pulsed Doppler evaluation of both ovaries demonstrates normal low-resistance arterial and venous waveforms. Other findings No abnormal free fluid. IMPRESSION: 1. Normal uterus. 2. 6.1 x 4.4 x 3.9 cm septated cyst associated with the right ovary. Recommend four-month follow-up pelvic ultrasound. 3. 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 4. Patent ovarian blood flow bilaterally. Electronically Signed   By: Marijo Sanes M.D.   On: 12/17/2015 17:15   US Pelvis Complete  12/20/2015  CLINICAL DATA:  Worsening lower abdominal pain EXAM: TRANSABDOMINAL AND TRANSVAGINAL ULTRASOUND OF PELVIS DOPPLER ULTRASOUND OF OVARIES TECHNIQUE: Both transabdominal and transvaginal ultrasound examinations of the pelvis were performed. Transabdominal technique was performed for global imaging of the pelvis  including uterus, ovaries, adnexal regions, and pelvic cul-de-sac. It was necessary to proceed with endovaginal exam following the transabdominal exam to visualize the uterus, endometrium, ovaries, and adnexal structures. Color and duplex Doppler ultrasound was utilized to evaluate blood flow to the ovaries. COMPARISON:  Pelvic ultrasound of December 17, 2015 and abdominal and pelvic CT scan of December 17, 2015. FINDINGS: Uterus Measurements: 9.2 x 4.2 x 5.6 cm. The uterine echotexture is heterogeneous. There is no discrete fibroid or other mass. Endometrium Thickness: 3.5 mm.  No focal abnormality visualized. Right ovary Measurements: 4.1 x 3.7 by 7 cm. The majority of the ovary is occupied by septated cystic structure measuring 7.6 x 4.7 x 4.9 cm. This has decreased slightly in size since the previous study. No layering debris is observed within it. Left ovary Measurements: 2.2 x 2.3 x 3 cm. There  are developing follicles. There is a dominant cystic lesion measuring 3.3 x 1.6 x 2 cm. This was felt to reflect a hydrosalpinx on the previous study and on some images similar findings are noted today. Pulsed Doppler evaluation of both ovaries demonstrates normal low-resistance arterial and venous waveforms. Other findings No abnormal free fluid. IMPRESSION: 1. Slight interval decrease in the size of the complex right ovarian cyst. No definite evidence of internal hemorrhage within the cyst. 2. 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 Left ovarian cyst versus adjacent cervical Gartner duct cyst described on the previous CT scan measuring up to 3.3 cm. 3. No free pelvic fluid. 4. No acute abnormality of the uterus or endometrium. Electronically Signed   By: David  Martinique M.D.   On: 12/20/2015 10:17   US Pelvis Complete  12/17/2015  CLINICAL DATA:  Followup ovarian cyst visualize on CT scan 12/17/2015. Pelvic pain for 3 weeks. Worse today. EXAM: TRANSABDOMINAL AND TRANSVAGINAL ULTRASOUND OF PELVIS DOPPLER  ULTRASOUND OF OVARIES TECHNIQUE: Both transabdominal and transvaginal ultrasound examinations of the pelvis were performed. Transabdominal technique was performed for global imaging of the pelvis including uterus, ovaries, adnexal regions, and pelvic cul-de-sac. It was necessary to proceed with endovaginal exam following the transabdominal exam to visualize the ovaries and endometrium. Color and duplex Doppler ultrasound was utilized to evaluate blood flow to the ovaries. COMPARISON:  12/17/2015 FINDINGS: Uterus Measurements: 9.9 x 4.0 x 5.1 cm. No fibroids or other mass visualized. Endometrium Thickness: 3.6 mm.  No focal abnormality visualized. Right ovary Measurements: 6.6 x 4.8 x 4.1 cm. Septated cyst measuring 6.1 x 4.4 x 3.9 cm consistent with the CT scan. No worrisome sonographic features. Left ovary Measurements: 3.7 x 2.9 x 2.1 cm. Tubular fluid-filled structure in the left adnexa most likely hydrosalpinx. Pulsed Doppler evaluation of both ovaries demonstrates normal low-resistance arterial and venous waveforms. Other findings No abnormal free fluid. IMPRESSION: 1. Normal uterus. 2. 6.1 x 4.4 x 3.9 cm septated cyst associated with the right ovary. Recommend four-month follow-up pelvic ultrasound. 3. 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 4. Patent ovarian blood flow bilaterally. Electronically Signed   By: Marijo Sanes M.D.   On: 12/17/2015 17:15   Ct Abdomen Pelvis W Contrast  12/17/2015  CLINICAL DATA:  Bilateral flank pain and abdominal pain with nausea. EXAM: CT ABDOMEN AND PELVIS WITH CONTRAST TECHNIQUE: Multidetector CT imaging of the abdomen and pelvis was performed using the standard protocol following bolus administration of intravenous contrast. CONTRAST:  169mL ISOVUE-300 IOPAMIDOL (ISOVUE-300) INJECTION 61% COMPARISON:  None. FINDINGS: Lower chest:  Mild scarring at the left lung base. Hepatobiliary: Normal liver and gallbladder. No evidence of biliary ductal dilatation. Pancreas: No mass,  inflammatory changes, or other significant abnormality. Spleen: Within normal limits in size and appearance. Adrenals/Urinary Tract: The adrenal glands appear normal. There is evidence of mild right-sided hydronephrosis and hydroureter. This appears to be due to a right adnexal abnormality which is described below. No urinary tract calculi identified. There is no evidence of left-sided hydronephrosis. The bladder is unremarkable. Stomach/Bowel: No obstruction or inflammation. No evidence of appendicitis. No free air, free fluid or abscess identified. Vascular/Lymphatic: No enlarged lymph nodes are seen. The abdominal aorta is normal in caliber. Reproductive: Complex cystic abnormality superior to the uterus appears to emanate from the right adnexal region and measures approximately 6.3 x 4.9 x 5.0 cm. This may be a manifestation of hydrosalpinx or a complex cystic adnexal mass. Recommend further correlation with transabdominal and transvaginal pelvic sonography. Additional tubular cystic  abnormality of the left adnexal region also identified having the appearance likely of hydrosalpinx and measuring approximately 2 cm in thickness. Ultrasound would also be helpful to evaluate this finding. The uterus is mildly prominent in size but demonstrates no focal lesions by CT. 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 Other: No hernias identified. Musculoskeletal: Bony structures are unremarkable. There is mild disc space narrowing at L5-S1. No bony lesions or fractures identified. IMPRESSION: 1. Mild right hydronephrosis and hydroureter. This appears to be caused by a a right adnexal cystic abnormality. 2. Complex cystic abnormality of the right adnexal region measures just over 6 cm in greatest diameter. This is felt to likely be a manifestation of hydrosalpinx or a complex cystic adnexal mass. Recommend trans abdominal and  transvaginal pelvic sonography. 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 3. 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 Electronically Signed   By: Aletta Edouard M.D.   On: 12/17/2015 14:37   [place] Art/ven Flow Abd Pelv Doppler  12/20/2015  CLINICAL DATA:  Worsening lower abdominal pain EXAM: TRANSABDOMINAL AND TRANSVAGINAL ULTRASOUND OF PELVIS DOPPLER ULTRASOUND OF OVARIES TECHNIQUE: Both transabdominal and transvaginal ultrasound examinations of the pelvis were performed. Transabdominal technique was performed for global imaging of the pelvis including uterus, ovaries, adnexal regions, and pelvic cul-de-sac. It was necessary to proceed with endovaginal exam following the transabdominal exam to visualize the uterus, endometrium, ovaries, and adnexal structures. Color and duplex Doppler ultrasound was utilized to evaluate blood flow to the ovaries. COMPARISON:  Pelvic ultrasound of December 17, 2015 and abdominal and pelvic CT scan of December 17, 2015. FINDINGS: Uterus Measurements: 9.2 x 4.2 x 5.6 cm. The uterine echotexture is heterogeneous. There is no discrete fibroid or other mass. Endometrium Thickness: 3.5 mm.  No focal abnormality visualized. Right ovary Measurements: 4.1 x 3.7 by 7 cm. The majority of the ovary is occupied by septated cystic structure measuring 7.6 x 4.7 x 4.9 cm. This has decreased slightly in size since the previous study. No layering debris is observed within it. Left ovary Measurements: 2.2 x 2.3 x 3 cm. There are developing follicles. There is a dominant cystic lesion measuring 3.3 x 1.6 x 2 cm. This was felt to reflect a hydrosalpinx on the previous study and on some images similar findings are noted today. Pulsed Doppler evaluation of both ovaries demonstrates normal low-resistance arterial and venous waveforms. Other findings No abnormal free fluid. IMPRESSION: 1. Slight interval decrease in the size of the complex right ovarian cyst. No definite evidence of  internal hemorrhage within the cyst. 2. 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 Left ovarian cyst versus adjacent cervical Gartner duct cyst described on the previous CT scan measuring up to 3.3 cm. 3. No free pelvic fluid. 4. No acute abnormality of the uterus or endometrium. Electronically Signed   By: David  Martinique M.D.   On: 12/20/2015 10:17   [place] Art/ven Flow Abd Pelv Doppler  12/17/2015  CLINICAL DATA:  Followup ovarian cyst visualize on CT scan 12/17/2015. Pelvic pain for 3 weeks. Worse today. EXAM: TRANSABDOMINAL AND TRANSVAGINAL ULTRASOUND OF PELVIS DOPPLER ULTRASOUND OF OVARIES TECHNIQUE: Both transabdominal and transvaginal ultrasound examinations of the pelvis were performed. Transabdominal technique was performed for global imaging of the pelvis including uterus, ovaries, adnexal regions, and pelvic cul-de-sac. It was necessary to proceed with endovaginal exam following the transabdominal exam  to visualize the ovaries and endometrium. Color and duplex Doppler ultrasound was utilized to evaluate blood flow to the ovaries. COMPARISON:  12/17/2015 FINDINGS: Uterus Measurements: 9.9 x 4.0 x 5.1 cm. No fibroids or other mass visualized. Endometrium Thickness: 3.6 mm.  No focal abnormality visualized. Right ovary Measurements: 6.6 x 4.8 x 4.1 cm. Septated cyst measuring 6.1 x 4.4 x 3.9 cm consistent with the CT scan. No worrisome sonographic features. Left ovary Measurements: 3.7 x 2.9 x 2.1 cm. Tubular fluid-filled structure in the left adnexa most likely hydrosalpinx. Pulsed Doppler evaluation of both ovaries demonstrates normal low-resistance arterial and venous waveforms. Other findings No abnormal free fluid. IMPRESSION: 1. Normal uterus. 2. 6.1 x 4.4 x 3.9 cm septated cyst associated with the right ovary. Recommend four-month follow-up pelvic ultrasound. 3. 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 4. Patent ovarian blood flow bilaterally. Electronically Signed   By: Marijo Sanes M.D.   On:  12/17/2015 17:15     Assessment / Plan:   GREIDIS MOUNTFORD is a 50 y.o. [id number] who presents with acute pelvic pain, bilateral septated ovarian cysts   1. Because of her significant pelvic pain not controlled with iv pain meds, complex postmenopausal ovarian cysts and possible hydrosalpingx, I have offered her dx lap with BSO and pelvic washings. She did have an outpatient appointment for tomorrow, but her clinical presentation is significant enough that we will move ahead with surgery at this time.  Because of her normal WBC, no fever here and normal BUN/Cr, I think it unlikely that she has a kidney infection. However, I did discuss with her that after this acute phase has resolved, if she does have kidney or muscle pain, the dx lap will not resolve that issue.  I have added her on for next available. I will plan to keep her overnight for monitoring. Will see her as needed for postop care, or in 2 weeks for that visit.  Possible yeast infection. Plan for diflucan postop.  Thank you for the opportunity to be involved with this pt's care.

## 2015-12-20 NOTE — ED Notes (Signed)
Pt in with co generalized abd pain and left flank pain since last night, denies any n.v.d. Was here recently and dx with right ovarian cyst and told to follow up.

## 2015-12-20 NOTE — ED Provider Notes (Signed)
[facility] Emergency Department Provider Note        Time seen: ----------------------------------------- 6:53 AM on 12/20/2015 -----------------------------------------    I have reviewed the triage vital signs and the nursing notes.   HISTORY  Chief Complaint Abdominal Pain    HPI Jocelyn Simon is a 50 y.o. female who presents to ER for severe abdominal pain on the right side. Patient was recently seen 2 days ago here in the ER and diagnosed with a large right ovarian cyst. She has not had a follow-up appointment yet and states tramadol is not helping her pain. Her last dose was approximately 3 hours ago. She states she has had a fever, states her back pain is hurting as well. Family and patient state this is the same pain she's had before. Reportedly she has an OB/GYN appointment tomorrow.   Past Medical History  Diagnosis Date  . Hypertension   . Stroke ([place])   . Headache   . Hyperlipidemia   . GERD (gastroesophageal reflux disease)   . Asthma     Patient Active Problem List   Diagnosis Date Noted  . Prediabetes 09/19/2015  . Left-sided weakness 04/07/2015    No past surgical history on file.  Allergies Review of patient's allergies indicates no known allergies.  Social History Social History  Substance Use Topics  . Smoking status: Current Every Day Smoker -- 0.25 packs/day    Types: Cigarettes  . Smokeless tobacco: Never Used  . Alcohol Use: No    Review of Systems Constitutional: Negative for fever. Cardiovascular: Negative for chest pain. Respiratory: Negative for shortness of breath. Gastrointestinal: Positive for abdominal pain Genitourinary: Negative for dysuria. Musculoskeletal: Positive for back pain Skin: Negative for rash. Neurological: Negative for headaches, focal weakness or numbness.  10-point ROS otherwise negative.  ____________________________________________   PHYSICAL EXAM:  VITAL SIGNS: ED  Triage Vitals  Enc Vitals Group     BP 12/20/15 0601 133/88 mmHg     Pulse Rate 12/20/15 0601 90     Resp 12/20/15 0601 18     Temp 12/20/15 0601 98.3 F (36.8 C)     Temp Source 12/20/15 0601 Oral     SpO2 12/20/15 0601 100 %     Weight 12/20/15 0601 191 lb (86.637 kg)     Height 12/20/15 0601 5\' 1"  (1.549 m)     Head Cir --      Peak Flow --      Pain Score 12/20/15 0601 10     Pain Loc --      Pain Edu? --      Excl. in [place]? --    Constitutional: Alert and oriented. Mild distress Eyes: Conjunctivae are normal. PERRL. Normal extraocular movements. ENT   Head: Normocephalic and atraumatic.   Nose: No congestion/rhinnorhea.   Mouth/Throat: Mucous membranes are moist.   Neck: No stridor. Cardiovascular: Normal rate, regular rhythm. No murmurs, rubs, or gallops. Respiratory: Normal respiratory effort without tachypnea nor retractions. Breath sounds are clear and equal bilaterally. No wheezes/rales/rhonchi. Gastrointestinal: Right lower quadrant tenderness, normal bowel sounds Musculoskeletal: Nontender with normal range of motion in all extremities. No lower extremity tenderness nor edema. Neurologic:  Normal speech and language. No gross focal neurologic deficits are appreciated.  Skin:  Skin is warm, dry and intact. No rash noted. Psychiatric: Mood and affect are normal. Speech and behavior are normal.  ____________________________________________  ED COURSE:  Pertinent labs & imaging results that were available during my care of the  patient were reviewed by me and considered in my medical decision making (see chart for details). Patient presents with right lower quadrant pain consistent with her prior ovarian cyst. We will provide pain relief IV narcotics, we'll discuss with OB/GYN. ____________________________________________    LABS (pertinent positives/negatives)  Labs Reviewed  CBC WITH DIFFERENTIAL/PLATELET - Abnormal; Notable for the following:     Hemoglobin 11.4 (*)    HCT 32.9 (*)    RDW 14.9 (*)    All other components within normal limits  COMPREHENSIVE METABOLIC PANEL - Abnormal; Notable for the following:    Glucose, Bld 113 (*)    All other components within normal limits  URINALYSIS COMPLETEWITH MICROSCOPIC ([facility] ONLY)  POC URINE PREG, ED    RADIOLOGY Images were viewed by me  IMPRESSION: 1. Slight interval decrease in the size of the complex right ovarian cyst. No definite evidence of internal hemorrhage within the cyst. 2. 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 Left ovarian cyst versus adjacent cervical Gartner duct cyst described on the previous CT scan measuring up to 3.3 cm. 3. No free pelvic fluid. 4. No acute abnormality of the uterus or endometrium.   ____________________________________________  FINAL ASSESSMENT AND PLAN  Ovarian cyst  Plan: Patient with labs and imaging as dictated above. Patient essentially with intractable pain here. I will discuss laparoscopic salpingo-oophorectomy with Dr. Leafy Ro. Currently pain is improved some with Dilaudid but not completely.   Earleen Newport, MD   Note: This dictation was prepared with Dragon dictation. Any transcriptional errors that result from this process are unintentional   Earleen Newport, MD 12/20/15 1023

## 2015-12-20 NOTE — ED Notes (Signed)
OR RN Rose called, sending OR tech for pt. At 1615, pt. Informed.

## 2015-12-21 ENCOUNTER — Encounter: Payer: Self-pay | Admitting: Obstetrics and Gynecology

## 2015-12-21 LAB — CBC
HEMATOCRIT: 31.7 % — AB (ref 35.0–47.0)
HEMOGLOBIN: 10.7 g/dL — AB (ref 12.0–16.0)
MCH: 28.2 pg (ref 26.0–34.0)
MCHC: 33.7 g/dL (ref 32.0–36.0)
MCV: 83.8 fL (ref 80.0–100.0)
PLATELETS: 266 10*3/uL (ref 150–440)
RBC: 3.78 MIL/uL — AB (ref 3.80–5.20)
RDW: 14.8 % — ABNORMAL HIGH (ref 11.5–14.5)
WBC: 8.9 10*3/uL (ref 3.6–11.0)

## 2015-12-21 LAB — CBC WITH DIFFERENTIAL/PLATELET
BASOS ABS: 0.1 10*3/uL (ref 0–0.1)
BASOS PCT: 1 %
EOS ABS: 0 10*3/uL (ref 0–0.7)
Eosinophils Relative: 0 %
HEMATOCRIT: 32.2 % — AB (ref 35.0–47.0)
Hemoglobin: 10.7 g/dL — ABNORMAL LOW (ref 12.0–16.0)
LYMPHS PCT: 22 %
Lymphs Abs: 2.1 10*3/uL (ref 1.0–3.6)
MCH: 27.8 pg (ref 26.0–34.0)
MCHC: 33.2 g/dL (ref 32.0–36.0)
MCV: 83.6 fL (ref 80.0–100.0)
MONO ABS: 1 10*3/uL — AB (ref 0.2–0.9)
Monocytes Relative: 10 %
NEUTROS ABS: 6.6 10*3/uL — AB (ref 1.4–6.5)
Neutrophils Relative %: 67 %
PLATELETS: 255 10*3/uL (ref 150–440)
RBC: 3.85 MIL/uL (ref 3.80–5.20)
RDW: 14.6 % — AB (ref 11.5–14.5)
WBC: 9.8 10*3/uL (ref 3.6–11.0)

## 2015-12-21 LAB — COMPREHENSIVE METABOLIC PANEL
ALBUMIN: 3.8 g/dL (ref 3.5–5.0)
ALT: 21 U/L (ref 14–54)
ANION GAP: 8 (ref 5–15)
AST: 25 U/L (ref 15–41)
Alkaline Phosphatase: 49 U/L (ref 38–126)
BILIRUBIN TOTAL: 0.3 mg/dL (ref 0.3–1.2)
BUN: 19 mg/dL (ref 6–20)
CHLORIDE: 102 mmol/L (ref 101–111)
CO2: 27 mmol/L (ref 22–32)
Calcium: 8.8 mg/dL — ABNORMAL LOW (ref 8.9–10.3)
Creatinine, Ser: 0.95 mg/dL (ref 0.44–1.00)
GFR calc Af Amer: >60 mL/min (ref >60)
GFR calc non Af Amer: >60 mL/min (ref >60)
GLUCOSE: 99 mg/dL (ref 65–99)
POTASSIUM: 3.4 mmol/L — AB (ref 3.5–5.1)
SODIUM: 137 mmol/L (ref 135–145)
Total Protein: 7 g/dL (ref 6.5–8.1)

## 2015-12-21 LAB — BASIC METABOLIC PANEL
ANION GAP: 6 (ref 5–15)
BUN: 18 mg/dL (ref 6–20)
CO2: 26 mmol/L (ref 22–32)
Calcium: 8.6 mg/dL — ABNORMAL LOW (ref 8.9–10.3)
Chloride: 104 mmol/L (ref 101–111)
Creatinine, Ser: 0.78 mg/dL (ref 0.44–1.00)
GFR calc Af Amer: >60 mL/min (ref >60)
GLUCOSE: 125 mg/dL — AB (ref 65–99)
POTASSIUM: 4.5 mmol/L (ref 3.5–5.1)
Sodium: 136 mmol/L (ref 135–145)

## 2015-12-21 MED ORDER — FLUCONAZOLE 50 MG PO TABS
150.0000 mg | ORAL_TABLET | Freq: Once | ORAL | Status: AC
  Administered 2015-12-21: 150 mg via ORAL
  Filled 2015-12-21 (×2): qty 1

## 2015-12-21 MED ORDER — POLYETHYLENE GLYCOL 3350 17 G PO PACK
17.0000 g | PACK | Freq: Every day | ORAL | Status: DC
  Administered 2015-12-21 – 2015-12-22 (×2): 17 g via ORAL
  Filled 2015-12-21 (×2): qty 1

## 2015-12-21 MED ORDER — GABAPENTIN 300 MG PO CAPS
600.0000 mg | ORAL_CAPSULE | Freq: Every day | ORAL | Status: DC
  Administered 2015-12-21 (×2): 600 mg via ORAL
  Filled 2015-12-21 (×3): qty 2

## 2015-12-21 MED ORDER — GABAPENTIN 300 MG PO CAPS
300.0000 mg | ORAL_CAPSULE | Freq: Every morning | ORAL | Status: DC
  Administered 2015-12-21 – 2015-12-22 (×2): 300 mg via ORAL
  Filled 2015-12-21: qty 1

## 2015-12-21 MED ORDER — HYDROMORPHONE HCL 1 MG/ML IJ SOLN
1.0000 mg | INTRAMUSCULAR | Status: DC | PRN
  Administered 2015-12-21: 1 mg via INTRAVENOUS
  Filled 2015-12-21: qty 1

## 2015-12-21 MED ORDER — NICOTINE 14 MG/24HR TD PT24
14.0000 mg | MEDICATED_PATCH | Freq: Every day | TRANSDERMAL | Status: DC
  Administered 2015-12-21 – 2015-12-22 (×2): 14 mg via TRANSDERMAL
  Filled 2015-12-21 (×2): qty 1

## 2015-12-21 MED ORDER — SCOPOLAMINE 1 MG/3DAYS TD PT72
1.0000 | MEDICATED_PATCH | TRANSDERMAL | Status: DC
  Administered 2015-12-21: 1.5 mg via TRANSDERMAL
  Filled 2015-12-21: qty 1

## 2015-12-21 MED ORDER — HYDROMORPHONE HCL 1 MG/ML IJ SOLN
0.4000 mg | INTRAMUSCULAR | Status: DC | PRN
  Administered 2015-12-21 (×3): 0.6 mg via INTRAVENOUS
  Filled 2015-12-21 (×3): qty 1

## 2015-12-21 MED ORDER — DIPHENHYDRAMINE HCL 50 MG/ML IJ SOLN
25.0000 mg | Freq: Four times a day (QID) | INTRAMUSCULAR | Status: DC | PRN
  Administered 2015-12-21 – 2015-12-22 (×5): 25 mg via INTRAVENOUS
  Filled 2015-12-21 (×5): qty 1

## 2015-12-21 MED ORDER — KETOROLAC TROMETHAMINE 30 MG/ML IJ SOLN
30.0000 mg | Freq: Three times a day (TID) | INTRAMUSCULAR | Status: DC
  Administered 2015-12-21 (×2): 30 mg via INTRAVENOUS
  Filled 2015-12-21 (×2): qty 1

## 2015-12-21 NOTE — Progress Notes (Addendum)
1 Day Post-Op Procedure(s) (LRB): LAPAROSCOPIC BILATERAL SALPINGO OOPHORECTOMY WITH PELVIC WASHINGS (Bilateral)  Subjective: Patient reports continued significant pain, but mildly improved from ER when I saw her yesterday. No BM, +voiding, no nausea with scopolamine patch and eating regular diet. Pain is now more localized to suprapubic, with some flank pain and midline back pain - these are unchanged from prior to surgery. She also has a patch of numbness on left lateral and anterior thigh, present for many weeks. Also has a left sided headache sometimes, increasing over last month but not present now  Also having vaginal itching.  Objective: I have reviewed patient's vital signs, intake and output, medications, labs and radiology results.  General: alert, cooperative, fatigued, moderate distress and morbidly obese Resp: clear to auscultation bilaterally Cardio: regular rate and rhythm, S1, S2 normal, no murmur, click, rub or gallop GI: normal findings: bowel sounds normal, no bruits heard and no masses palpable and abnormal findings:  guarding and obese Extremities: extremities normal, atraumatic, no cyanosis or edema  Vaginal: white clumpy discharge  Assessment: S/p POD#1  Procedure(s): LAPAROSCOPIC BILATERAL SALPINGO OOPHORECTOMY WITH PELVIC WASHINGS (Bilateral): stable, tolerating diet and voiding.   Plan: stable, tolerating diet and no postop fever, no ilieus,   Pain seems out of proportion to exam, which is same as prior to surgery. Improved in upper quadrants. She does not appear to have post surgetical complications causing her pain, and her vitals and labs are all normal. Her exam was unremarkable except for reported pain. Core muscle use associated with pain.   Currently am going with neuro-upregulation to explain pain in setting of fully normal exam and labs. Will continue to control with po meds and increase ambulation. If any focal abnormalities arise, will pursue. Will repeat  labs in am. Consider CT if unresolved but I think this will be low yield as prior CT without abnormality except in ovaries, and will likely see fluid and air from surgery. Anticipate BM.   Vaginitis: Diflucan based on discharge and exam and sx.  Likely d/c home tomorrow.  Encourage ambulation and stool softner. Will titrate pain meds to po only. Regular diet SCDs while not ambulatory   Toula Miyasaki 12/21/2015, 1:24 PM

## 2015-12-22 LAB — SURGICAL PATHOLOGY

## 2015-12-22 LAB — CYTOLOGY - NON PAP

## 2015-12-22 MED ORDER — ACETAMINOPHEN 500 MG PO TABS: 500.0000 mg | ORAL_TABLET | Freq: Four times a day (QID) | ORAL | Status: DC | PRN

## 2015-12-22 MED ORDER — PANTOPRAZOLE SODIUM 40 MG PO TBEC: 40.0000 mg | DELAYED_RELEASE_TABLET | Freq: Every day | ORAL | Status: DC

## 2015-12-22 MED ORDER — CYCLOBENZAPRINE HCL 10 MG PO TABS: 10.0000 mg | ORAL_TABLET | Freq: Three times a day (TID) | ORAL | Status: DC | PRN

## 2015-12-22 MED ORDER — HYDROCHLOROTHIAZIDE 25 MG PO TABS: 25.0000 mg | ORAL_TABLET | Freq: Every day | ORAL | Status: DC

## 2015-12-22 MED ORDER — NICOTINE 14 MG/24HR TD PT24: 14.0000 mg | MEDICATED_PATCH | Freq: Every day | TRANSDERMAL | Status: DC

## 2015-12-22 MED ORDER — CLOPIDOGREL BISULFATE 75 MG PO TABS: 75.0000 mg | ORAL_TABLET | Freq: Every day | ORAL | Status: DC

## 2015-12-22 MED ORDER — OXYCODONE-ACETAMINOPHEN 5-325 MG PO TABS: 1.0000 | ORAL_TABLET | ORAL | Status: DC | PRN

## 2015-12-22 MED ORDER — ALBUTEROL SULFATE (2.5 MG/3ML) 0.083% IN NEBU: 2.5000 mg | INHALATION_SOLUTION | Freq: Four times a day (QID) | RESPIRATORY_TRACT | Status: DC | PRN

## 2015-12-22 MED ORDER — IBUPROFEN 800 MG PO TABS: 800.0000 mg | ORAL_TABLET | Freq: Three times a day (TID) | ORAL | Status: DC | PRN

## 2015-12-22 NOTE — Progress Notes (Signed)
SATURATION QUALIFICATIONS: (This note is used to comply with regulatory documentation for home oxygen)  Patient Saturations on Room Air at Rest = 100%  Patient Saturations on Room Air while Ambulating = 95%  

## 2015-12-22 NOTE — Discharge Summary (Signed)
Physician Discharge Summary  Patient ID: ELODY GERHOLD MRN: [id number] DOB/AGE: 1965-06-30 50 y.o.  Admit date: 12/20/2015 Discharge date: 12/22/2015  Admission Diagnoses:  Discharge Diagnoses:  Active Problems:   Pelvic pain in female   Discharged Condition: fair  Hospital Course: admitted for pain control after dx lap and BSO- continued to have pain on POD#1- by pod#2 she was ambulating, voiding, tolerating po and had pain mostly controlled by po meds.  Since she is uninsured and without a primary care doctor, I have asked her to go to the health department or return to the PCP who started her on meds. Her cholesterol is high and her blood sugar is high; these are outpatient management chronic problems. I have refilled her home meds; she will f/u with pCP in 2 weeks.  I am not sure where her pain comes from, but I will f/u with pathology. The pain does not appear to be GYN in origin and does not appear to be surgical complications - all normal labs and imaging.  Dropped O2 sats while sleeping but did not qualify for O2 by hospital protocol. I have given her a script for nebulizer.  Consults: care management; primary care  Significant Diagnostic Studies: labs: normal  Treatments: IV hydration and surgery:   Discharge Exam: Blood pressure 110/88, pulse 67, temperature 98 F (36.7 C), temperature source Oral, resp. rate 15, height 5\' 1"  (1.549 m), weight 191 lb (86.637 kg), SpO2 99 %. General appearance: alert, cooperative and appears stated age Resp: clear to auscultation bilaterally Cardio: regular rate and rhythm, S1, S2 normal, no murmur, click, rub or gallop GI: soft, non-tender; bowel sounds normal; no masses,  no organomegaly Extremities: extremities normal, atraumatic, no cyanosis or edema Pulses: 2+ and symmetric Skin: Skin color, texture, turgor normal. No rashes or lesions  Disposition: 01-Home or Self Care      Discharge Instructions    DME Nebulizer  machine    Complete by:  As directed             Medication List    TAKE these medications        acetaminophen 325 MG tablet  Commonly known as:  TYLENOL  Take 2 tablets (650 mg total) by mouth every 6 (six) hours as needed for moderate pain (headache).     acetaminophen 500 MG tablet  Commonly known as:  TYLENOL  Take 1 tablet (500 mg total) by mouth every 6 (six) hours as needed.     albuterol 108 (90 Base) MCG/ACT inhaler  Commonly known as:  PROVENTIL HFA;VENTOLIN HFA  Inhale 2 puffs into the lungs every 6 (six) hours as needed for wheezing or shortness of breath.     albuterol (2.5 MG/3ML) 0.083% nebulizer solution  Commonly known as:  PROVENTIL  Take 3 mLs (2.5 mg total) by nebulization every 6 (six) hours as needed for wheezing or shortness of breath.     aspirin EC 81 MG tablet  Take 81 mg by mouth daily.     clopidogrel 75 MG tablet  Commonly known as:  PLAVIX  Take 75 mg by mouth daily.     clopidogrel 75 MG tablet  Commonly known as:  PLAVIX  Take 1 tablet (75 mg total) by mouth daily.     cyclobenzaprine 5 MG tablet  Commonly known as:  FLEXERIL  Take 2 tablets (10 mg total) by mouth 3 (three) times daily as needed for muscle spasms. Do not drive while taking this medication.  cyclobenzaprine 10 MG tablet  Commonly known as:  FLEXERIL  Take 1 tablet (10 mg total) by mouth 3 (three) times daily as needed for muscle spasms.     hydrochlorothiazide 25 MG tablet  Commonly known as:  HYDRODIURIL  Take 1 tablet (25 mg total) by mouth daily.     hydrochlorothiazide 25 MG tablet  Commonly known as:  HYDRODIURIL  Take 1 tablet (25 mg total) by mouth daily.     ibuprofen 800 MG tablet  Commonly known as:  ADVIL,MOTRIN  Take 1 tablet (800 mg total) by mouth every 8 (eight) hours as needed for moderate pain.     nicotine 10 MG inhaler  Commonly known as:  NICOTROL  Inhale 1 continuous puffing into the lungs as needed for smoking cessation (May inhale  contents of 6 to 12 cartridges per day, gradually decreasing over time.).     nicotine 14 mg/24hr patch  Commonly known as:  NICODERM CQ - dosed in mg/24 hours  Place 1 patch (14 mg total) onto the skin daily.     oxyCODONE-acetaminophen 5-325 MG tablet  Commonly known as:  PERCOCET/ROXICET  Take 1 tablet by mouth every 4 (four) hours as needed for severe pain.     pantoprazole 40 MG tablet  Commonly known as:  PROTONIX  Take 1 tablet (40 mg total) by mouth daily.     pantoprazole 40 MG tablet  Commonly known as:  PROTONIX  Take 1 tablet (40 mg total) by mouth daily.       Follow-up Information    Follow up with Benjaman Kindler, MD In 2 weeks.   Specialty:  Obstetrics and Gynecology   Why:  For postop check   Contact information:   [address] [address] [phone]       Signed: Benjaman Kindler 12/22/2015, 1:49 PM

## 2015-12-22 NOTE — Progress Notes (Signed)
D/C order from MD.  Reviewed d/c instructions and prescriptions with patient and answered any questions.  Patient d/c home via wheelchair by nursing/auxillary. 

## 2015-12-22 NOTE — Anesthesia Postprocedure Evaluation (Signed)
Anesthesia Post Note  Patient: Jocelyn Simon  Procedure(s) Performed: Procedure(s) (LRB): LAPAROSCOPIC BILATERAL SALPINGO OOPHORECTOMY WITH PELVIC WASHINGS (Bilateral)  Patient location during evaluation: PACU Anesthesia Type: General Level of consciousness: awake Pain management: satisfactory to patient Vital Signs Assessment: post-procedure vital signs reviewed and stable Respiratory status: spontaneous breathing Cardiovascular status: blood pressure returned to baseline Anesthetic complications: no    Last Vitals:  Filed Vitals:   12/22/15 0643 12/22/15 0646  BP:    Pulse: 75 69  Temp:    Resp: 11 10    Last Pain:  Filed Vitals:   12/22/15 0646  PainSc: 7                  VAN STAVEREN,Natasha Paulson

## 2015-12-22 NOTE — Discharge Instructions (Signed)
At your request, I have reordered your home medications so you can get them filled. I did not see one for high cholesterol, but the others are reasonable to take. I have also given you pain medicine and a muscle relaxent that you already had at home. Be careful taking them together; don't drive until you know how they affect you.   Diagnostic Laparoscopy  A diagnostic laparoscopy is a procedure to diagnose diseases in the abdomen. During the procedure, a thin, lighted, pencil-sized instrument called a laparoscope is inserted into the abdomen through an incision. The laparoscope allows your health care provider to look at the organs inside your body. LET [facility] CARE PROVIDER KNOW ABOUT:  Any allergies you have.  All medicines you are taking, including vitamins, herbs, eye drops, creams, and over-the-counter medicines.  Previous problems you or members of your family have had with the use of anesthetics.  Any blood disorders you have.  Previous surgeries you have had.  Medical conditions you have. RISKS AND COMPLICATIONS  Generally, this is a safe procedure. However, problems can occur, which may include:  Infection.  Bleeding.  Damage to other organs.  Allergic reaction to the anesthetics used during the procedure. BEFORE THE PROCEDURE  Do not eat or drink anything after midnight on the night before the procedure or as directed by your health care provider.  Ask your health care provider about:  Changing or stopping your regular medicines.  Taking medicines such as aspirin and ibuprofen. These medicines can thin your blood. Do not take these medicines before your procedure if your health care provider instructs you not to.  Plan to have someone take you home after the procedure. PROCEDURE  You may be given a medicine to help you relax (sedative).  You will be given a medicine to make you sleep (general anesthetic).  Your abdomen will be inflated with a gas. This will  make your organs easier to see.  Small incisions will be made in your abdomen.  A laparoscope and other small instruments will be inserted into the abdomen through the incisions.  A tissue sample may be removed from an organ in the abdomen for examination.  The instruments will be removed from the abdomen.  The gas will be released.  The incisions will be closed with stitches (sutures). AFTER THE PROCEDURE  Your blood pressure, heart rate, breathing rate, and blood oxygen level will be monitored often until the medicines you were given have worn off.   This information is not intended to replace advice given to you by your health care provider. Make sure you discuss any questions you have with your health care provider.

## 2015-12-22 NOTE — Care Management (Signed)
Patient admitted s/p Diagnostic laparoscopy, BSO, pelvic washings.  Patient is self pay patient.  Patient [facility].  Patient obtains her medications at [facility]  I have provided the patient another application for the [facility] should she need it.  There was question if the patient would require acute O2 at discharge.  Patient did not have qualifying oxygen saturations. Patient does request nebulizer.  MD has wrote script for nebulizer.  RN to provide patient with a copy of the prescription at time of discharge. I have contacted [facility].  Out of pocket cost $47.50.  Patient states that she will be able to afford this at the time of discharge and will obtains it at [place].  RNCM signing Off.

## 2016-01-30 ENCOUNTER — Other Ambulatory Visit: Payer: Self-pay | Admitting: Urology

## 2016-02-03 NOTE — Telephone Encounter (Signed)
Needs office visit prior to next refill.

## 2016-02-15 ENCOUNTER — Ambulatory Visit: Payer: Self-pay | Admitting: Family Medicine

## 2016-02-15 VITALS — BP 111/74 | HR 85 | Wt 188.0 lb

## 2016-02-15 DIAGNOSIS — R7303 Prediabetes: Secondary | ICD-10-CM

## 2016-02-15 DIAGNOSIS — I10 Essential (primary) hypertension: Secondary | ICD-10-CM

## 2016-02-15 DIAGNOSIS — E785 Hyperlipidemia, unspecified: Secondary | ICD-10-CM

## 2016-02-15 LAB — GLUCOSE, POCT (MANUAL RESULT ENTRY): POC GLUCOSE: 99 mg/dL (ref 70–99)

## 2016-02-15 MED ORDER — CLOPIDOGREL BISULFATE 75 MG PO TABS
75.0000 mg | ORAL_TABLET | Freq: Every day | ORAL | 3 refills | Status: DC
Start: 2016-02-15 — End: 2016-04-01

## 2016-02-15 MED ORDER — PANTOPRAZOLE SODIUM 40 MG PO TBEC: 40.0000 mg | DELAYED_RELEASE_TABLET | Freq: Every day | ORAL | 1 refills | Status: DC

## 2016-02-15 MED ORDER — ACETAMINOPHEN 325 MG PO TABS: 650.0000 mg | ORAL_TABLET | Freq: Four times a day (QID) | ORAL | Status: DC | PRN

## 2016-02-15 MED ORDER — ALBUTEROL SULFATE HFA 108 (90 BASE) MCG/ACT IN AERS: INHALATION_SPRAY | RESPIRATORY_TRACT | 0 refills | Status: DC

## 2016-02-15 MED ORDER — ASPIRIN EC 81 MG PO TBEC: 81.0000 mg | DELAYED_RELEASE_TABLET | Freq: Every day | ORAL | 3 refills | Status: AC

## 2016-02-15 MED ORDER — NICOTINE 10 MG IN INHA: 1.0000 | RESPIRATORY_TRACT | 1 refills | Status: DC | PRN

## 2016-02-15 MED ORDER — ATORVASTATIN CALCIUM 20 MG PO TABS: 20.0000 mg | ORAL_TABLET | Freq: Every day | ORAL | 1 refills | Status: DC

## 2016-02-15 MED ORDER — HYDROCHLOROTHIAZIDE 25 MG PO TABS: 25.0000 mg | ORAL_TABLET | Freq: Every day | ORAL | 3 refills | Status: DC

## 2016-02-15 MED ORDER — CYCLOBENZAPRINE HCL 10 MG PO TABS: 10.0000 mg | ORAL_TABLET | Freq: Three times a day (TID) | ORAL | 2 refills | Status: DC | PRN

## 2016-02-15 NOTE — Patient Instructions (Signed)
Stay on current medications. Work on more exercise and losing weight.

## 2016-02-15 NOTE — Progress Notes (Signed)
Subjective:     Patient ID: Jocelyn Simon, female   DOB: 06-21-1966, 50 y.o.   MRN: [id number]  HPI pt returns for recheck. HTN. Well controlled usually on HCTZ qd. No CP or SOB,  Is tired all the time. Had ovariectomies 2 months ago. Hyperlipid. Is on lipitor 10 mg daily. Liver normal. No myalgias.  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  A1c was 6.1 last time. No BS checks.  Review of Systems     Objective:   Physical Exam    A+O Conj clear RRR CTA NT/ND +2 pulses, no edema  Assessment:         Plan:     HTN. Stay on current meds. Check CBC and Met B.  Dm. Check A1c. Work on weight loss and more exercise.  High lipids. Check lipids and liver.  Increase Lipitor to 20 mg qd.  H/o CVA.  Stay on ASA and Plavix qd.  Fatigue. Check TSH and CBC  RTC 3 months

## 2016-02-20 ENCOUNTER — Other Ambulatory Visit: Payer: Self-pay

## 2016-02-20 DIAGNOSIS — I10 Essential (primary) hypertension: Secondary | ICD-10-CM

## 2016-02-20 DIAGNOSIS — E782 Mixed hyperlipidemia: Secondary | ICD-10-CM

## 2016-02-21 LAB — COMPREHENSIVE METABOLIC PANEL
A/G RATIO: 1.5 (ref 1.2–2.2)
ALK PHOS: 71 IU/L (ref 39–117)
ALT: 16 IU/L (ref 0–32)
AST: 12 IU/L (ref 0–40)
Albumin: 4.5 g/dL (ref 3.5–5.5)
BILIRUBIN TOTAL: 0.2 mg/dL (ref 0.0–1.2)
BUN/Creatinine Ratio: 23 (ref 9–23)
BUN: 16 mg/dL (ref 6–24)
CO2: 28 mmol/L (ref 18–29)
Calcium: 10.1 mg/dL (ref 8.7–10.2)
Chloride: 98 mmol/L (ref 96–106)
Creatinine, Ser: 0.69 mg/dL (ref 0.57–1.00)
GFR calc Af Amer: 117 mL/min/{1.73_m2} (ref >59)
GFR calc non Af Amer: 102 mL/min/{1.73_m2} (ref >59)
GLOBULIN, TOTAL: 3 g/dL (ref 1.5–4.5)
Glucose: 103 mg/dL — ABNORMAL HIGH (ref 65–99)
POTASSIUM: 3.8 mmol/L (ref 3.5–5.2)
SODIUM: 143 mmol/L (ref 134–144)
Total Protein: 7.5 g/dL (ref 6.0–8.5)

## 2016-02-21 LAB — LIPID PANEL
CHOLESTEROL TOTAL: 192 mg/dL (ref 100–199)
Chol/HDL Ratio: 4.5 ratio units — ABNORMAL HIGH (ref 0.0–4.4)
HDL: 43 mg/dL (ref >39)
LDL CALC: 122 mg/dL — AB (ref 0–99)
TRIGLYCERIDES: 134 mg/dL (ref 0–149)
VLDL CHOLESTEROL CAL: 27 mg/dL (ref 5–40)

## 2016-03-06 ENCOUNTER — Ambulatory Visit: Payer: Self-pay | Attending: Oncology

## 2016-03-31 ENCOUNTER — Encounter: Payer: Self-pay | Admitting: Emergency Medicine

## 2016-03-31 ENCOUNTER — Observation Stay
Admit: 2016-03-31 | Discharge: 2016-03-31 | Disposition: A | Discharge status: Home / Self Care | Payer: Medicaid Other | Attending: Internal Medicine | Admitting: Internal Medicine

## 2016-03-31 ENCOUNTER — Emergency Department: Payer: Medicaid Other

## 2016-03-31 ENCOUNTER — Observation Stay
Admission: EM | Admit: 2016-03-31 | Discharge: 2016-04-01 | Disposition: A | Discharge status: Home Health | Payer: Medicaid Other | Attending: Specialist | Admitting: Specialist

## 2016-03-31 ENCOUNTER — Observation Stay: Payer: Medicaid Other

## 2016-03-31 DIAGNOSIS — R531 Weakness: Secondary | ICD-10-CM | POA: Diagnosis not present

## 2016-03-31 DIAGNOSIS — I639 Cerebral infarction, unspecified: Secondary | ICD-10-CM

## 2016-03-31 DIAGNOSIS — Z8673 Personal history of transient ischemic attack (TIA), and cerebral infarction without residual deficits: Secondary | ICD-10-CM | POA: Diagnosis not present

## 2016-03-31 DIAGNOSIS — Z823 Family history of stroke: Secondary | ICD-10-CM | POA: Diagnosis not present

## 2016-03-31 DIAGNOSIS — F17210 Nicotine dependence, cigarettes, uncomplicated: Secondary | ICD-10-CM | POA: Diagnosis not present

## 2016-03-31 DIAGNOSIS — Z8249 Family history of ischemic heart disease and other diseases of the circulatory system: Secondary | ICD-10-CM | POA: Diagnosis not present

## 2016-03-31 DIAGNOSIS — Z7902 Long term (current) use of antithrombotics/antiplatelets: Secondary | ICD-10-CM | POA: Insufficient documentation

## 2016-03-31 DIAGNOSIS — W19XXXA Unspecified fall, initial encounter: Secondary | ICD-10-CM | POA: Insufficient documentation

## 2016-03-31 DIAGNOSIS — N179 Acute kidney failure, unspecified: Secondary | ICD-10-CM | POA: Diagnosis not present

## 2016-03-31 DIAGNOSIS — K219 Gastro-esophageal reflux disease without esophagitis: Secondary | ICD-10-CM | POA: Diagnosis not present

## 2016-03-31 DIAGNOSIS — I10 Essential (primary) hypertension: Secondary | ICD-10-CM | POA: Diagnosis not present

## 2016-03-31 DIAGNOSIS — E876 Hypokalemia: Secondary | ICD-10-CM | POA: Diagnosis not present

## 2016-03-31 DIAGNOSIS — Z7982 Long term (current) use of aspirin: Secondary | ICD-10-CM | POA: Insufficient documentation

## 2016-03-31 DIAGNOSIS — Z79899 Other long term (current) drug therapy: Secondary | ICD-10-CM | POA: Insufficient documentation

## 2016-03-31 DIAGNOSIS — R202 Paresthesia of skin: Secondary | ICD-10-CM

## 2016-03-31 DIAGNOSIS — J45909 Unspecified asthma, uncomplicated: Secondary | ICD-10-CM | POA: Insufficient documentation

## 2016-03-31 DIAGNOSIS — M6281 Muscle weakness (generalized): Secondary | ICD-10-CM

## 2016-03-31 DIAGNOSIS — S7002XA Contusion of left hip, initial encounter: Secondary | ICD-10-CM

## 2016-03-31 DIAGNOSIS — S40012A Contusion of left shoulder, initial encounter: Secondary | ICD-10-CM | POA: Insufficient documentation

## 2016-03-31 DIAGNOSIS — R2681 Unsteadiness on feet: Secondary | ICD-10-CM

## 2016-03-31 DIAGNOSIS — G459 Transient cerebral ischemic attack, unspecified: Secondary | ICD-10-CM

## 2016-03-31 DIAGNOSIS — E785 Hyperlipidemia, unspecified: Secondary | ICD-10-CM | POA: Insufficient documentation

## 2016-03-31 DIAGNOSIS — R200 Anesthesia of skin: Secondary | ICD-10-CM

## 2016-03-31 LAB — CBC
HEMATOCRIT: 36.8 % (ref 35.0–47.0)
Hemoglobin: 12.7 g/dL (ref 12.0–16.0)
MCH: 29.1 pg (ref 26.0–34.0)
MCHC: 34.5 g/dL (ref 32.0–36.0)
MCV: 84.4 fL (ref 80.0–100.0)
PLATELETS: 272 10*3/uL (ref 150–440)
RBC: 4.36 MIL/uL (ref 3.80–5.20)
RDW: 14.5 % (ref 11.5–14.5)
WBC: 8.8 10*3/uL (ref 3.6–11.0)

## 2016-03-31 LAB — DIFFERENTIAL
BASOS ABS: 0.1 10*3/uL (ref 0–0.1)
BASOS PCT: 1 %
Eosinophils Absolute: 0.4 10*3/uL (ref 0–0.7)
Eosinophils Relative: 4 %
LYMPHS PCT: 44 %
Lymphs Abs: 3.8 10*3/uL — ABNORMAL HIGH (ref 1.0–3.6)
MONO ABS: 0.8 10*3/uL (ref 0.2–0.9)
Monocytes Relative: 9 %
NEUTROS ABS: 3.7 10*3/uL (ref 1.4–6.5)
NEUTROS PCT: 42 %

## 2016-03-31 LAB — LIPID PANEL
Cholesterol: 154 mg/dL (ref 0–200)
HDL: 36 mg/dL — ABNORMAL LOW (ref >40)
LDL Cholesterol: 82 mg/dL (ref 0–99)
Total CHOL/HDL Ratio: 4.3 RATIO
Triglycerides: 180 mg/dL — ABNORMAL HIGH (ref <150)
VLDL: 36 mg/dL (ref 0–40)

## 2016-03-31 LAB — URINALYSIS COMPLETE WITH MICROSCOPIC (ARMC ONLY)
Bacteria, UA: NONE SEEN
Bilirubin Urine: NEGATIVE
Glucose, UA: NEGATIVE mg/dL
Hgb urine dipstick: NEGATIVE
Ketones, ur: NEGATIVE mg/dL
Leukocytes, UA: NEGATIVE
Nitrite: NEGATIVE
Protein, ur: NEGATIVE mg/dL
Specific Gravity, Urine: 1.015 (ref 1.005–1.030)
pH: 5 (ref 5.0–8.0)

## 2016-03-31 LAB — ECHOCARDIOGRAM COMPLETE
Height: 65 in
Weight: 3168 oz

## 2016-03-31 LAB — TROPONIN I: Troponin I: <0.03 ng/mL (ref <0.03)

## 2016-03-31 LAB — PROTIME-INR
INR: 0.99
PROTHROMBIN TIME: 13.1 s (ref 11.4–15.2)

## 2016-03-31 LAB — BASIC METABOLIC PANEL
Anion gap: 6 (ref 5–15)
BUN: 22 mg/dL — AB (ref 6–20)
CHLORIDE: 103 mmol/L (ref 101–111)
CO2: 29 mmol/L (ref 22–32)
CREATININE: 1.01 mg/dL — AB (ref 0.44–1.00)
Calcium: 9.2 mg/dL (ref 8.9–10.3)
GFR calc Af Amer: >60 mL/min (ref >60)
GFR calc non Af Amer: >60 mL/min (ref >60)
GLUCOSE: 87 mg/dL (ref 65–99)
POTASSIUM: 3.3 mmol/L — AB (ref 3.5–5.1)
Sodium: 138 mmol/L (ref 135–145)

## 2016-03-31 LAB — APTT: APTT: 30 s (ref 24–36)

## 2016-03-31 LAB — TSH: TSH: 1.989 u[IU]/mL (ref 0.350–4.500)

## 2016-03-31 MED ORDER — ONDANSETRON HCL 4 MG/2ML IJ SOLN
4.0000 mg | Freq: Once | INTRAMUSCULAR | Status: AC
  Administered 2016-03-31: 4 mg via INTRAVENOUS
  Filled 2016-03-31: qty 2

## 2016-03-31 MED ORDER — POTASSIUM CHLORIDE IN NACL 40-0.9 MEQ/L-% IV SOLN
INTRAVENOUS | Status: DC
  Administered 2016-03-31: 07:00:00 125 mL/h via INTRAVENOUS
  Filled 2016-03-31 (×6): qty 1000

## 2016-03-31 MED ORDER — HYDROCHLOROTHIAZIDE 25 MG PO TABS
25.0000 mg | ORAL_TABLET | Freq: Every day | ORAL | Status: DC
  Administered 2016-03-31 – 2016-04-01 (×2): 25 mg via ORAL
  Filled 2016-03-31 (×2): qty 1

## 2016-03-31 MED ORDER — TRAMADOL HCL 50 MG PO TABS
50.0000 mg | ORAL_TABLET | Freq: Three times a day (TID) | ORAL | Status: DC | PRN
  Administered 2016-03-31 – 2016-04-01 (×2): 50 mg via ORAL
  Filled 2016-03-31 (×2): qty 1

## 2016-03-31 MED ORDER — ASPIRIN EC 81 MG PO TBEC
81.0000 mg | DELAYED_RELEASE_TABLET | Freq: Every day | ORAL | Status: DC
  Administered 2016-03-31 – 2016-04-01 (×2): 81 mg via ORAL
  Filled 2016-03-31 (×2): qty 1

## 2016-03-31 MED ORDER — ACETAMINOPHEN 325 MG PO TABS
650.0000 mg | ORAL_TABLET | Freq: Four times a day (QID) | ORAL | Status: DC | PRN
  Administered 2016-03-31: 650 mg via ORAL
  Filled 2016-03-31: qty 2

## 2016-03-31 MED ORDER — STROKE: EARLY STAGES OF RECOVERY BOOK
Freq: Once | Status: AC
  Administered 2016-03-31: 06:00:00

## 2016-03-31 MED ORDER — CYCLOBENZAPRINE HCL 10 MG PO TABS
10.0000 mg | ORAL_TABLET | Freq: Three times a day (TID) | ORAL | Status: DC | PRN
  Administered 2016-03-31 (×3): 10 mg via ORAL
  Filled 2016-03-31 (×3): qty 1

## 2016-03-31 MED ORDER — HEPARIN SODIUM (PORCINE) 5000 UNIT/ML IJ SOLN
5000.0000 [IU] | Freq: Three times a day (TID) | INTRAMUSCULAR | Status: DC
  Administered 2016-03-31 (×3): 5000 [IU] via SUBCUTANEOUS
  Filled 2016-03-31 (×3): qty 1

## 2016-03-31 MED ORDER — SODIUM CHLORIDE 0.9% FLUSH
3.0000 mL | Freq: Two times a day (BID) | INTRAVENOUS | Status: DC
  Administered 2016-03-31: 3 mL via INTRAVENOUS

## 2016-03-31 MED ORDER — CLOPIDOGREL BISULFATE 75 MG PO TABS
75.0000 mg | ORAL_TABLET | Freq: Every day | ORAL | Status: DC
  Administered 2016-03-31 – 2016-04-01 (×2): 75 mg via ORAL
  Filled 2016-03-31 (×2): qty 1

## 2016-03-31 MED ORDER — ASPIRIN 81 MG PO CHEW
324.0000 mg | CHEWABLE_TABLET | Freq: Once | ORAL | Status: AC
  Administered 2016-03-31: 324 mg via ORAL
  Filled 2016-03-31: qty 4

## 2016-03-31 MED ORDER — ALBUTEROL SULFATE (2.5 MG/3ML) 0.083% IN NEBU: 3.0000 mL | INHALATION_SOLUTION | RESPIRATORY_TRACT | Status: DC | PRN

## 2016-03-31 MED ORDER — ONDANSETRON HCL 4 MG/2ML IJ SOLN
4.0000 mg | Freq: Four times a day (QID) | INTRAMUSCULAR | Status: DC | PRN
  Administered 2016-03-31: 4 mg via INTRAVENOUS
  Filled 2016-03-31: qty 2

## 2016-03-31 MED ORDER — MORPHINE SULFATE (PF) 4 MG/ML IV SOLN
4.0000 mg | Freq: Once | INTRAVENOUS | Status: AC
  Administered 2016-03-31: 4 mg via INTRAVENOUS
  Filled 2016-03-31: qty 1

## 2016-03-31 MED ORDER — ACETAMINOPHEN 650 MG RE SUPP: 650.0000 mg | Freq: Four times a day (QID) | RECTAL | Status: DC | PRN

## 2016-03-31 MED ORDER — DOCUSATE SODIUM 100 MG PO CAPS
100.0000 mg | ORAL_CAPSULE | Freq: Two times a day (BID) | ORAL | Status: DC
  Administered 2016-03-31 – 2016-04-01 (×3): 100 mg via ORAL
  Filled 2016-03-31 (×3): qty 1

## 2016-03-31 MED ORDER — ATORVASTATIN CALCIUM 20 MG PO TABS
20.0000 mg | ORAL_TABLET | Freq: Every day | ORAL | Status: DC
  Administered 2016-03-31 – 2016-04-01 (×2): 20 mg via ORAL
  Filled 2016-03-31 (×2): qty 1

## 2016-03-31 MED ORDER — ONDANSETRON HCL 4 MG PO TABS: 4.0000 mg | ORAL_TABLET | Freq: Four times a day (QID) | ORAL | Status: DC | PRN

## 2016-03-31 MED ORDER — PANTOPRAZOLE SODIUM 40 MG PO TBEC
40.0000 mg | DELAYED_RELEASE_TABLET | Freq: Every day | ORAL | Status: DC
  Administered 2016-03-31 – 2016-04-01 (×2): 40 mg via ORAL
  Filled 2016-03-31 (×2): qty 1

## 2016-03-31 MED ORDER — NICOTINE 21 MG/24HR TD PT24
21.0000 mg | MEDICATED_PATCH | Freq: Every day | TRANSDERMAL | Status: DC
  Administered 2016-03-31 – 2016-04-01 (×2): 21 mg via TRANSDERMAL
  Filled 2016-03-31 (×2): qty 1

## 2016-03-31 NOTE — ED Notes (Signed)
Pt to CT

## 2016-03-31 NOTE — Progress Notes (Signed)
Assessment & Plan  50 year old female with past medical history of hypertension, hyperlipidemia, history of previous CVA, GERD, asthma sent to the hospital after a mechanical fall earlier this morning and also noted to have left-sided weakness.   1. Left-sided weakness-patient suspected to have a possible CVA given her history. -CT head negative. Await MRI brain, carotid duplex, echocardiogram. -Continue aspirin, continue Plavix. Patient apparently was not taking her Plavix for 2 weeks prior to admission. -Await further neurological evaluation, follow neuro checks. -Await physical therapy evaluation, speech evaluation.  2. Hyperlipidemia-continue atorvastatin.  3. Essential hypertension-hemodynamically stable. Continue HCTZ  4. GERD - cont. Protonix.   5. Tobacco abuse - cont. Nicotine patch.

## 2016-03-31 NOTE — ED Notes (Signed)
Neuro SOC checked in att

## 2016-03-31 NOTE — ED Notes (Signed)
Call to neuro [facility] for pt

## 2016-03-31 NOTE — Plan of Care (Signed)
Problem: Pain Managment: Goal: General experience of comfort will improve Outcome: Progressing Zofran given for nausea. Improvement noted.  Problem: Tissue Perfusion: Goal: Risk factors for ineffective tissue perfusion will decrease Outcome: Progressing Heparin given as ordered. SCD's ordered and placed.

## 2016-03-31 NOTE — H&P (Signed)
Jocelyn Simon is an 50 y.o. female.   Chief Complaint: Fall HPI: The patient with past medical history of strokes presents to the emergency department after suffering a fall. She landed on her left shoulder which she injured but suffered no fractures on x-ray. Physical exam was notable for left sided weakness of her upper and lower extremity. CT of her head was negative for acute process however, due to her persistent weakness emergency department staff called for admission.  Past Medical History:  Diagnosis Date  . Asthma   . GERD (gastroesophageal reflux disease)   . Headache   . Hyperlipidemia   . Hypertension   . Stroke [facility])     Past Surgical History:  Procedure Laterality Date  . LAPAROSCOPIC SALPINGO OOPHERECTOMY Bilateral 12/20/2015   Procedure: LAPAROSCOPIC BILATERAL SALPINGO OOPHORECTOMY WITH PELVIC WASHINGS;  Surgeon: Benjaman Kindler, MD;  Location: [facility] ORS;  Service: Gynecology;  Laterality: Bilateral;    Family History  Problem Relation Age of Onset  . CAD Mother   . CAD Sister   . CVA Other    Social History:  reports that she has been smoking Cigarettes.  She has been smoking about 0.25 packs per day. She has never used smokeless tobacco. She reports that she does not drink alcohol or use drugs.  Allergies: No Known Allergies  Medications Prior to Admission  Medication Sig Dispense Refill  . acetaminophen (TYLENOL) 325 MG tablet Take 2 tablets (650 mg total) by mouth every 6 (six) hours as needed for moderate pain (headache).    Marland Kitchen albuterol (VENTOLIN HFA) 108 (90 Base) MCG/ACT inhaler INHALE 2 PUFFS EVERY SIX HOURS AS NEEDED FOR COUGH, WHEEZING OR SHORTNESS OF BREATH. 54 g 0  . aspirin EC 81 MG tablet Take 1 tablet (81 mg total) by mouth daily. 90 tablet 3  . atorvastatin (LIPITOR) 20 MG tablet Take 1 tablet (20 mg total) by mouth daily. 90 tablet 1  . hydrochlorothiazide (HYDRODIURIL) 25 MG tablet Take 1 tablet (25 mg total) by mouth daily. 30 tablet 3  .  ibuprofen (ADVIL,MOTRIN) 800 MG tablet Take 1 tablet (800 mg total) by mouth every 8 (eight) hours as needed for moderate pain. 30 tablet 3  . pantoprazole (PROTONIX) 40 MG tablet Take 1 tablet (40 mg total) by mouth daily. 90 tablet 1  . clopidogrel (PLAVIX) 75 MG tablet Take 1 tablet (75 mg total) by mouth daily. (Patient not taking: Reported on 03/31/2016) 90 tablet 3  . cyclobenzaprine (FLEXERIL) 10 MG tablet Take 1 tablet (10 mg total) by mouth 3 (three) times daily as needed for muscle spasms. (Patient not taking: Reported on 03/31/2016) 30 tablet 2  . nicotine (NICOTROL) 10 MG inhaler Inhale 1 cartridge (1 continuous puffing total) into the lungs as needed for smoking cessation (May inhale contents of 6 to 12 cartridges per day, gradually decreasing over time.). (Patient not taking: Reported on 03/31/2016) 42 each 1    Results for orders placed or performed during the hospital encounter of 03/31/16 (from the past 48 hour(s))  Basic metabolic panel     Status: Abnormal   Collection Time: 03/31/16 12:28 AM  Result Value Ref Range   Sodium 138 135 - 145 mmol/L   Potassium 3.3 (L) 3.5 - 5.1 mmol/L   Chloride 103 101 - 111 mmol/L   CO2 29 22 - 32 mmol/L   Glucose, Bld 87 65 - 99 mg/dL   BUN 22 (H) 6 - 20 mg/dL   Creatinine, Ser 1.01 (H) 0.44 -  1.00 mg/dL   Calcium 9.2 8.9 - 10.3 mg/dL   GFR calc non Af Amer >60 >60 mL/min   GFR calc Af Amer >60 >60 mL/min    Comment: (NOTE) The eGFR has been calculated using the CKD EPI equation. This calculation has not been validated in all clinical situations. eGFR's persistently <60 mL/min signify possible Chronic Kidney Disease.    Anion gap 6 5 - 15  CBC     Status: None   Collection Time: 03/31/16 12:28 AM  Result Value Ref Range   WBC 8.8 3.6 - 11.0 K/uL   RBC 4.36 3.80 - 5.20 MIL/uL   Hemoglobin 12.7 12.0 - 16.0 g/dL   HCT 36.8 35.0 - 47.0 %   MCV 84.4 80.0 - 100.0 fL   MCH 29.1 26.0 - 34.0 pg   MCHC 34.5 32.0 - 36.0 g/dL   RDW 14.5 11.5  - 14.5 %   Platelets 272 150 - 440 K/uL  Troponin I     Status: None   Collection Time: 03/31/16 12:28 AM  Result Value Ref Range   Troponin I <0.03 <0.03 ng/mL  Differential     Status: Abnormal   Collection Time: 03/31/16 12:28 AM  Result Value Ref Range   Neutrophils Relative % 42 %   Neutro Abs 3.7 1.4 - 6.5 K/uL   Lymphocytes Relative 44 %   Lymphs Abs 3.8 (H) 1.0 - 3.6 K/uL   Monocytes Relative 9 %   Monocytes Absolute 0.8 0.2 - 0.9 K/uL   Eosinophils Relative 4 %   Eosinophils Absolute 0.4 0 - 0.7 K/uL   Basophils Relative 1 %   Basophils Absolute 0.1 0 - 0.1 K/uL  Protime-INR     Status: None   Collection Time: 03/31/16 12:28 AM  Result Value Ref Range   Prothrombin Time 13.1 11.4 - 15.2 seconds   INR 0.99   APTT     Status: None   Collection Time: 03/31/16 12:28 AM  Result Value Ref Range   aPTT 30 24 - 36 seconds   Dg Chest 2 View  Result Date: 03/31/2016 CLINICAL DATA:  Chest pain, dizziness, and weakness for 3 days. EXAM: CHEST  2 VIEW COMPARISON:  12/17/2015 FINDINGS: The heart size and mediastinal contours are within normal limits. Both lungs are clear. The visualized skeletal structures are unremarkable. IMPRESSION: No active cardiopulmonary disease. Electronically Signed   By: Lucienne Capers M.D.   On: 03/31/2016 01:00   Dg Pelvis 1-2 Views  Result Date: 03/31/2016 CLINICAL DATA:  Status post fall.  Left hip pain. EXAM: PELVIS - 1-2 VIEW COMPARISON:  None. FINDINGS: There is no evidence of pelvic fracture or diastasis. No pelvic bone lesions are seen. There is transitional lumbosacral anatomy with suspected sacralization of the L5 vertebral body. IMPRESSION: No acute fracture of the pelvis. Electronically Signed   By: Ulyses Jarred M.D.   On: 03/31/2016 01:47   Dg Knee Complete 4 Views Left  Result Date: 03/31/2016 CLINICAL DATA:  Left knee pain after a fall last night. EXAM: LEFT KNEE - COMPLETE 4+ VIEW COMPARISON:  None. FINDINGS: No evidence of fracture,  dislocation, or joint effusion. No evidence of arthropathy or other focal bone abnormality. Soft tissues are unremarkable. IMPRESSION: Negative. Electronically Signed   By: Lucienne Capers M.D.   On: 03/31/2016 01:46   Dg Humerus Left  Result Date: 03/31/2016 CLINICAL DATA:  Left arm pain after a fall last night. EXAM: LEFT HUMERUS - 2+ VIEW COMPARISON:  None.  FINDINGS: There is no evidence of fracture or other focal bone lesions. Soft tissues are unremarkable. IMPRESSION: Negative. Electronically Signed   By: Lucienne Capers M.D.   On: 03/31/2016 01:45   Ct Head Code Stroke W/o Cm  Result Date: 03/31/2016 CLINICAL DATA:  Code stroke.  Left-sided weakness EXAM: CT HEAD WITHOUT CONTRAST TECHNIQUE: Contiguous axial images were obtained from the base of the skull through the vertex without intravenous contrast. COMPARISON:  Brain MRI 04/08/2015 FINDINGS: Brain: No mass lesion, intraparenchymal hemorrhage or extra-axial collection. No evidence of acute cortical infarct. Brain parenchyma and CSF-containing spaces are normal for age. Vascular: No hyperdense vessel or unexpected calcification. Skull: Normal visualized skull base, calvarium and extracranial soft tissues. Sinuses/Orbits: No sinus fluid levels or advanced mucosal thickening. No mastoid effusion. Normal orbits. ASPECTS [facility] Stroke Program Early CT Score) - Ganglionic level infarction (caudate, lentiform nuclei, internal capsule, insula, M1-M3 cortex): 7 - Supraganglionic infarction (M4-M6 cortex): 3 Total score (0-10 with 10 being normal): 10 IMPRESSION: 1. No acute intracranial abnormality.  Normal head CT. 2. ASPECTS is 10. These results were called by telephone at the time of interpretation on 03/31/2016 at 1:44 am to Dr. Larae Grooms , who verbally acknowledged these results. Electronically Signed   By: Ulyses Jarred M.D.   On: 03/31/2016 01:45    Review of Systems  Constitutional: Negative for chills and fever.  HENT: Negative for  sore throat and tinnitus.   Eyes: Positive for blurred vision. Negative for redness.  Respiratory: Negative for cough and shortness of breath.   Cardiovascular: Negative for chest pain, palpitations, orthopnea and PND.  Gastrointestinal: Negative for abdominal pain, diarrhea, nausea and vomiting.  Genitourinary: Negative for dysuria, frequency and urgency.  Musculoskeletal: Positive for falls and joint pain. Negative for myalgias.  Skin: Negative for rash.       No lesions  Neurological: Positive for focal weakness. Negative for speech change and weakness.  Endo/Heme/Allergies: Does not bruise/bleed easily.       No temperature intolerance  Psychiatric/Behavioral: Negative for depression and suicidal ideas.    Blood pressure (!) 121/94, pulse 93, temperature 98.9 F (37.2 C), temperature source Oral, resp. rate 20, height 5' 5"  (1.651 m), weight 86.2 kg (190 lb), SpO2 98 %. Physical Exam  Vitals reviewed. Constitutional: She is oriented to person, place, and time. She appears well-developed and well-nourished. No distress.  HENT:  Head: Normocephalic and atraumatic.  Mouth/Throat: Oropharynx is clear and moist.  Eyes: Conjunctivae and EOM are normal. Pupils are equal, round, and reactive to light. No scleral icterus.  Neck: Normal range of motion. Neck supple. No JVD present. No tracheal deviation present. No thyromegaly present.  Cardiovascular: Normal rate, regular rhythm and normal heart sounds.  Exam reveals no gallop and no friction rub.   No murmur heard. Respiratory: Effort normal and breath sounds normal.  GI: Soft. Bowel sounds are normal. She exhibits no distension. There is no tenderness.  Genitourinary:  Genitourinary Comments: Deferred  Musculoskeletal: Normal range of motion. She exhibits no edema.  Lymphadenopathy:    She has no cervical adenopathy.  Neurological: She is alert and oriented to person, place, and time. No cranial nerve deficit. She exhibits normal  muscle tone.  Skin: Skin is warm and dry. No rash noted. No erythema.  Psychiatric: She has a normal mood and affect. Her behavior is normal. Judgment and thought content normal.     Assessment/Plan This is a 50 year old female admitted for left-sided weakness. 1. Weakness: Unclear if the  patient is not giving her full effort or if she has actual neurologic weakness of her left side. Carotid ultrasounds and MRI of the brain ordered. The patient has been off of her Plavix for at least a week. Resume antiplatelet therapy. Neurology consult after imaging complete 2. Essential hypertension: Controlled; continue hydrochlorothiazide per home regimen 3. Acute kidney injury: doubling of creatinine although GFR is still within normal limits. Also, the patient has mild hypokalemia. This could be contributing to weakness resulting in fall rather than neurologic insult. 4. Hypokalemia: Replete potassium. 5. Tobacco abuse: NicoDerm patch 6. DVT prophylaxis: Heparin 7. GI prophylaxis: Pantoprazole per home regimen The patient is a full code. Time spent on admission orders and patient care approximately 45 minutes  Harrie Foreman, MD 03/31/2016, 3:30 AM

## 2016-03-31 NOTE — Care Management Important Message (Deleted)
Important Message  Patient Details  Name: Jocelyn Simon MRN: [id number] Date of Birth: Oct 30, 1965   Medicare Important Message Given:  Yes    Inessa Wardrop A, RN 03/31/2016, 2:49 PM

## 2016-03-31 NOTE — ED Provider Notes (Signed)
[facility] Emergency Department Provider Note   ____________________________________________   First MD Initiated Contact with Patient 03/31/16 0024     (approximate)  I have reviewed the triage vital signs and the nursing notes.   HISTORY  Chief Complaint Fall   HPI TANEJA QUATTROCHI is a 50 y.o. female with a history of stroke who is presenting to the emergency department after fall and left-sided weakness. She says that she was very busy today and began feeling weak, generalized, at about 9 PM tonight. At about 11 PM she says that she began to feel lightheaded and fell onto her left side. She says that ever since then she has had weakness to her left side. Says that the majority of the penis or left shoulder as well as left hip. Unclear loss of consciousness after the fall but denies any headache at this time. Says that she is supposed be on Plavix for her strokes but stopped taking it 2-3 weeks ago because was making her nauseous. Also en route, she was complaining of pins and needle type chest pain to the left chest. It was nonradiating and has resolved at this time. Denies any shortness of breath this time as well.   Past Medical History:  Diagnosis Date  . Asthma   . GERD (gastroesophageal reflux disease)   . Headache   . Hyperlipidemia   . Hypertension   . Stroke [facility])     Patient Active Problem List   Diagnosis Date Noted  . Pelvic pain in female 12/20/2015  . Prediabetes 09/19/2015  . Left-sided weakness 04/07/2015    Past Surgical History:  Procedure Laterality Date  . LAPAROSCOPIC SALPINGO OOPHERECTOMY Bilateral 12/20/2015   Procedure: LAPAROSCOPIC BILATERAL SALPINGO OOPHORECTOMY WITH PELVIC WASHINGS;  Surgeon: Benjaman Kindler, MD;  Location: [facility] ORS;  Service: Gynecology;  Laterality: Bilateral;    Prior to Admission medications   Medication Sig Start Date End Date Taking? Authorizing Provider  acetaminophen (TYLENOL) 325 MG  tablet Take 2 tablets (650 mg total) by mouth every 6 (six) hours as needed for moderate pain (headache). 02/15/16   Juluis Pitch, MD  albuterol (VENTOLIN HFA) 108 (90 Base) MCG/ACT inhaler INHALE 2 PUFFS EVERY SIX HOURS AS NEEDED FOR COUGH, WHEEZING OR SHORTNESS OF BREATH. 02/15/16   Juluis Pitch, MD  aspirin EC 81 MG tablet Take 1 tablet (81 mg total) by mouth daily. 02/15/16   Juluis Pitch, MD  atorvastatin (LIPITOR) 20 MG tablet Take 1 tablet (20 mg total) by mouth daily. 02/15/16   Juluis Pitch, MD  clopidogrel (PLAVIX) 75 MG tablet Take 1 tablet (75 mg total) by mouth daily. 02/15/16   Juluis Pitch, MD  cyclobenzaprine (FLEXERIL) 10 MG tablet Take 1 tablet (10 mg total) by mouth 3 (three) times daily as needed for muscle spasms. 02/15/16   Juluis Pitch, MD  hydrochlorothiazide (HYDRODIURIL) 25 MG tablet Take 1 tablet (25 mg total) by mouth daily. 02/15/16   Juluis Pitch, MD  ibuprofen (ADVIL,MOTRIN) 800 MG tablet Take 1 tablet (800 mg total) by mouth every 8 (eight) hours as needed for moderate pain. 12/22/15   Benjaman Kindler, MD  nicotine (NICOTROL) 10 MG inhaler Inhale 1 cartridge (1 continuous puffing total) into the lungs as needed for smoking cessation (May inhale contents of 6 to 12 cartridges per day, gradually decreasing over time.). 02/15/16   Juluis Pitch, MD  pantoprazole (PROTONIX) 40 MG tablet Take 1 tablet (40 mg total) by mouth daily. 02/15/16   Juluis Pitch, MD  Allergies Review of patient's allergies indicates no known allergies.  Family History  Problem Relation Age of Onset  . CAD Mother   . CAD Sister   . CVA Other     Social History Social History  Substance Use Topics  . Smoking status: Current Every Day Smoker    Packs/day: 0.25    Types: Cigarettes  . Smokeless tobacco: Never Used  . Alcohol use No    Review of Systems Constitutional: No fever/chills Eyes: No visual changes. ENT: No sore throat. Cardiovascular: As  above Respiratory: Denies shortness of breath. Gastrointestinal: No abdominal pain.  No nausea, no vomiting.  No diarrhea.  No constipation. Genitourinary: Negative for dysuria. Musculoskeletal: Negative for back pain. Skin: Negative for rash. Neurological: Negative for headaches, focal weakness or numbness.  10-point ROS otherwise negative.  ____________________________________________   PHYSICAL EXAM:  VITAL SIGNS: ED Triage Vitals  Enc Vitals Group     BP 03/31/16 0035 (!) 154/95     Pulse Rate 03/31/16 0035 92     Resp 03/31/16 0035 19     Temp 03/31/16 0035 98.9 F (37.2 C)     Temp Source 03/31/16 0035 Oral     SpO2 03/31/16 0035 98 %     Weight 03/31/16 0036 190 lb (86.2 kg)     Height 03/31/16 0036 5\' 5"  (1.651 m)     Head Circumference --      Peak Flow --      Pain Score 03/31/16 0036 10     Pain Loc --      Pain Edu? --      Excl. in [place]? --     Constitutional: Alert and oriented. Well appearing and in no acute distress. Eyes: Conjunctivae are normal. PERRL. EOMI. Head: Atraumatic. Nose: No congestion/rhinnorhea. Mouth/Throat: Mucous membranes are moist.   Neck: No stridor.   Cardiovascular: Normal rate, regular rhythm. Grossly normal heart sounds.  Good peripheral circulation With intact and equal radial as well as dorsalis pedis pulses. Respiratory: Normal respiratory effort.  No retractions. Lungs CTAB. Gastrointestinal: Soft and nontender. No distention.  Musculoskeletal: No lower extremity edema. Mild tenderness left hip. Also tenderness diffusely left knee without any ligamentous laxity. 4-5 strength the left upper and lower extremities. Tenderness palpation to the left trapezius as well as the proximal humerus without any swelling or deformity. Neurologic:  Normal speech and language.  Skin:  Skin is warm, dry and intact. No rash noted. Psychiatric: Mood and affect are normal. Speech and behavior are normal.  NIH Stroke Scale   Person Administering  Scale: Doran Stabler  Administer stroke scale items in the order listed. Record performance in each category after each subscale exam. Do not go back and change scores. Follow directions provided for each exam technique. Scores should reflect what the patient does, not what the clinician thinks the patient can do. The clinician should record answers while administering the exam and work quickly. Except where indicated, the patient should not be coached (i.e., repeated requests to patient to make a special effort).   1a  Level of consciousness: 0=alert; keenly responsive  1b. LOC questions:  0=Performs both tasks correctly  1c. LOC commands: 0=Performs both tasks correctly  2.  Best Gaze: 0=normal  3.  Visual: 0=No visual loss  4. Facial Palsy: 0=Normal symmetric movement  5a.  Motor left arm: 2=Some effort against gravity, limb cannot get to or maintain (if cured) 90 (or 45) degrees, drifts down to bed, but has  some effort against gravity  5b.  Motor right arm: 0=No drift, limb holds 90 (or 45) degrees for full 10 seconds  6a. motor left leg: 2=Some effort against gravity, limb cannot get to or maintain (if cured) 90 (or 45) degrees, drifts down to bed, but has some effort against gravity  6b  Motor right leg:  0=No drift, limb holds 90 (or 45) degrees for full 10 seconds  7. Limb Ataxia: 0=Absent  8.  Sensory: 0=Normal; no sensory loss  9. Best Language:  0=No aphasia, normal  10. Dysarthria: 0=Normal  11. Extinction and Inattention: 0=No abnormality  12. Distal motor function: 0=Normal   Total:   4   ____________________________________________   LABS (all labs ordered are listed, but only abnormal results are displayed)  Labs Reviewed  BASIC METABOLIC PANEL - Abnormal; Notable for the following:       Result Value   Potassium 3.3 (*)    BUN 22 (*)    Creatinine, Ser 1.01 (*)    All other components within normal limits  DIFFERENTIAL - Abnormal; Notable for the following:      Lymphs Abs 3.8 (*)    All other components within normal limits  CBC  TROPONIN I  PROTIME-INR  APTT  URINALYSIS COMPLETEWITH MICROSCOPIC ([facility] ONLY)   ____________________________________________  EKG  ED ECG REPORT I, Doran Stabler, the attending physician, personally viewed and interpreted this ECG.   Date: 03/31/2016  EKG Time: 0025  Rate: 88  Rhythm: normal sinus rhythm  Axis: Normal  Intervals:none  ST&T Change: No ST segment elevation or depression. No abnormal T-wave inversion.  ____________________________________________  RADIOLOGY  DG Humerus Left (Accession [id number]) (Order [id number])  Imaging  Date: 03/31/2016 Department: [facility] EMERGENCY DEPARTMENT Released By/Authorizing: Orbie Pyo, MD (auto-released)  Exam Information   Status Exam Begun  Exam Ended   Final [99] 03/31/2016 1:25 AM 03/31/2016 1:36 AM  PACS Images   Show images for DG Humerus Left  Study Result   CLINICAL DATA:  Left arm pain after a fall last night.  EXAM: LEFT HUMERUS - 2+ VIEW  COMPARISON:  None.  FINDINGS: There is no evidence of fracture or other focal bone lesions. Soft tissues are unremarkable.  IMPRESSION: Negative.   Electronically Signed   By: Lucienne Capers M.D.   On: 03/31/2016 01:45    DG Knee Complete 4 Views Left (Accession [id number]) (Order [id number])  Imaging  Date: 03/31/2016 Department: [facility] EMERGENCY DEPARTMENT Released By/Authorizing: Orbie Pyo, MD (auto-released)  Exam Information   Status Exam Begun  Exam Ended   Final [99] 03/31/2016 1:25 AM 03/31/2016 1:36 AM  PACS Images   Show images for DG Knee Complete 4 Views Left  Study Result   CLINICAL DATA:  Left knee pain after a fall last night.  EXAM: LEFT KNEE - COMPLETE 4+ VIEW  COMPARISON:  None.  FINDINGS: No evidence of fracture, dislocation, or joint effusion. No evidence of  arthropathy or other focal bone abnormality. Soft tissues are unremarkable.  IMPRESSION: Negative.   Electronically Signed   By: Lucienne Capers M.D.   On: 03/31/2016 01:46   DG Pelvis 1-2 Views (Accession [id number]) (Order [id number])  Imaging  Date: 03/31/2016 Department: [facility] EMERGENCY DEPARTMENT Released By/Authorizing: Orbie Pyo, MD (auto-released)  Exam Information   Status Exam Begun  Exam Ended   Final [99] 03/31/2016 1:25 AM 03/31/2016 1:36 AM  PACS Images   Show images  for DG Pelvis 1-2 Views  Study Result   CLINICAL DATA:  Status post fall.  Left hip pain.  EXAM: PELVIS - 1-2 VIEW  COMPARISON:  None.  FINDINGS: There is no evidence of pelvic fracture or diastasis. No pelvic bone lesions are seen. There is transitional lumbosacral anatomy with suspected sacralization of the L5 vertebral body.  IMPRESSION: No acute fracture of the pelvis.   Electronically Signed   By: Ulyses Jarred M.D.   On: 03/31/2016 01:47    CT Head Code Stroke W/O CM (Accession [id number]) (Order [id number])  Imaging  Date: 03/31/2016 Department: [facility] EMERGENCY DEPARTMENT Released By/Authorizing: Orbie Pyo, MD (auto-released)  Exam Information   Status Exam Begun  Exam Ended   Final [99] 03/31/2016 1:22 AM 03/31/2016 1:38 AM  PACS Images   Show images for CT Head Code Stroke W/O CM  Study Result   CLINICAL DATA:  Code stroke.  Left-sided weakness  EXAM: CT HEAD WITHOUT CONTRAST  TECHNIQUE: Contiguous axial images were obtained from the base of the skull through the vertex without intravenous contrast.  COMPARISON:  Brain MRI 04/08/2015  FINDINGS: Brain: No mass lesion, intraparenchymal hemorrhage or extra-axial collection. No evidence of acute cortical infarct. Brain parenchyma and CSF-containing spaces are normal for age.  Vascular: No hyperdense vessel or  unexpected calcification.  Skull: Normal visualized skull base, calvarium and extracranial soft tissues.  Sinuses/Orbits: No sinus fluid levels or advanced mucosal thickening. No mastoid effusion. Normal orbits.  ASPECTS [facility] Stroke Program Early CT Score)  - Ganglionic level infarction (caudate, lentiform nuclei, internal capsule, insula, M1-M3 cortex): 7  - Supraganglionic infarction (M4-M6 cortex): 3  Total score (0-10 with 10 being normal): 10  IMPRESSION: 1. No acute intracranial abnormality.  Normal head CT. 2. ASPECTS is 10.  These results were called by telephone at the time of interpretation on 03/31/2016 at 1:44 am to Dr. Larae Grooms , who verbally acknowledged these results.   Electronically Signed   By: Ulyses Jarred M.D.   On: 03/31/2016 01:45    ____________________________________________   PROCEDURES  Procedure(s) performed:   Procedures  Critical Care performed:   ____________________________________________   INITIAL IMPRESSION / ASSESSMENT AND PLAN / ED COURSE  Pertinent labs & imaging results that were available during my care of the patient were reviewed by me and considered in my medical decision making (see chart for details).  ----------------------------------------- 2:28 AM on 03/31/2016 -----------------------------------------  Discussed the case with Dr. Donivan Scull of the specialist on-call neurology service.  Patient out of the window for TPA. However, she recommends admission to the hospital as well as aspirin and continued stroke workup. Signed out to Dr. Marcille Blanco of the medicine service. Patient was still 4 out of pain to the left upper as well as lower extremities. Patient explained the need for admission to the hospital and she is understanding of the plan and willing to comply.  Clinical Course     ____________________________________________   FINAL CLINICAL IMPRESSION(S) / ED DIAGNOSES  Left-sided  weakness. Left shoulder as well as left hip contusion.    NEW MEDICATIONS STARTED DURING THIS VISIT:  New Prescriptions   No medications on file     Note:  This document was prepared using Dragon voice recognition software and may include unintentional dictation errors.    Orbie Pyo, MD 03/31/16 [phone]

## 2016-03-31 NOTE — Consult Note (Addendum)
Referring Physician: Verdell Carmine    Chief Complaint: Left sided weakness  HPI: Jocelyn Simon is an 50 y.o. female with a history of stroke who reports that she has episodes of worsening weakness on her left since her stroke.  Has difficulty with balance at baseline as well.  On yesterday reports feeling lightheaded then felt weaker on her left side and fell.  Patient reports some period of loss of awareness as well.  Was brought in for evaluation.  Reports that she is still number than usual on the left and her strength is not at baseline but it unclear if this may be related to her pain from the fall.  Initial NIHSS of 6. Patient recently stopped her Plavix due to stomach upset.      Date last known well: Date: 03/30/2016 Time last known well: Time: 23:00 tPA Given: No: Outside time window  Past Medical History:  Diagnosis Date  . Asthma   . GERD (gastroesophageal reflux disease)   . Headache   . Hyperlipidemia   . Hypertension   . Stroke [facility])     Past Surgical History:  Procedure Laterality Date  . LAPAROSCOPIC SALPINGO OOPHERECTOMY Bilateral 12/20/2015   Procedure: LAPAROSCOPIC BILATERAL SALPINGO OOPHORECTOMY WITH PELVIC WASHINGS;  Surgeon: Benjaman Kindler, MD;  Location: [facility] ORS;  Service: Gynecology;  Laterality: Bilateral;    Family History  Problem Relation Age of Onset  . CAD Mother   . CAD Sister   . CVA Other    Social History:  reports that she has been smoking Cigarettes.  She has been smoking about 0.25 packs per day. She has never used smokeless tobacco. She reports that she does not drink alcohol or use drugs.  Allergies: No Known Allergies  Medications:  I have reviewed the patient's current medications. Prior to Admission:  Prescriptions Prior to Admission  Medication Sig Dispense Refill Last Dose  . acetaminophen (TYLENOL) 325 MG tablet Take 2 tablets (650 mg total) by mouth every 6 (six) hours as needed for moderate pain (headache).   Past Week at Unknown  time  . albuterol (VENTOLIN HFA) 108 (90 Base) MCG/ACT inhaler INHALE 2 PUFFS EVERY SIX HOURS AS NEEDED FOR COUGH, WHEEZING OR SHORTNESS OF BREATH. 54 g 0 03/30/2016 at 0900  . aspirin EC 81 MG tablet Take 1 tablet (81 mg total) by mouth daily. 90 tablet 3 03/30/2016 at 0900  . atorvastatin (LIPITOR) 20 MG tablet Take 1 tablet (20 mg total) by mouth daily. 90 tablet 1 03/30/2016 at 0900  . hydrochlorothiazide (HYDRODIURIL) 25 MG tablet Take 1 tablet (25 mg total) by mouth daily. 30 tablet 3 03/30/2016 at 0900  . ibuprofen (ADVIL,MOTRIN) 800 MG tablet Take 1 tablet (800 mg total) by mouth every 8 (eight) hours as needed for moderate pain. 30 tablet 3 Past Week at Unknown time  . pantoprazole (PROTONIX) 40 MG tablet Take 1 tablet (40 mg total) by mouth daily. 90 tablet 1 03/30/2016 at 0900  . clopidogrel (PLAVIX) 75 MG tablet Take 1 tablet (75 mg total) by mouth daily. (Patient not taking: Reported on 03/31/2016) 90 tablet 3 Not Taking at Unknown time  . cyclobenzaprine (FLEXERIL) 10 MG tablet Take 1 tablet (10 mg total) by mouth 3 (three) times daily as needed for muscle spasms. (Patient not taking: Reported on 03/31/2016) 30 tablet 2 Not Taking at Unknown time  . nicotine (NICOTROL) 10 MG inhaler Inhale 1 cartridge (1 continuous puffing total) into the lungs as needed for smoking cessation (May inhale  contents of 6 to 12 cartridges per day, gradually decreasing over time.). (Patient not taking: Reported on 03/31/2016) 42 each 1 Not Taking at Unknown time   Scheduled: . aspirin EC  81 mg Oral Daily  . atorvastatin  20 mg Oral Daily  . clopidogrel  75 mg Oral Daily  . docusate sodium  100 mg Oral BID  . heparin  5,000 Units Subcutaneous Q8H  . hydrochlorothiazide  25 mg Oral Daily  . nicotine  21 mg Transdermal Daily  . pantoprazole  40 mg Oral Daily  . sodium chloride flush  3 mL Intravenous Q12H    ROS: History obtained from the patient  General ROS: negative for - chills, fatigue, fever, night  sweats, weight gain or weight loss Psychological ROS: negative for - behavioral disorder, hallucinations, memory difficulties, mood swings or suicidal ideation Ophthalmic ROS: negative for - blurry vision, double vision, eye pain or loss of vision ENT ROS: negative for - epistaxis, nasal discharge, oral lesions, sore throat, tinnitus or vertigo Allergy and Immunology ROS: negative for - hives or itchy/watery eyes Hematological and Lymphatic ROS: negative for - bleeding problems, bruising or swollen lymph nodes Endocrine ROS: negative for - galactorrhea, hair pattern changes, polydipsia/polyuria or temperature intolerance Respiratory ROS: negative for - cough, hemoptysis, shortness of breath or wheezing Cardiovascular ROS: negative for - chest pain, dyspnea on exertion, edema or irregular heartbeat Gastrointestinal ROS: negative for - abdominal pain, diarrhea, hematemesis, nausea/vomiting or stool incontinence Genito-Urinary ROS: negative for - dysuria, hematuria, incontinence or urinary frequency/urgency Musculoskeletal ROS: left arm and leg pain Neurological ROS: as noted in HPI Dermatological ROS: negative for rash and skin lesion changes  Physical Examination: Blood pressure 114/78, pulse 75, temperature 98.5 F (36.9 C), temperature source Oral, resp. rate 20, height 5\' 5"  (1.651 m), weight 89.8 kg (198 lb), SpO2 99 %.  HEENT-  Normocephalic, no lesions, without obvious abnormality.  Normal external eye and conjunctiva.  Normal TM's bilaterally.  Normal auditory canals and external ears. Normal external nose, mucus membranes and septum.  Normal pharynx. Cardiovascular- S1, S2 normal, pulses palpable throughout   Lungs- chest clear, no wheezing, rales, normal symmetric air entry Abdomen- soft, non-tender; bowel sounds normal; no masses,  no organomegaly Extremities- no edema Lymph-no adenopathy palpable Musculoskeletal-no joint tenderness, deformity or swelling Skin-warm and dry, no  hyperpigmentation, vitiligo, or suspicious lesions  Neurological Examination Mental Status: Alert, oriented, thought content appropriate.  Speech fluent without evidence of aphasia.  Able to follow 3 step commands without difficulty. Cranial Nerves: II: Discs flat bilaterally; Visual fields grossly normal, pupils equal, round, reactive to light and accommodation III,IV, VI: ptosis not present, extra-ocular motions intact bilaterally V,VII: smile symmetric, facial light touch sensation normal bilaterally VIII: hearing normal bilaterally IX,X: gag reflex present XI: bilateral shoulder shrug XII: midline tongue extension Motor: Right : Upper extremity   5/5    Left:     Upper extremity   4/5, drift without pronation  Lower extremity   5/5     Lower extremity   3-4/5 showing signs of decreased effort Tone and bulk:normal tone throughout; no atrophy noted Sensory: Pinprick and light touch decreased in the LUE Deep Tendon Reflexes: 2+ and symmetric throughout Plantars: Right: mute   Left: upgoing Cerebellar: Normal finger-to-nose and normal heel-to-shin testing bilaterally Gait: not tested due to safety concerns    Laboratory Studies:  Basic Metabolic Panel:  Recent Labs Lab 03/31/16 0028  NA 138  K 3.3*  CL 103  CO2 29  GLUCOSE 87  BUN 22*  CREATININE 1.01*  CALCIUM 9.2    Liver Function Tests: No results for input(s): AST, ALT, ALKPHOS, BILITOT, PROT, ALBUMIN in the last 168 hours. No results for input(s): LIPASE, AMYLASE in the last 168 hours. No results for input(s): AMMONIA in the last 168 hours.  CBC:  Recent Labs Lab 03/31/16 0028  WBC 8.8  NEUTROABS 3.7  HGB 12.7  HCT 36.8  MCV 84.4  PLT 272    Cardiac Enzymes:  Recent Labs Lab 03/31/16 0028  TROPONINI <0.03    BNP: Invalid input(s): POCBNP  CBG: No results for input(s): GLUCAP in the last 168 hours.  Microbiology: Results for orders placed or performed during the hospital encounter of  04/07/15  Urine culture     Status: None   Collection Time: 04/07/15  2:45 PM  Result Value Ref Range Status   Specimen Description URINE, RANDOM  Final   Special Requests NONE  Final   Culture INSIGNIFICANT GROWTH  Final   Report Status 04/09/2015 FINAL  Final    Coagulation Studies:  Recent Labs  03/31/16 0028  LABPROT 13.1  INR 0.99    Urinalysis:  Recent Labs Lab 03/31/16 0734  COLORURINE STRAW*  LABSPEC 1.015  PHURINE 5.0  GLUCOSEU NEGATIVE  HGBUR NEGATIVE  BILIRUBINUR NEGATIVE  KETONESUR NEGATIVE  PROTEINUR NEGATIVE  NITRITE NEGATIVE  LEUKOCYTESUR NEGATIVE    Lipid Panel:    Component Value Date/Time   CHOL 154 03/31/2016 0635   CHOL 192 02/20/2016 1826   TRIG 180 (H) 03/31/2016 0635   HDL 36 (L) 03/31/2016 0635   HDL 43 02/20/2016 1826   CHOLHDL 4.3 03/31/2016 0635   VLDL 36 03/31/2016 0635   LDLCALC 82 03/31/2016 0635   LDLCALC 122 (H) 02/20/2016 1826    HgbA1C:  Lab Results  Component Value Date   HGBA1C 6.1 (H) 08/22/2015    Urine Drug Screen:  No results found for: LABOPIA, COCAINSCRNUR, LABBENZ, AMPHETMU, THCU, LABBARB  Alcohol Level: No results for input(s): ETH in the last 168 hours.  Other results: EKG: sinus rhythm at 88 bpm.  Imaging: Dg Chest 2 View  Result Date: 03/31/2016 CLINICAL DATA:  Chest pain, dizziness, and weakness for 3 days. EXAM: CHEST  2 VIEW COMPARISON:  12/17/2015 FINDINGS: The heart size and mediastinal contours are within normal limits. Both lungs are clear. The visualized skeletal structures are unremarkable. IMPRESSION: No active cardiopulmonary disease. Electronically Signed   By: Lucienne Capers M.D.   On: 03/31/2016 01:00   Dg Pelvis 1-2 Views  Result Date: 03/31/2016 CLINICAL DATA:  Status post fall.  Left hip pain. EXAM: PELVIS - 1-2 VIEW COMPARISON:  None. FINDINGS: There is no evidence of pelvic fracture or diastasis. No pelvic bone lesions are seen. There is transitional lumbosacral anatomy with  suspected sacralization of the L5 vertebral body. IMPRESSION: No acute fracture of the pelvis. Electronically Signed   By: Ulyses Jarred M.D.   On: 03/31/2016 01:47   US Carotid Bilateral (at  [facility] Only)  Result Date: 03/31/2016 CLINICAL DATA:  50 year old female with a history of left-sided weakness EXAM: BILATERAL CAROTID DUPLEX ULTRASOUND TECHNIQUE: Pearline Cables scale imaging, color Doppler and duplex ultrasound were performed of bilateral carotid and vertebral arteries in the neck. COMPARISON:  04/08/2015 FINDINGS: Criteria: Quantification of carotid stenosis is based on velocity parameters that correlate the residual internal carotid diameter with NASCET-based stenosis levels, using the diameter of the distal internal carotid lumen as the denominator for stenosis measurement.  The following velocity measurements were obtained: RIGHT ICA:  Systolic 65 cm/sec, Diastolic 5 cm/sec CCA:  71 cm/sec SYSTOLIC ICA/CCA RATIO:  0.9 ECA:  73 cm/sec LEFT ICA:  Systolic 50 cm/sec, Diastolic 20 cm/sec CCA:  80 set cm/sec SYSTOLIC ICA/CCA RATIO:  0.6 ECA:  53 cm/sec Right Brachial SBP: Not acquired Left Brachial SBP: Not acquired RIGHT CAROTID ARTERY: No significant calcified disease of the right common carotid artery. Intermediate waveform maintained. Heterogeneous plaque without significant calcifications at the right carotid bifurcation. Low resistance waveform of the right ICA. No significant tortuosity. RIGHT VERTEBRAL ARTERY: Antegrade flow with low resistance waveform. LEFT CAROTID ARTERY: No significant calcified disease of the left common carotid artery. Intermediate waveform maintained. Heterogeneous plaque at the left carotid bifurcation without significant calcifications. Low resistance waveform of the left ICA. LEFT VERTEBRAL ARTERY:  Antegrade flow with low resistance waveform. IMPRESSION: Color duplex indicates minimal heterogeneous plaque, with no hemodynamically significant stenosis by duplex criteria in the  extracranial cerebrovascular circulation. Signed, Dulcy Fanny. Earleen Newport, DO [facility] Electronically Signed   By: Corrie Mckusick D.O.   On: 03/31/2016 13:13   Dg Knee Complete 4 Views Left  Result Date: 03/31/2016 CLINICAL DATA:  Left knee pain after a fall last night. EXAM: LEFT KNEE - COMPLETE 4+ VIEW COMPARISON:  None. FINDINGS: No evidence of fracture, dislocation, or joint effusion. No evidence of arthropathy or other focal bone abnormality. Soft tissues are unremarkable. IMPRESSION: Negative. Electronically Signed   By: Lucienne Capers M.D.   On: 03/31/2016 01:46   Dg Humerus Left  Result Date: 03/31/2016 CLINICAL DATA:  Left arm pain after a fall last night. EXAM: LEFT HUMERUS - 2+ VIEW COMPARISON:  None. FINDINGS: There is no evidence of fracture or other focal bone lesions. Soft tissues are unremarkable. IMPRESSION: Negative. Electronically Signed   By: Lucienne Capers M.D.   On: 03/31/2016 01:45   Ct Head Code Stroke W/o Cm  Result Date: 03/31/2016 CLINICAL DATA:  Code stroke.  Left-sided weakness EXAM: CT HEAD WITHOUT CONTRAST TECHNIQUE: Contiguous axial images were obtained from the base of the skull through the vertex without intravenous contrast. COMPARISON:  Brain MRI 04/08/2015 FINDINGS: Brain: No mass lesion, intraparenchymal hemorrhage or extra-axial collection. No evidence of acute cortical infarct. Brain parenchyma and CSF-containing spaces are normal for age. Vascular: No hyperdense vessel or unexpected calcification. Skull: Normal visualized skull base, calvarium and extracranial soft tissues. Sinuses/Orbits: No sinus fluid levels or advanced mucosal thickening. No mastoid effusion. Normal orbits. ASPECTS [facility] Stroke Program Early CT Score) - Ganglionic level infarction (caudate, lentiform nuclei, internal capsule, insula, M1-M3 cortex): 7 - Supraganglionic infarction (M4-M6 cortex): 3 Total score (0-10 with 10 being  normal): 10 IMPRESSION: 1. No acute intracranial abnormality.  Normal head CT. 2. ASPECTS is 10. These results were called by telephone at the time of interpretation on 03/31/2016 at 1:44 am to Dr. Larae Grooms , who verbally acknowledged these results. Electronically Signed   By: Ulyses Jarred M.D.   On: 03/31/2016 01:45    Assessment: 50 y.o. female presenting with increased left sided weakness and resultant fall.  Currently patient with weakness on exam and decreased effort that may be secondary to complaints of pain.  Head CT personally reviewed and shows no acute changes.  Can not rule out acute infarct or TIA at this time.  Seizure on the differential as well.  Patient on ASA at home.   Carotid dopplers show no evidence of hemodynamically significant  stenosis.  Echocardiogram shows no cardiac source of emboli with an EF of 60-65%.  A1c pending, LDL 82.  Stroke Risk Factors - hyperlipidemia and hypertension  Plan: 1. MRI, MRA  of the brain without contrast 2. PT consult, OT consult, Speech consult 3. Prophylactic therapy-ASA and Plavix 4. NPO until RN stroke swallow screen 5. Telemetry monitoring 6. Frequent neuro checks 7. Ultram 50mg  q 8 hours prn for pain 8. EEG.  May be performed as an outpatient if necessary.   9. Aggressive lipid management with target LDL<70.   Alexis Goodell, MD Neurology [phone] 03/31/2016, 2:32 PM

## 2016-03-31 NOTE — ED Notes (Signed)
SOC neuro complete att

## 2016-03-31 NOTE — ED Notes (Signed)
Tele SOC to room

## 2016-03-31 NOTE — ED Triage Notes (Signed)
Patient presents to Emergency Department via EMS with complaints of fall, 10/10 pain at left shoulder, left hip and left ankle.  Pt reports LOC after falling and reports falling d/t weakness.  Per EMS pt c/o of chest pain while en route to ED.  Pt reports central chest pain that feels like "pins stabbing" 8/10 with no SOB or sweats but dizziness and weakness for the last 3 days.  Pt now reports left hand numbness.  Pt has history of TIA and is poor historian.

## 2016-03-31 NOTE — Evaluation (Signed)
Physical Therapy Evaluation Patient Details Name: Jocelyn Simon MRN: [id number] DOB: 1966/01/22 Today's Date: 03/31/2016   History of Present Illness  Pt admitted for possible CVA. Pt with MRI pending. Pt with complaints of L side weakness and is now s/p fall.  Imaging negative at this time.  Clinical Impression  Pt is a pleasant 50 year old femlae who was admitted for possible CVA. Pt performs bed mobility with mod I, transfers with cga, and ambulation with cga and RW. Pt demonstrates deficits with strength/balance/mobility. Pt with decreased coordination noted with RAMPs with L UE/LE. Pt also reports decreased sensation in L foot with light touch. Pt is currently not at baseline level. MRI still pending. Would benefit from skilled PT to address above deficits and promote optimal return to PLOF. Recommend transition to HHPT upon discharge from acute hospitalization.       Follow Up Recommendations Home health PT;Supervision for mobility/OOB    Equipment Recommendations  Rolling walker with 5" wheels    Recommendations for Other Services       Precautions / Restrictions Precautions Precautions: Fall Restrictions Weight Bearing Restrictions: No      Mobility  Bed Mobility Overal bed mobility: Modified Independent             General bed mobility comments: safe technique performed  Transfers Overall transfer level: Needs assistance Equipment used: Rolling walker (2 wheeled) Transfers: Sit to/from Stand Sit to Stand: Min guard         General transfer comment: Cues for pushing off bed surface rather than pulling up on RW. Once standing, pt reports dizziness and demonstrates post leaning, able to correct with cues. On 2nd attempt, pt able to stand with increased ease, however reports she is still dizzy  Ambulation/Gait Ambulation/Gait assistance: Min guard Ambulation Distance (Feet): 3 Feet Assistive device: Rolling walker (2 wheeled) Gait Pattern/deviations:  Step-to pattern     General Gait Details: started ambulation towards bathroom, however secondary to dizziness, pt sat back down and [facility]. Safe technique performed. Further mobility not performed this date as RN in room for nausea meds and to start IV  Stairs            Wheelchair Mobility    Modified Rankin (Stroke Patients Only)       Balance Overall balance assessment: History of Falls;Needs assistance Sitting-balance support: Feet supported;Bilateral upper extremity supported Sitting balance-Leahy Scale: Good     Standing balance support: Bilateral upper extremity supported Standing balance-Leahy Scale: Fair Standing balance comment: increased sway noted in post direction                             Pertinent Vitals/Pain Pain Assessment: No/denies pain (complains of nausea-RN notified)    Home Living Family/patient expects to be discharged to:: Private residence Living Arrangements: Spouse/significant other Available Help at Discharge: Family;Available 24 hours/day Type of Home: House Home Access: Level entry     Home Layout: One level Home Equipment: None      Prior Function Level of Independence: Independent         Comments: household ambulator     Hand Dominance        Extremity/Trunk Assessment   Upper Extremity Assessment: Generalized weakness (L UE grossly 4-/5; R UE grossly 5/5)           Lower Extremity Assessment: Generalized weakness (L LE grossly 4/5; R LE grossly 5/5 with decreased  sensation)         Communication   Communication: No difficulties  Cognition Arousal/Alertness: Awake/alert Behavior During Therapy: WFL for tasks assessed/performed Overall Cognitive Status: Within Functional Limits for tasks assessed                      General Comments      Exercises Other Exercises Other Exercises: ambulated to [facility] with cga. Pt able to perform self hygiene with  supervision. Safe technique with weight shift and standing in order to clean body.   Assessment/Plan    PT Assessment Patient needs continued PT services  PT Problem List Decreased strength;Decreased balance;Decreased mobility          PT Treatment Interventions Gait training;Therapeutic exercise    PT Goals (Current goals can be found in the Care Plan section)  Acute Rehab PT Goals Patient Stated Goal: to get stronger PT Goal Formulation: With patient Time For Goal Achievement: 04/14/16 Potential to Achieve Goals: Good    Frequency 7X/week   Barriers to discharge        Co-evaluation               End of Session Equipment Utilized During Treatment: Gait belt Activity Tolerance: Patient tolerated treatment well Patient left: in bed;with nursing/sitter in room Nurse Communication: Mobility status    Functional Assessment Tool Used: clinical judgement Functional Limitation: Mobility: Walking and moving around Mobility: Walking and Moving Around Current Status [id number]): At least 20 percent but less than 40 percent impaired, limited or restricted Mobility: Walking and Moving Around Goal Status [id number]): At least 1 percent but less than 20 percent impaired, limited or restricted    Time: [id number] PT Time Calculation (min) (ACUTE ONLY): 17 min   Charges:   PT Evaluation $PT Eval Moderate Complexity: 1 Procedure PT Treatments $Therapeutic Activity: 8-22 mins   PT G Codes:   PT G-Codes **NOT FOR INPATIENT CLASS** Functional Assessment Tool Used: clinical judgement Functional Limitation: Mobility: Walking and moving around Mobility: Walking and Moving Around Current Status [id number]): At least 20 percent but less than 40 percent impaired, limited or restricted Mobility: Walking and Moving Around Goal Status [phone]): At least 1 percent but less than 20 percent impaired, limited or restricted    Vern Prestia 03/31/2016, 4:00 PM  Greggory Stallion, PT,  DPT [phone]

## 2016-03-31 NOTE — Progress Notes (Signed)
*  PRELIMINARY RESULTS* Echocardiogram 2D Echocardiogram has been performed.  Jocelyn Simon 03/31/2016, 9:20 AM

## 2016-03-31 NOTE — ED Notes (Signed)
Pt reports that she quit taking plavix 2 or 3 weeks ago because it was making her nauseous

## 2016-04-01 ENCOUNTER — Observation Stay: Payer: Medicaid Other

## 2016-04-01 ENCOUNTER — Encounter: Payer: Self-pay | Admitting: Pharmacist

## 2016-04-01 LAB — HEMOGLOBIN A1C
Hgb A1c MFr Bld: 6 % — ABNORMAL HIGH (ref 4.8–5.6)
Mean Plasma Glucose: 126 mg/dL

## 2016-04-01 MED ORDER — CLOPIDOGREL BISULFATE 75 MG PO TABS: 75.0000 mg | ORAL_TABLET | Freq: Every day | ORAL | 3 refills | Status: DC

## 2016-04-01 MED ORDER — ONDANSETRON HCL 4 MG PO TABS: 4.0000 mg | ORAL_TABLET | Freq: Three times a day (TID) | ORAL | 0 refills | Status: DC | PRN

## 2016-04-01 NOTE — Care Management Note (Signed)
Case Management Note  Patient Details  Name: Jocelyn Simon MRN: [id number] Date of Birth: Aug 05, 1965  Subjective/Objective:      Text to Edward Qualia at [facility] to please evaluate uninsured Ms Wickert for HH-PT and a RW.               Action/Plan:   Expected Discharge Date:                  Expected Discharge Plan:     In-House Referral:     Discharge planning Services     Post Acute Care Choice:    Choice offered to:     DME Arranged:    DME Agency:     HH Arranged:    HH Agency:     Status of Service:     If discussed at [employer] of Stay Meetings, dates discussed:    Additional Comments:  Cherae Marton A, RN 04/01/2016, 11:41 AM

## 2016-04-01 NOTE — Discharge Summary (Signed)
[facility] NAME: Jocelyn Simon    MR#:  [id number]  DATE OF BIRTH:  [birth year]  DATE OF ADMISSION:  03/31/2016 ADMITTING PHYSICIAN: Harrie Foreman, MD  DATE OF DISCHARGE: 04/01/2016  PRIMARY CARE PHYSICIAN: No primary care provider on file.    ADMISSION DIAGNOSIS:  Contusion of left hip, initial encounter [S70.02XA] Cerebrovascular accident (CVA), unspecified mechanism ([place]) [I63.9]  DISCHARGE DIAGNOSIS:  Active Problems:   Left-sided weakness   SECONDARY DIAGNOSIS:   Past Medical History:  Diagnosis Date  . Asthma   . GERD (gastroesophageal reflux disease)   . Headache   . Hyperlipidemia   . Hypertension   . Stroke [facility])     HOSPITAL COURSE:   50 year old female with past medical history of hypertension, hyperlipidemia, history of previous CVA, GERD, asthma sent to the hospital after a mechanical fall earlier this morning and also noted to have left-sided weakness.   1. Left-sided weakness-Patient was admitted to the hospital working diagnosis is CVA. -She underwent extensive workup including CT head, MRI brain, carotid duplex which showed no evidence of acute abnormalities. Her weakness and clinical symptoms have not improved and resolved. -She will continue aspirin and Plavix and statin and follow up with her primary care physician as an outpatient. She was seen by neurology who agreed with this management..  2. Hyperlipidemia- she will continue atorvastatin.  3. Essential hypertension-hemodynamically stable. She will Continue HCTZ  4. GERD - pt. Will cont. Protonix.    DISCHARGE CONDITIONS:   Stable.   CONSULTS OBTAINED:  Treatment Team:  Alexis Goodell, MD  DRUG ALLERGIES:  No Known Allergies  DISCHARGE MEDICATIONS:     Medication List    STOP taking these medications   nicotine 10 MG inhaler Commonly known as:  NICOTROL     TAKE these medications   acetaminophen 325 MG  tablet Commonly known as:  TYLENOL Take 2 tablets (650 mg total) by mouth every 6 (six) hours as needed for moderate pain (headache).   albuterol 108 (90 Base) MCG/ACT inhaler Commonly known as:  VENTOLIN HFA INHALE 2 PUFFS EVERY SIX HOURS AS NEEDED FOR COUGH, WHEEZING OR SHORTNESS OF BREATH.   aspirin EC 81 MG tablet Take 1 tablet (81 mg total) by mouth daily.   atorvastatin 20 MG tablet Commonly known as:  LIPITOR Take 1 tablet (20 mg total) by mouth daily.   clopidogrel 75 MG tablet Commonly known as:  PLAVIX Take 1 tablet (75 mg total) by mouth daily.   cyclobenzaprine 10 MG tablet Commonly known as:  FLEXERIL Take 1 tablet (10 mg total) by mouth 3 (three) times daily as needed for muscle spasms.   hydrochlorothiazide 25 MG tablet Commonly known as:  HYDRODIURIL Take 1 tablet (25 mg total) by mouth daily.   ibuprofen 800 MG tablet Commonly known as:  ADVIL,MOTRIN Take 1 tablet (800 mg total) by mouth every 8 (eight) hours as needed for moderate pain.   ondansetron 4 MG tablet Commonly known as:  ZOFRAN Take 1 tablet (4 mg total) by mouth every 8 (eight) hours as needed for nausea or vomiting.   pantoprazole 40 MG tablet Commonly known as:  PROTONIX Take 1 tablet (40 mg total) by mouth daily.         DISCHARGE INSTRUCTIONS:   DIET:  Cardiac diet  DISCHARGE CONDITION:  Stable  ACTIVITY:  Activity as tolerated  OXYGEN:  Home Oxygen: No.   Oxygen Delivery: room air  DISCHARGE LOCATION:  home with Home Health PT  If you experience worsening of your admission symptoms, develop shortness of breath, life threatening emergency, suicidal or homicidal thoughts you must seek medical attention immediately by calling 911 or calling your MD immediately  if symptoms less severe.  You Must read complete instructions/literature along with all the possible adverse reactions/side effects for all the Medicines you take and that have been prescribed to you. Take any new  Medicines after you have completely understood and accpet all the possible adverse reactions/side effects.   Please note  You were cared for by a hospitalist during your hospital stay. If you have any questions about your discharge medications or the care you received while you were in the hospital after you are discharged, you can call the unit and asked to speak with the hospitalist on call if the hospitalist that took care of you is not available. Once you are discharged, your primary care physician will handle any further medical issues. Please note that NO REFILLS for any discharge medications will be authorized once you are discharged, as it is imperative that you return to your primary care physician (or establish a relationship with a primary care physician if you do not have one) for your aftercare needs so that they can reassess your need for medications and monitor your lab values.     Today   Left sided weakness much improved. No other complaints.  Seen by PT and they recommend home health services.   VITAL SIGNS:  Blood pressure 100/74, pulse 70, temperature 97.7 F (36.5 C), temperature source Oral, resp. rate 20, height 5\' 5"  (1.651 m), weight 89.8 kg (198 lb), SpO2 97 %.  I/O:   Intake/Output Summary (Last 24 hours) at 04/01/16 1428 Last data filed at 04/01/16 1259  Gross per 24 hour  Intake             3900 ml  Output              925 ml  Net             2975 ml    PHYSICAL EXAMINATION:  GENERAL:  50 y.o.-year-old patient lying in the bed with no acute distress.  EYES: Pupils equal, round, reactive to light and accommodation. No scleral icterus. Extraocular muscles intact.  HEENT: Head atraumatic, normocephalic. Oropharynx and nasopharynx clear.  NECK:  Supple, no jugular venous distention. No thyroid enlargement, no tenderness.  LUNGS: Normal breath sounds bilaterally, no wheezing, rales,rhonchi. No use of accessory muscles of respiration.  CARDIOVASCULAR: S1, S2  normal. No murmurs, rubs, or gallops.  ABDOMEN: Soft, non-tender, non-distended. Bowel sounds present. No organomegaly or mass.  EXTREMITIES: No pedal edema, cyanosis, or clubbing.  NEUROLOGIC: Cranial nerves II through XII are intact. No focal motor or sensory defecits b/l. Minimal left sided weakness.  PSYCHIATRIC: The patient is alert and oriented x 3. Good affect.  SKIN: No obvious rash, lesion, or ulcer.   DATA REVIEW:   CBC  Recent Labs Lab 03/31/16 0028  WBC 8.8  HGB 12.7  HCT 36.8  PLT 272    Chemistries   Recent Labs Lab 03/31/16 0028  NA 138  K 3.3*  CL 103  CO2 29  GLUCOSE 87  BUN 22*  CREATININE 1.01*  CALCIUM 9.2    Cardiac Enzymes  Recent Labs Lab 03/31/16 0028  TROPONINI <0.03    Microbiology Results  Results for orders placed or performed during the hospital encounter of 04/07/15  Urine culture  Status: None   Collection Time: 04/07/15  2:45 PM  Result Value Ref Range Status   Specimen Description URINE, RANDOM  Final   Special Requests NONE  Final   Culture INSIGNIFICANT GROWTH  Final   Report Status 04/09/2015 FINAL  Final    RADIOLOGY:  Dg Chest 2 View  Result Date: 03/31/2016 CLINICAL DATA:  Chest pain, dizziness, and weakness for 3 days. EXAM: CHEST  2 VIEW COMPARISON:  12/17/2015 FINDINGS: The heart size and mediastinal contours are within normal limits. Both lungs are clear. The visualized skeletal structures are unremarkable. IMPRESSION: No active cardiopulmonary disease. Electronically Signed   By: Lucienne Capers M.D.   On: 03/31/2016 01:00   Dg Pelvis 1-2 Views  Result Date: 03/31/2016 CLINICAL DATA:  Status post fall.  Left hip pain. EXAM: PELVIS - 1-2 VIEW COMPARISON:  None. FINDINGS: There is no evidence of pelvic fracture or diastasis. No pelvic bone lesions are seen. There is transitional lumbosacral anatomy with suspected sacralization of the L5 vertebral body. IMPRESSION: No acute fracture of the pelvis. Electronically  Signed   By: Ulyses Jarred M.D.   On: 03/31/2016 01:47   Mr Brain Wo Contrast  Result Date: 04/01/2016 CLINICAL DATA:  Fall.  Left-sided weakness. EXAM: MRI HEAD WITHOUT CONTRAST TECHNIQUE: Multiplanar, multiecho pulse sequences of the brain and surrounding structures were obtained without intravenous contrast. COMPARISON:  CT 03/31/2016.  MRI 04/08/2015 FINDINGS: Brain: Ventricle size normal. Cerebral volume normal. Negative for acute or chronic infarction. Negative for demyelinating disease. Negative for hemorrhage or fluid collection. Negative for mass or edema. No shift of the midline structures. Vascular: Normal arterial flow voids. Skull and upper cervical spine:  negative Sinuses/Orbits: Mild mucosal edema in the paranasal sinuses as noted previously. Small left mastoid effusion improved. Normal orbital contents Other: Negative IMPRESSION: Normal MRI of the brain Mild sinus mucosal disease Electronically Signed   By: Franchot Gallo M.D.   On: 04/01/2016 11:10   US Carotid Bilateral (at  [facility])  Result Date: 03/31/2016 CLINICAL DATA:  50 year old female with a history of left-sided weakness EXAM: BILATERAL CAROTID DUPLEX ULTRASOUND TECHNIQUE: Pearline Cables scale imaging, color Doppler and duplex ultrasound were performed of bilateral carotid and vertebral arteries in the neck. COMPARISON:  04/08/2015 FINDINGS: Criteria: Quantification of carotid stenosis is based on velocity parameters that correlate the residual internal carotid diameter with NASCET-based stenosis levels, using the diameter of the distal internal carotid lumen as the denominator for stenosis measurement. The following velocity measurements were obtained: RIGHT ICA:  Systolic 65 cm/sec, Diastolic 5 cm/sec CCA:  71 cm/sec SYSTOLIC ICA/CCA RATIO:  0.9 ECA:  73 cm/sec LEFT ICA:  Systolic 50 cm/sec, Diastolic 20 cm/sec CCA:  80 set cm/sec SYSTOLIC ICA/CCA RATIO:  0.6 ECA:  53 cm/sec Right Brachial SBP: Not acquired Left Brachial SBP: Not  acquired RIGHT CAROTID ARTERY: No significant calcified disease of the right common carotid artery. Intermediate waveform maintained. Heterogeneous plaque without significant calcifications at the right carotid bifurcation. Low resistance waveform of the right ICA. No significant tortuosity. RIGHT VERTEBRAL ARTERY: Antegrade flow with low resistance waveform. LEFT CAROTID ARTERY: No significant calcified disease of the left common carotid artery. Intermediate waveform maintained. Heterogeneous plaque at the left carotid bifurcation without significant calcifications. Low resistance waveform of the left ICA. LEFT VERTEBRAL ARTERY:  Antegrade flow with low resistance waveform. IMPRESSION: Color duplex indicates minimal heterogeneous plaque, with no hemodynamically significant stenosis by duplex criteria in the extracranial cerebrovascular circulation. Signed, Dulcy Fanny. Earleen Newport, DO  [facility] Electronically Signed   By: Corrie Mckusick D.O.   On: 03/31/2016 13:13   Dg Knee Complete 4 Views Left  Result Date: 03/31/2016 CLINICAL DATA:  Left knee pain after a fall last night. EXAM: LEFT KNEE - COMPLETE 4+ VIEW COMPARISON:  None. FINDINGS: No evidence of fracture, dislocation, or joint effusion. No evidence of arthropathy or other focal bone abnormality. Soft tissues are unremarkable. IMPRESSION: Negative. Electronically Signed   By: Lucienne Capers M.D.   On: 03/31/2016 01:46   Dg Humerus Left  Result Date: 03/31/2016 CLINICAL DATA:  Left arm pain after a fall last night. EXAM: LEFT HUMERUS - 2+ VIEW COMPARISON:  None. FINDINGS: There is no evidence of fracture or other focal bone lesions. Soft tissues are unremarkable. IMPRESSION: Negative. Electronically Signed   By: Lucienne Capers M.D.   On: 03/31/2016 01:45   Ct Head Code Stroke W/o Cm  Result Date: 03/31/2016 CLINICAL DATA:  Code stroke.  Left-sided weakness EXAM: CT HEAD WITHOUT CONTRAST  TECHNIQUE: Contiguous axial images were obtained from the base of the skull through the vertex without intravenous contrast. COMPARISON:  Brain MRI 04/08/2015 FINDINGS: Brain: No mass lesion, intraparenchymal hemorrhage or extra-axial collection. No evidence of acute cortical infarct. Brain parenchyma and CSF-containing spaces are normal for age. Vascular: No hyperdense vessel or unexpected calcification. Skull: Normal visualized skull base, calvarium and extracranial soft tissues. Sinuses/Orbits: No sinus fluid levels or advanced mucosal thickening. No mastoid effusion. Normal orbits. ASPECTS [facility] Stroke Program Early CT Score) - Ganglionic level infarction (caudate, lentiform nuclei, internal capsule, insula, M1-M3 cortex): 7 - Supraganglionic infarction (M4-M6 cortex): 3 Total score (0-10 with 10 being normal): 10 IMPRESSION: 1. No acute intracranial abnormality.  Normal head CT. 2. ASPECTS is 10. These results were called by telephone at the time of interpretation on 03/31/2016 at 1:44 am to Dr. Larae Grooms , who verbally acknowledged these results. Electronically Signed   By: Ulyses Jarred M.D.   On: 03/31/2016 01:45      Management plans discussed with the patient, family and they are in agreement.  CODE STATUS:     Code Status Orders        Start     Ordered   03/31/16 0536  Full code  Continuous     03/31/16 0535    Code Status History    Date Active Date Inactive Code Status Order ID Comments User Context   12/20/2015  8:06 PM 12/22/2015  6:02 PM Full Code [id number]  Benjaman Kindler, MD Inpatient   12/20/2015  8:06 PM 12/20/2015  8:06 PM Full Code [id number]  Benjaman Kindler, MD Inpatient   04/07/2015  6:26 PM 04/10/2015  3:26 PM Full Code [id number]  Aldean Jewett, MD Inpatient      TOTAL TIME TAKING CARE OF THIS PATIENT: 40 minutes.    Henreitta Leber M.D on 04/01/2016 at 2:28 PM  Between 7am to 6pm - Pager - [phone]  After 6pm go to [email or web address] - Clinical research associate [facility]  Office  [phone]  CC: Primary care physician; No primary care provider on file.

## 2016-04-01 NOTE — Progress Notes (Signed)
PT Cancellation Note  Patient Details Name: Jocelyn Simon MRN: [id number] DOB: 22-Aug-1965   Cancelled Treatment:     PT treatment attempted with pt out of room for imaging.   Blanche East Jakobi Thetford 04/01/2016, 11:42 AM

## 2016-04-01 NOTE — Progress Notes (Signed)
Provided a Pastoral presence and prayed for the family. Chaplain Mat Dent Plantz    04/01/16 1215  Clinical Encounter Type  Visited With Patient and family together  Visit Type Spiritual support;Initial  Referral From Nurse  Consult/Referral To Chaplain  Spiritual Encounters  Spiritual Needs Prayer;Emotional

## 2016-04-04 ENCOUNTER — Ambulatory Visit: Payer: Self-pay | Admitting: Pharmacy Technician

## 2016-04-04 DIAGNOSIS — Z79899 Other long term (current) drug therapy: Secondary | ICD-10-CM

## 2016-04-04 NOTE — Progress Notes (Signed)
Met with patient to recertify for [facility] program.  Patient to provide notarized letter of support.  Provided patient with community resource material based on her particular needs.    Ventolin Prescription Application completed with patient.  Forwarded to Dr. Staci Acosta at [facility] for signature.  Upon receipt of signed application from provider and proof of income from patient, Ventolin Prescription Application will be submitted to [place].  [facility]

## 2016-04-15 ENCOUNTER — Encounter: Payer: Self-pay | Admitting: Pharmacist

## 2016-05-09 ENCOUNTER — Ambulatory Visit: Payer: Self-pay

## 2016-06-06 ENCOUNTER — Encounter: Payer: Self-pay | Admitting: Pharmacist

## 2016-06-26 ENCOUNTER — Telehealth: Payer: Self-pay | Admitting: Pharmacist

## 2016-06-26 NOTE — Telephone Encounter (Signed)
Ventolin HFA refill PAP submitted to manufacturer today. °

## 2016-06-27 ENCOUNTER — Encounter: Payer: Self-pay | Admitting: Pharmacist

## 2016-07-15 ENCOUNTER — Encounter: Payer: Self-pay | Admitting: Pharmacist

## 2016-07-15 ENCOUNTER — Other Ambulatory Visit: Payer: Self-pay | Admitting: Internal Medicine

## 2016-07-15 ENCOUNTER — Encounter (INDEPENDENT_AMBULATORY_CARE_PROVIDER_SITE_OTHER): Payer: Self-pay

## 2016-07-15 ENCOUNTER — Other Ambulatory Visit: Payer: Self-pay | Admitting: Nurse Practitioner

## 2016-07-15 ENCOUNTER — Ambulatory Visit: Payer: Self-pay | Admitting: Pharmacist

## 2016-07-15 VITALS — BP 140/90 | Ht 61.0 in | Wt 190.0 lb

## 2016-07-15 DIAGNOSIS — Z79899 Other long term (current) drug therapy: Secondary | ICD-10-CM

## 2016-07-15 NOTE — Patient Instructions (Addendum)
Exercise: start with 5 - 10 minutes per day as tolerated. Increase over a few weeks. Goal is 150 minutes of cardio exercise per week.  Diet: limit pork and fried foods, consider [place] bacon. Increase vegetables and fruit (moderation).   Blood Sugar Check: Check blood sugar first thing in the morning, 2-3 times per week. Goal for morning blood sugar is 80-130 mg/dl.Check more if blood sugar is over 130 mg/dl in the morning. If you check in the afternoon, check 2 hours after a meal. Goal for afternoon blood sugar is less than 180 mg/dl.  Pharmacist to contact [facility] about follow up appointment and RX for Nicotrol and referral for foot pain, mammogram, racing heart rate, colononoscopy     Blood Glucose Monitoring, Adult Monitoring your blood sugar (glucose) helps you manage your diabetes. It also helps you and your health care provider determine how well your diabetes management plan is working. Blood glucose monitoring involves checking your blood glucose as often as directed, and keeping a record (log) of your results over time. Why should I monitor my blood glucose? Checking your blood glucose regularly can:  Help you understand how food, exercise, illnesses, and medicines affect your blood glucose.  Let you know what your blood glucose is at any time. You can quickly tell if you are having low blood glucose (hypoglycemia) or high blood glucose (hyperglycemia).  Help you and your health care provider adjust your medicines as needed. When should I check my blood glucose? Follow instructions from your health care provider about how often to check your blood glucose. This may depend on:  The type of diabetes you have.  How well-controlled your diabetes is.  Medicines you are taking. If you have type 2 diabetes:  Also check your blood glucose:  Before and after exercise.  Before potentially dangerous tasks, like driving or using heavy machinery.  You may need to check your  blood glucose more often if:  Your medicine is being adjusted.  Your diabetes is not well-controlled.  You are ill. What is a blood glucose log?  A blood glucose log is a record of your blood glucose readings. It helps you and your health care provider:  Look for patterns in your blood glucose over time.  Adjust your diabetes management plan as needed.  Every time you check your blood glucose, write down your result and notes about things that may be affecting your blood glucose, such as your diet and exercise for the day.  Most glucose meters store a record of glucose readings in the meter. Some meters allow you to download your records to a computer. How do I check my blood glucose? Follow these steps to get accurate readings of your blood glucose: Supplies needed   Blood glucose meter.  Test strips for your meter. Each meter has its own strips. You must use the strips that come with your meter.  A needle to prick your finger (lancet). Do not use lancets more than once.  A device that holds the lancet (lancing device).  A journal or log book to write down your results. Procedure  Wash your hands with soap and water.  Prick the side of your finger (not the tip) with the lancet. Use a different finger each time.  Gently rub the finger until a small drop of blood appears.  Follow instructions that come with your meter for inserting the test strip, applying blood to the strip, and using your blood glucose meter.  Write  down your result and any notes. Alternative testing sites  Some meters allow you to use areas of your body other than your finger (alternative sites) to test your blood.  If you think you may have hypoglycemia, or if you have hypoglycemia unawareness, do not use alternative sites. Use your finger instead.  Alternative sites may not be as accurate as the fingers, because blood flow is slower in these areas. This means that the result you get may be delayed,  and it may be different from the result that you would get from your finger.  The most common alternative sites are:  Forearm.  Thigh.  Palm of the hand. Additional tips  Always keep your supplies with you.  If you have questions or need help, all blood glucose meters have a 24-hour "hotline" number that you can call. You may also contact your health care provider.  After you use a few boxes of test strips, adjust (calibrate) your blood glucose meter by following instructions that came with your meter. This information is not intended to replace advice given to you by your health care provider. Make sure you discuss any questions you have with your health care provider. Document Released: 06/13/2003 Document Revised: 12/29/2015 Document Reviewed: 11/20/2015 Elsevier Interactive Patient Education  2017 [employer]. py and complete physical exam at [facility].    Diabetes Mellitus and Food It is important for you to manage your blood sugar (glucose) level. Your blood glucose level can be greatly affected by what you eat. Eating healthier foods in the appropriate amounts throughout the day at about the same time each day will help you control your blood glucose level. It can also help slow or prevent worsening of your diabetes mellitus. Healthy eating may even help you improve the level of your blood pressure and reach or maintain a healthy weight. General recommendations for healthful eating and cooking habits include:  Eating meals and snacks regularly. Avoid going long periods of time without eating to lose weight.  Eating a diet that consists mainly of plant-based foods, such as fruits, vegetables, nuts, legumes, and whole grains.  Using low-heat cooking methods, such as baking, instead of high-heat cooking methods, such as deep frying. Work with your dietitian to make sure you understand how to use the Nutrition Facts information on food labels. How can food affect me? Carbohydrates   Carbohydrates affect your blood glucose level more than any other type of food. Your dietitian will help you determine how many carbohydrates to eat at each meal and teach you how to count carbohydrates. Counting carbohydrates is important to keep your blood glucose at a healthy level, especially if you are using insulin or taking certain medicines for diabetes mellitus. Alcohol  Alcohol can cause sudden decreases in blood glucose (hypoglycemia), especially if you use insulin or take certain medicines for diabetes mellitus. Hypoglycemia can be a life-threatening condition. Symptoms of hypoglycemia (sleepiness, dizziness, and disorientation) are similar to symptoms of having too much alcohol. If your health care provider has given you approval to drink alcohol, do so in moderation and use the following guidelines:  Women should not have more than one drink per day, and men should not have more than two drinks per day. One drink is equal to:  12 oz of beer.  5 oz of wine.  1 oz of hard liquor.  Do not drink on an empty stomach.  Keep yourself hydrated. Have water, diet soda, or unsweetened iced tea.  Regular soda, juice, and  other mixers might contain a lot of carbohydrates and should be counted. What foods are not recommended? As you make food choices, it is important to remember that all foods are not the same. Some foods have fewer nutrients per serving than other foods, even though they might have the same number of calories or carbohydrates. It is difficult to get your body what it needs when you eat foods with fewer nutrients. Examples of foods that you should avoid that are high in calories and carbohydrates but low in nutrients include:  Trans fats (most processed foods list trans fats on the Nutrition Facts label).  Regular soda.  Juice.  Candy.  Sweets, such as cake, pie, doughnuts, and cookies.  Fried foods. What foods can I eat? Eat nutrient-rich foods, which will  nourish your body and keep you healthy. The food you should eat also will depend on several factors, including:  The calories you need.  The medicines you take.  Your weight.  Your blood glucose level.  Your blood pressure level.  Your cholesterol level. You should eat a variety of foods, including:  Protein.  Lean cuts of meat.  Proteins low in saturated fats, such as fish, egg whites, and beans. Avoid processed meats.  Fruits and vegetables.  Fruits and vegetables that may help control blood glucose levels, such as apples, mangoes, and yams.  Dairy products.  Choose fat-free or low-fat dairy products, such as milk, yogurt, and cheese.  Grains, bread, pasta, and rice.  Choose whole grain products, such as multigrain bread, whole oats, and brown rice. These foods may help control blood pressure.  Fats.  Foods containing healthful fats, such as nuts, avocado, olive oil, canola oil, and fish.  Does everyone with diabetes mellitus have the same meal plan? Because every person with diabetes mellitus is different, there is not one meal plan that works for everyone. It is very important that you meet with a dietitian who will help you create a meal plan that is just right for you. This information is not intended to replace advice given to you by your health care provider. Make sure you discuss any questions you have with your health care provider. Document Released: 03/07/2005 Document Revised: 11/16/2015 Document Reviewed: 05/07/2013 Elsevier Interactive Patient Education  2017 [employer].

## 2016-07-15 NOTE — Progress Notes (Signed)
[facility] Visit Note  Patient: Jocelyn Simon MRN: [id number] Date of Birth: 30-Jul-1965 PCP: No primary care provider on file.   Jocelyn Simon 51 y.o. female presents for an annual medication review visit today. She is interested in a referral to [facility] for several health issues. She has been approved for [facility]. She has asked that I contact [facility] for an appointment and assessment of referrals.   BP 140/90 (BP Location: Right Arm, Patient Position: Sitting, Cuff Size: Large)   Ht 5\' 1"  (1.549 m)   Wt 190 lb (86.2 kg)   BMI 35.90 kg/m   Patient Information   Past Medical History:  Diagnosis Date  . Asthma   . GERD (gastroesophageal reflux disease)   . Headache   . Hyperlipidemia   . Hypertension   . Stroke ([place])   . Vertigo       Past Surgical History:  Procedure Laterality Date  . LAPAROSCOPIC SALPINGO OOPHERECTOMY Bilateral 12/20/2015   Procedure: LAPAROSCOPIC BILATERAL SALPINGO OOPHORECTOMY WITH PELVIC WASHINGS;  Surgeon: Benjaman Kindler, MD;  Location: [facility] ORS;  Service: Gynecology;  Laterality: Bilateral;     Family History  Problem Relation Age of Onset  . CAD Mother   . CAD Sister   . CVA Other     New Diagnoses (since last visit):   Family Support: Good  Lifestyle Diet: Breakfast:  Berniece Salines and eggs Lunch: Varies Dinner: Fried chicken, mashed potato, greens Drinks: water, occasional Ginger Ale    Current Exercise Habits: The patient does not participate in regular exercise at present  Exercise limited by: None identified    History  Alcohol Use No      History  Smoking Status  . Current Every Day Smoker  . Packs/day: 0.25  . Types: Cigarettes  Smokeless Tobacco  . Never Used    Comment: interested in restarting Nicotrol inhalers       Health Maintenance  Topic Date Due  . HIV Screening  06/03/1980  . TETANUS/TDAP  06/03/1984  . PAP SMEAR  06/03/1986  . MAMMOGRAM  06/04/2015  .  COLONOSCOPY  06/04/2015  . INFLUENZA VACCINE  01/23/2016   Prior to Admission medications   Medication Sig Start Date End Date Taking? Authorizing Provider  albuterol (VENTOLIN HFA) 108 (90 Base) MCG/ACT inhaler INHALE 2 PUFFS EVERY SIX HOURS AS NEEDED FOR COUGH, WHEEZING OR SHORTNESS OF BREATH. 02/15/16  Yes Juluis Pitch, MD  aspirin EC 81 MG tablet Take 1 tablet (81 mg total) by mouth daily. 02/15/16  Yes Juluis Pitch, MD  atorvastatin (LIPITOR) 20 MG tablet Take 1 tablet (20 mg total) by mouth daily. 02/15/16  Yes Juluis Pitch, MD  clopidogrel (PLAVIX) 75 MG tablet Take 1 tablet (75 mg total) by mouth daily. 04/01/16  Yes Henreitta Leber, MD  cyclobenzaprine (FLEXERIL) 10 MG tablet Take 1 tablet (10 mg total) by mouth 3 (three) times daily as needed for muscle spasms. 02/15/16  Yes Juluis Pitch, MD  hydrochlorothiazide (HYDRODIURIL) 25 MG tablet Take 1 tablet (25 mg total) by mouth daily. 02/15/16  Yes Juluis Pitch, MD  ibuprofen (ADVIL,MOTRIN) 800 MG tablet Take 1 tablet (800 mg total) by mouth every 8 (eight) hours as needed for moderate pain. 12/22/15  Yes Benjaman Kindler, MD  ondansetron (ZOFRAN) 4 MG tablet Take 1 tablet (4 mg total) by mouth every 8 (eight) hours as needed for nausea or vomiting. 04/01/16  Yes Henreitta Leber, MD  pantoprazole (PROTONIX) 40 MG tablet  Take 1 tablet (40 mg total) by mouth daily. 02/15/16  Yes Juluis Pitch, MD     Assessment and Plan: 1. Compliance: Patient has been out of her medications. She has been unable to make the required annual medication review; states she forgets things easily and relies on family/friends for transportation. Patient was given a pillbox and several of her medications were filled today in the pharmacy. Refill requests were sent to her provider for: meclizine, ondansetron, ibuprofen and cyclobenzaprine. 2. Pre-Diabetes: A1c 6.0% 03/31/2016. Not taking any diabetes medications. I will reach out to [facility] regarding a  meter and test strips; patient is not currently monitoring her blood glucose. 3. Cholesterol: Has been out of her atorvastatin 20 mg. Lipid Profile 03/31/16: TC = 154 mg/dl, TG = 180 mg/dl, HDL = 36 mg/dl, LDL = 82 mg/dl. Discussed diet; encouraged low-fat/non-fried options. Encouraged more fruits (in moderation) and vegetables. Suggest using [place] bacon or decreasing the number of days/week of pork bacon. Also encouraged patient to start exercising and quit smoking as this will increase the HDL. Goal HDL > 50 mg/dl. 4. Exercise: Not currently exercising. Encouraged starting with 5-10 minutes of cardio per day. Long-term goal 150 minutes per week of cardio exercise. 6. Reflux: states not controlled on pantoprozole 40 mg daily. Patient has been out of this medication. Encouraged her to discuss with [facility] at next visit. Counseled on potential long-term effects of using PPI's such as: calcium, magnesium and B 12 deficiencies. If increased coverage is needed, consider using ranitidine 150 mg at night in addition to the PPI. Recommend B 12, calcium and magnesium levels to be checked, patient c/o tingling in her fingers. 7. Vertigo: Experiences off and on and symptoms are unpredicatable. Patient becomes dizzy and her legs give out. She does not drive. Sent refill request for meclizine. 8. Asthma: Using Ventolin inhaler and albuterol solution via nebulizer. Patient was given 3 inhalers 05/31/16 and is now out. After the visit, patient called about her Ventolin inhalers; counseled patient on the max dose of Ventolin. Discussed the potential for the Ventolin to cause the heart to race when used at max dose (12 puffs/24 hours). Consider assessing at next [facility] visit for use of a maintenance inhaler. 9. Cardiac: BP within goal of less than 140/90 mmHg. Patient had been out of her medication. History of stroke with residual memory loss per patient. Hydrochlorothiazide filled today.Patient is also on aspirin, atorvastatin and  clopidogrel. 10. Health Prevention: Patient would like a referral to [facility] recently approved) for: mammogram, colonoscopy, foot surgery, racing heart and stomach issues. I will reach out to [facility]. Patient is also requesting a follow up appointment with [facility]. 11. Smoking Cessation: Discussed benefits of cessation. States she had some success with the Nicotrol inhalers; needs new Rx. Tried patches in the past; not able to afford. Will resubmit PAP on Nicotrol; no samples available at [facility]. Will reassess at next visit. 12. Follow up: 6 months   Owain Eckerman K. Dicky Doe, PharmD [facility] [facility] Operations Coordinator [phone]

## 2016-07-18 ENCOUNTER — Ambulatory Visit: Payer: Self-pay | Admitting: Adult Health Nurse Practitioner

## 2016-07-18 VITALS — BP 158/94 | HR 88 | Wt 188.0 lb

## 2016-07-18 DIAGNOSIS — J449 Chronic obstructive pulmonary disease, unspecified: Secondary | ICD-10-CM | POA: Insufficient documentation

## 2016-07-18 DIAGNOSIS — E785 Hyperlipidemia, unspecified: Secondary | ICD-10-CM | POA: Insufficient documentation

## 2016-07-18 DIAGNOSIS — R7303 Prediabetes: Secondary | ICD-10-CM

## 2016-07-18 DIAGNOSIS — K219 Gastro-esophageal reflux disease without esophagitis: Secondary | ICD-10-CM | POA: Insufficient documentation

## 2016-07-18 DIAGNOSIS — I10 Essential (primary) hypertension: Secondary | ICD-10-CM | POA: Insufficient documentation

## 2016-07-18 MED ORDER — IBUPROFEN 800 MG PO TABS: 800.0000 mg | ORAL_TABLET | Freq: Three times a day (TID) | ORAL | 3 refills | Status: DC | PRN

## 2016-07-18 MED ORDER — ATORVASTATIN CALCIUM 20 MG PO TABS
20.0000 mg | ORAL_TABLET | Freq: Every day | ORAL | 1 refills | Status: DC
Start: 2016-07-18 — End: 2018-11-30

## 2016-07-18 MED ORDER — ALBUTEROL SULFATE HFA 108 (90 BASE) MCG/ACT IN AERS: INHALATION_SPRAY | RESPIRATORY_TRACT | 3 refills | Status: DC

## 2016-07-18 MED ORDER — AMLODIPINE BESYLATE 5 MG PO TABS: 5.0000 mg | ORAL_TABLET | Freq: Every day | ORAL | 0 refills | Status: DC

## 2016-07-18 MED ORDER — HYDROCHLOROTHIAZIDE 25 MG PO TABS: 25.0000 mg | ORAL_TABLET | Freq: Every day | ORAL | 3 refills | Status: AC

## 2016-07-18 MED ORDER — ONDANSETRON HCL 4 MG PO TABS: 4.0000 mg | ORAL_TABLET | Freq: Three times a day (TID) | ORAL | 0 refills | Status: DC | PRN

## 2016-07-18 NOTE — Progress Notes (Signed)
Patient: Jocelyn Simon Female    DOB: 1965/11/22   51 y.o.   MRN: [id number] Visit Date: 07/18/2016  Today's Provider: [place]   Chief Complaint  Patient presents with  . Sore Throat  . Pain   Subjective:    HPI      HTN:  Pt states that she is taking her medications as directed.  States that she is working on her diet.    Last A1C was 6 in Oct 2017.   Pt states that she get nauseated at times and takes zofran with relief.  Taking 3-4 times per week.  States that pantoprazole is not helping as well as she would like.   COPD:  Using Ventolin 2-3 times daily.  Not using neb treatments frequently.  Smoking 1/4 ppd.   HLD:  Taking medications as directed.  Denies abdominal pain or leg pain.      No Known Allergies Previous Medications   ALBUTEROL (PROVENTIL) (2.5 MG/3ML) 0.083% NEBULIZER SOLUTION    Take 2.5 mg by nebulization every 6 (six) hours as needed for wheezing or shortness of breath.   ASPIRIN EC 81 MG TABLET    Take 1 tablet (81 mg total) by mouth daily.   CLOPIDOGREL (PLAVIX) 75 MG TABLET    Take 1 tablet (75 mg total) by mouth daily.   ESTRADIOL (ESTRACE) 1 MG TABLET    Take 1 mg by mouth daily.   MECLIZINE (ANTIVERT) 25 MG TABLET    TAKE ONE TABLET BY MOUTH 3 TIMES A DAY.   PANTOPRAZOLE (PROTONIX) 40 MG TABLET    Take 1 tablet (40 mg total) by mouth daily.   PROGESTERONE (PROMETRIUM) 100 MG CAPSULE    Take 100 mg by mouth daily.    Review of Systems  All other systems reviewed and are negative.   Social History  Substance Use Topics  . Smoking status: Current Every Day Smoker    Packs/day: 0.25    Types: Cigarettes  . Smokeless tobacco: Never Used     Comment: interested in restarting Nicotrol inhalers   . Alcohol use No   Objective:   BP (!) 158/94 (BP Location: Left Arm)   Pulse 88   Wt 188 lb (85.3 kg)   BMI 35.52 kg/m   Physical Exam  Constitutional: She is oriented to person, place, and time. She appears  well-developed and well-nourished.  HENT:  Head: Normocephalic and atraumatic.  Eyes: Pupils are equal, round, and reactive to light.  Neck: Normal range of motion. Neck supple.  Cardiovascular: Normal rate, regular rhythm and normal heart sounds.   Pulmonary/Chest: Effort normal. She has wheezes.  Abdominal: Soft. Bowel sounds are normal.  Neurological: She is alert and oriented to person, place, and time.  Skin: Skin is warm and dry.  Psychiatric: She has a normal mood and affect.  Vitals reviewed.       Assessment & Plan:         HTN:  Not controlled.  Goal BP <140/80. Continue current medication regimen.  Add Norvasc 5mg  daily.  Encourage low salt diet and exercise.  FU in 1 month for BP check.    Pre-DM:  Encourage low sugar, fat diet.  A1C at next visit.   COPD:  Refill inhaler.  Smoking cessation.  While having cold symptoms use nebulizer.   HLD:  Continue stain.  Monitor LFTs today.   GERD:  Will trial Zantac. Samples given.   Info for mammogram and nebulizer machine given.  [facility]

## 2016-07-19 LAB — COMPREHENSIVE METABOLIC PANEL
ALT: 22 IU/L (ref 0–32)
AST: 17 IU/L (ref 0–40)
Albumin/Globulin Ratio: 1.5 (ref 1.2–2.2)
Albumin: 4.8 g/dL (ref 3.5–5.5)
Alkaline Phosphatase: 73 IU/L (ref 39–117)
BUN/Creatinine Ratio: 17 (ref 9–23)
BUN: 18 mg/dL (ref 6–24)
Bilirubin Total: 0.2 mg/dL (ref 0.0–1.2)
CO2: 25 mmol/L (ref 18–29)
Calcium: 9.4 mg/dL (ref 8.7–10.2)
Chloride: 94 mmol/L — ABNORMAL LOW (ref 96–106)
Creatinine, Ser: 1.03 mg/dL — ABNORMAL HIGH (ref 0.57–1.00)
GFR, EST AFRICAN AMERICAN: 73 mL/min/{1.73_m2} (ref >59)
GFR, EST NON AFRICAN AMERICAN: 63 mL/min/{1.73_m2} (ref >59)
Globulin, Total: 3.1 g/dL (ref 1.5–4.5)
Glucose: 98 mg/dL (ref 65–99)
Potassium: 3.9 mmol/L (ref 3.5–5.2)
Sodium: 137 mmol/L (ref 134–144)
TOTAL PROTEIN: 7.9 g/dL (ref 6.0–8.5)

## 2016-07-25 ENCOUNTER — Other Ambulatory Visit: Payer: Self-pay

## 2016-07-25 DIAGNOSIS — R110 Nausea: Secondary | ICD-10-CM

## 2016-07-25 MED ORDER — ONDANSETRON HCL 4 MG PO TABS: 4.0000 mg | ORAL_TABLET | Freq: Three times a day (TID) | ORAL | 0 refills | Status: DC | PRN

## 2016-08-08 ENCOUNTER — Telehealth: Payer: Self-pay | Admitting: Pharmacist

## 2016-08-08 NOTE — Telephone Encounter (Signed)
Nicotrol  PAP submitted to manufacturer today.

## 2016-08-15 ENCOUNTER — Ambulatory Visit: Payer: Self-pay | Admitting: Adult Health Nurse Practitioner

## 2016-08-15 VITALS — BP 113/76 | HR 87 | Temp 98.2°F | Wt 191.0 lb

## 2016-08-15 DIAGNOSIS — J449 Chronic obstructive pulmonary disease, unspecified: Secondary | ICD-10-CM

## 2016-08-15 DIAGNOSIS — R7303 Prediabetes: Secondary | ICD-10-CM

## 2016-08-15 DIAGNOSIS — I10 Essential (primary) hypertension: Secondary | ICD-10-CM

## 2016-08-15 DIAGNOSIS — R110 Nausea: Secondary | ICD-10-CM

## 2016-08-15 LAB — GLUCOSE, POCT (MANUAL RESULT ENTRY): POC GLUCOSE: 94 mg/dL (ref 70–99)

## 2016-08-15 MED ORDER — AMLODIPINE BESYLATE 5 MG PO TABS: 5.0000 mg | ORAL_TABLET | Freq: Every day | ORAL | 6 refills | Status: AC

## 2016-08-15 MED ORDER — ONDANSETRON HCL 4 MG PO TABS: 4.0000 mg | ORAL_TABLET | Freq: Three times a day (TID) | ORAL | 0 refills | Status: DC | PRN

## 2016-08-15 MED ORDER — MONTELUKAST SODIUM 10 MG PO TABS: 10.0000 mg | ORAL_TABLET | Freq: Every day | ORAL | 0 refills | Status: DC

## 2016-08-15 MED ORDER — GUAIFENESIN-DM 100-10 MG/5ML PO SYRP: 5.0000 mL | ORAL_SOLUTION | ORAL | 0 refills | Status: DC | PRN

## 2016-08-15 MED ORDER — ALBUTEROL SULFATE HFA 108 (90 BASE) MCG/ACT IN AERS: INHALATION_SPRAY | RESPIRATORY_TRACT | 3 refills | Status: DC

## 2016-08-15 NOTE — Progress Notes (Signed)
Patient: Jocelyn Simon Female    DOB: 19-Jun-1966   51 y.o.   MRN: [id number] Visit Date: 08/15/2016  Today's Provider: Staci Acosta, NP   Chief Complaint  Patient presents with  . Cough  . Nausea    never got the medication for it   Subjective:    HPI      HTN:  BP much improved.  Norvasc 5mg  added at last visit.  Last BP was 158/94.   Coughing:  Pt states that she is not taking anything at this time.  States she is cutting down on her cigarettes.  Denies fever/chills.    No Known Allergies Previous Medications   ALBUTEROL (PROVENTIL) (2.5 MG/3ML) 0.083% NEBULIZER SOLUTION    Take 2.5 mg by nebulization every 6 (six) hours as needed for wheezing or shortness of breath.   ALBUTEROL (VENTOLIN HFA) 108 (90 BASE) MCG/ACT INHALER    INHALE 2 PUFFS EVERY SIX HOURS AS NEEDED FOR COUGH, WHEEZING OR SHORTNESS OF BREATH.   AMLODIPINE (NORVASC) 5 MG TABLET    Take 1 tablet (5 mg total) by mouth daily.   ASPIRIN EC 81 MG TABLET    Take 1 tablet (81 mg total) by mouth daily.   ATORVASTATIN (LIPITOR) 20 MG TABLET    Take 1 tablet (20 mg total) by mouth daily.   CLOPIDOGREL (PLAVIX) 75 MG TABLET    Take 1 tablet (75 mg total) by mouth daily.   ESTRADIOL (ESTRACE) 1 MG TABLET    Take 1 mg by mouth daily.   HYDROCHLOROTHIAZIDE (HYDRODIURIL) 25 MG TABLET    Take 1 tablet (25 mg total) by mouth daily.   IBUPROFEN (ADVIL,MOTRIN) 800 MG TABLET    Take 1 tablet (800 mg total) by mouth every 8 (eight) hours as needed for moderate pain.   MECLIZINE (ANTIVERT) 25 MG TABLET    TAKE ONE TABLET BY MOUTH 3 TIMES A DAY.   ONDANSETRON (ZOFRAN) 4 MG TABLET    Take 1 tablet (4 mg total) by mouth every 8 (eight) hours as needed for nausea or vomiting.   PANTOPRAZOLE (PROTONIX) 40 MG TABLET    Take 1 tablet (40 mg total) by mouth daily.   PROGESTERONE (PROMETRIUM) 100 MG CAPSULE    Take 100 mg by mouth daily.    Review of Systems  All other systems reviewed and are negative.   Social History   Substance Use Topics  . Smoking status: Current Every Day Smoker    Packs/day: 0.25    Types: Cigarettes  . Smokeless tobacco: Never Used     Comment: interested in restarting Nicotrol inhalers   . Alcohol use No   Objective:   BP 113/76   Pulse 87   Temp 98.2 F (36.8 C)   Wt 191 lb (86.6 kg)   BMI 36.09 kg/m   Physical Exam  Constitutional: She is oriented to person, place, and time. She appears well-developed and well-nourished.  HENT:  Head: Normocephalic and atraumatic.  Neck: Normal range of motion. Neck supple.  Cardiovascular: Normal rate, regular rhythm and normal heart sounds.   Pulmonary/Chest: Effort normal.  Abdominal: Bowel sounds are normal.  Neurological: She is alert and oriented to person, place, and time.  Skin: Skin is warm and dry.        Assessment & Plan:         HTN:  Much improved.  Continue current regimen.   Refill Zofran.   Cough:  Encourage smoking cessation.  RX for cough syrup  and singular x 14 nights.  FU precautions given.  Increase PO fluids.   Staci Acosta, NP   [facility]

## 2016-09-12 ENCOUNTER — Other Ambulatory Visit: Payer: Self-pay | Admitting: Adult Health Nurse Practitioner

## 2016-09-12 DIAGNOSIS — J449 Chronic obstructive pulmonary disease, unspecified: Secondary | ICD-10-CM

## 2016-09-12 DIAGNOSIS — J312 Chronic pharyngitis: Secondary | ICD-10-CM | POA: Insufficient documentation

## 2016-10-02 ENCOUNTER — Ambulatory Visit: Payer: Self-pay

## 2016-10-07 ENCOUNTER — Other Ambulatory Visit: Payer: Self-pay | Admitting: Adult Health Nurse Practitioner

## 2016-10-08 ENCOUNTER — Telehealth: Payer: Self-pay | Admitting: Urology

## 2016-10-08 NOTE — Telephone Encounter (Signed)
Patient is requesting a refill on her cough medication.  She hasn't been seen since 07/2016.  If she is still coughing, she needs an office visit prior to refilling cough medication.

## 2016-10-10 ENCOUNTER — Ambulatory Visit: Payer: Self-pay

## 2016-10-14 ENCOUNTER — Telehealth: Payer: Self-pay | Admitting: Pharmacist

## 2016-10-14 NOTE — Telephone Encounter (Signed)
10/14/16 Called [facility] for refill on [employer], they will ship 10/22/16.

## 2016-10-23 ENCOUNTER — Ambulatory Visit: Payer: Self-pay

## 2016-10-24 ENCOUNTER — Telehealth: Payer: Self-pay | Admitting: Pharmacist

## 2016-10-24 NOTE — Telephone Encounter (Signed)
10/24/16 Placed refill online with [facility] for Ventolin HFA, to ship 12/10/16.

## 2016-10-30 ENCOUNTER — Other Ambulatory Visit: Payer: Self-pay | Admitting: Internal Medicine

## 2016-10-30 ENCOUNTER — Encounter: Payer: Self-pay | Admitting: *Deleted

## 2016-10-30 ENCOUNTER — Ambulatory Visit: Payer: Self-pay | Attending: Oncology | Admitting: *Deleted

## 2016-10-30 ENCOUNTER — Ambulatory Visit
Admission: RE | Admit: 2016-10-30 | Discharge: 2016-10-30 | Disposition: A | Discharge status: Home / Self Care | Payer: Self-pay | Source: Ambulatory Visit | Attending: Oncology | Admitting: Oncology

## 2016-10-30 VITALS — BP 130/83 | HR 88 | Temp 97.5°F | Resp 16 | Ht 63.0 in | Wt 189.0 lb

## 2016-10-30 DIAGNOSIS — Z0000 Encounter for general adult medical examination without abnormal findings: Secondary | ICD-10-CM

## 2016-10-30 NOTE — Patient Instructions (Signed)
HPV Test The human papillomavirus (HPV) test is used to look for high-risk types of HPV infection. HPV is a group of about 100 viruses. Many of these viruses cause growths on, in, or around the genitals. Most HPV viruses cause infections that usually go away without treatment. However, HPV types 6, 11, 16, and 18 are considered high-risk types of HPV that can increase your risk of cancer of the cervix or anus if the infection is left untreated. An HPV test identifies the DNA (genetic) strands of the HPV infection, so it is also referred to as the HPV DNA test. Although HPV is found in both males and females, the HPV test is only used to screen for increased cancer risk in females:  With an abnormal Pap test.  After treatment of an abnormal Pap test.  Between the ages of 30 and 65.  After treatment of a high-risk HPV infection. The HPV test may be done at the same time as a pelvic exam and Pap test in females over the age of 30. Both the HPV test and Pap test require a sample of cells from the cervix. How do I prepare for this test?  Do not douche or take a bath for 24-48 hours before the test or as directed by your health care provider.  Do not have sex for 24-48 hours before the test or as directed by your health care provider.  You may be asked to reschedule the test if you are menstruating.  You will be asked to urinate before the test. What do the results mean? It is your responsibility to obtain your test results. Ask the lab or department performing the test when and how you will get your results. Talk with your health care provider if you have any questions about your results. Your result will be negative or positive. Meaning of Negative Test Results  A negative HPV test result means that no HPV was found, and it is very likely that you do not have HPV. Meaning of Positive Test Results  A positive HPV test result indicates that you have HPV.  If your test result shows the presence  of any high-risk HPV strains, you may have an increased risk of developing cancer of the cervix or anus if the infection is left untreated.  If any low-risk HPV strains are found, you are not likely to have an increased risk of cancer. Discuss your test results with your health care provider. He or she will use the results to make a diagnosis and determine a treatment plan that is right for you. Talk with your health care provider to discuss your results, treatment options, and if necessary, the need for more tests. Talk with your health care provider if you have any questions about your results. This information is not intended to replace advice given to you by your health care provider. Make sure you discuss any questions you have with your health care provider. Document Released: 07/05/2004 Document Revised: 02/14/2016 Document Reviewed: 10/26/2013 Elsevier Interactive Patient Education  2017 Elsevier Inc.  Gave patient hand-out, Women Staying Healthy, Active and Well from [facility], with education on breast health, pap smears, heart and colon health.  

## 2016-10-30 NOTE — Progress Notes (Addendum)
Subjective:     Patient ID: Jocelyn Simon, female   DOB: May 09, 1966, 51 y.o.   MRN: [id number]  HPI   Review of Systems     Objective:   Physical Exam  Pulmonary/Chest: Right breast exhibits no inverted nipple, no mass, no nipple discharge, no skin change and no tenderness. Left breast exhibits no inverted nipple, no mass, no nipple discharge, no skin change and no tenderness. Breasts are symmetrical.  Left nipple is flat - patient is unsure if this is normal for her.  Abdominal: There is no splenomegaly or hepatomegaly.  Genitourinary: No labial fusion. There is no rash, tenderness, lesion or injury on the right labia. There is no rash, tenderness, lesion or injury on the left labia. Cervix exhibits no motion tenderness, no discharge and no friability. Right adnexum displays no mass, no tenderness and no fullness. Left adnexum displays no mass, no tenderness and no fullness. No erythema, tenderness or bleeding in the vagina. No foreign body in the vagina. No signs of injury around the vagina. No vaginal discharge found.       Assessment:     51 year old female presents to [facility] for clinical breast exam, pap and mammogram. This will be her first mammogram.  She is unsure of when her last pap smear was completed.  Clinical breast exam unremarkable. Taught self breast awareness.  Patient states she has had some bilateral breast pain that went away after her menstrual cycle.  Specimen collected for pap smear without difficulty.      Plan:     Screening mammogram ordered.  Specimen for pap sent to the lab.  Will follow-up per [facility] protocol.

## 2016-10-31 ENCOUNTER — Other Ambulatory Visit: Payer: Self-pay | Admitting: *Deleted

## 2016-10-31 DIAGNOSIS — N6489 Other specified disorders of breast: Secondary | ICD-10-CM

## 2016-11-04 LAB — PAP LB AND HPV HIGH-RISK
HPV, high-risk: NEGATIVE
PAP SMEAR COMMENT: 0

## 2016-11-05 ENCOUNTER — Other Ambulatory Visit: Payer: Self-pay

## 2016-11-12 ENCOUNTER — Ambulatory Visit: Payer: Self-pay

## 2016-11-13 ENCOUNTER — Other Ambulatory Visit: Payer: Self-pay | Admitting: Adult Health Nurse Practitioner

## 2016-11-14 ENCOUNTER — Ambulatory Visit: Payer: Medicaid Other

## 2016-11-20 ENCOUNTER — Ambulatory Visit: Payer: Medicaid Other

## 2016-11-20 ENCOUNTER — Ambulatory Visit: Admission: RE | Admit: 2016-11-20 | Payer: Medicaid Other | Source: Ambulatory Visit

## 2016-11-28 ENCOUNTER — Ambulatory Visit
Admission: RE | Admit: 2016-11-28 | Discharge: 2016-11-28 | Disposition: A | Discharge status: Home / Self Care | Payer: Medicaid Other | Source: Ambulatory Visit | Attending: Oncology | Admitting: Oncology

## 2016-11-28 DIAGNOSIS — N6489 Other specified disorders of breast: Secondary | ICD-10-CM

## 2016-11-29 ENCOUNTER — Other Ambulatory Visit: Payer: Self-pay

## 2016-11-29 DIAGNOSIS — N63 Unspecified lump in unspecified breast: Secondary | ICD-10-CM

## 2016-12-05 ENCOUNTER — Ambulatory Visit
Admission: RE | Admit: 2016-12-05 | Discharge: 2016-12-05 | Disposition: A | Discharge status: Home / Self Care | Payer: Medicaid Other | Source: Ambulatory Visit | Attending: Oncology | Admitting: Oncology

## 2016-12-05 DIAGNOSIS — N63 Unspecified lump in unspecified breast: Secondary | ICD-10-CM

## 2016-12-06 LAB — SURGICAL PATHOLOGY

## 2016-12-10 HISTORY — PX: BREAST BIOPSY: SHX20

## 2016-12-10 NOTE — Progress Notes (Signed)
Scheduled patient for consult with Dr. Jamal Collin on 12/16/16.  Patient is to arrive at 1:00 for a 1:30 appointment. Information on appointment given to patient's sister Laurey Arrow at patient's request.  May change appointment so sister can accompany patient.  Explained that [facility] will pay for consult.  Will discuss with surgeon, and patient if excision required.

## 2016-12-16 ENCOUNTER — Ambulatory Visit: Payer: Self-pay | Admitting: General Surgery

## 2016-12-18 ENCOUNTER — Ambulatory Visit (INDEPENDENT_AMBULATORY_CARE_PROVIDER_SITE_OTHER): Payer: Medicaid Other | Admitting: General Surgery

## 2016-12-18 ENCOUNTER — Encounter: Payer: Self-pay | Admitting: General Surgery

## 2016-12-18 VITALS — BP 126/78 | HR 89 | Resp 12 | Ht 61.0 in | Wt 186.0 lb

## 2016-12-18 DIAGNOSIS — D242 Benign neoplasm of left breast: Secondary | ICD-10-CM | POA: Diagnosis not present

## 2016-12-18 DIAGNOSIS — D369 Benign neoplasm, unspecified site: Secondary | ICD-10-CM | POA: Diagnosis not present

## 2016-12-18 NOTE — Progress Notes (Signed)
Patient ID: Jocelyn Simon, female   DOB: 1965/11/04, 51 y.o.   MRN: [id number]  Chief Complaint  Patient presents with  . Other    HPI Jocelyn Simon is a 51 y.o. female who presents for a breast evaluation. The most recent mammogram was done on 10/30/2016 added view on 11/28/2016 left breast biopsy done on 12/10/2017 .  Patient does perform regular self breast checks and gets regular mammograms done. Pt reports no breast complaints, no fhx of breast CA.   HPI  Past Medical History:  Diagnosis Date  . Asthma   . GERD (gastroesophageal reflux disease)   . Headache   . Hyperlipidemia   . Hypertension   . Stroke ([place])   . Vertigo     Past Surgical History:  Procedure Laterality Date  . LAPAROSCOPIC SALPINGO OOPHERECTOMY Bilateral 12/20/2015   Procedure: LAPAROSCOPIC BILATERAL SALPINGO OOPHORECTOMY WITH PELVIC WASHINGS;  Surgeon: Benjaman Kindler, MD;  Location: [facility] ORS;  Service: Gynecology;  Laterality: Bilateral;    Family History  Problem Relation Age of Onset  . CAD Mother   . CAD Sister   . CVA Other   . Breast cancer Neg Hx     Social History Social History  Substance Use Topics  . Smoking status: Current Every Day Smoker    Packs/day: 0.25    Types: Cigarettes  . Smokeless tobacco: Never Used     Comment: interested in restarting Nicotrol inhalers   . Alcohol use No    No Known Allergies  Current Outpatient Prescriptions  Medication Sig Dispense Refill  . albuterol (PROVENTIL) (2.5 MG/3ML) 0.083% nebulizer solution Take 2.5 mg by nebulization every 6 (six) hours as needed for wheezing or shortness of breath.    Marland Kitchen albuterol (VENTOLIN HFA) 108 (90 Base) MCG/ACT inhaler INHALE 2 PUFFS EVERY SIX HOURS AS NEEDED FOR COUGH, WHEEZING OR SHORTNESS OF BREATH. 2 Inhaler 3  . amLODipine (NORVASC) 5 MG tablet Take 1 tablet (5 mg total) by mouth daily. 30 tablet 6  . aspirin EC 81 MG tablet Take 1 tablet (81 mg total) by mouth daily. 90 tablet 3  . atorvastatin  (LIPITOR) 20 MG tablet Take 1 tablet (20 mg total) by mouth daily. 90 tablet 1  . clopidogrel (PLAVIX) 75 MG tablet Take 1 tablet (75 mg total) by mouth daily. 90 tablet 3  . estradiol (ESTRACE) 1 MG tablet Take 1 mg by mouth daily.    Marland Kitchen guaiFENesin-dextromethorphan (ROBITUSSIN DM) 100-10 MG/5ML syrup Take 5 mLs by mouth every 4 (four) hours as needed for cough. 240 mL 0  . hydrochlorothiazide (HYDRODIURIL) 25 MG tablet Take 1 tablet (25 mg total) by mouth daily. 30 tablet 3  . ibuprofen (ADVIL,MOTRIN) 800 MG tablet Take 1 tablet (800 mg total) by mouth every 8 (eight) hours as needed for moderate pain. 30 tablet 3  . meclizine (ANTIVERT) 25 MG tablet TAKE ONE TABLET BY MOUTH 3 TIMES A DAY. 30 tablet 0  . montelukast (SINGULAIR) 10 MG tablet Take 1 tablet (10 mg total) by mouth at bedtime. 14 tablet 0  . ondansetron (ZOFRAN) 4 MG tablet Take 1 tablet (4 mg total) by mouth every 8 (eight) hours as needed for nausea or vomiting. 20 tablet 0  . pantoprazole (PROTONIX) 40 MG tablet Take 1 tablet (40 mg total) by mouth daily. 90 tablet 1  . progesterone (PROMETRIUM) 100 MG capsule Take 100 mg by mouth daily.     No current facility-administered medications for this visit.  Review of Systems Review of Systems  Blood pressure 126/78, pulse 89, resp. rate 12, height 5\' 1"  (1.549 m), weight 186 lb (84.4 kg).  Physical Exam Physical Exam  Constitutional: She is oriented to person, place, and time. She appears well-developed and well-nourished.  Eyes: Conjunctivae are normal. No scleral icterus.  Neck: Neck supple.  Cardiovascular: Normal rate, regular rhythm and normal heart sounds.   Pulmonary/Chest: Effort normal and breath sounds normal. Right breast exhibits no inverted nipple, no mass, no nipple discharge, no skin change and no tenderness. Left breast exhibits no inverted nipple, no mass, no nipple discharge, no skin change and no tenderness.  Abdominal: Soft. Normal appearance and bowel  sounds are normal. There is no hepatomegaly.  Lymphadenopathy:    She has no cervical adenopathy.    She has no axillary adenopathy.  Neurological: She is alert and oriented to person, place, and time.  Skin: Skin is warm and dry.  Psychiatric: She has a normal mood and affect. Her behavior is normal.    Data Reviewed Prior notes, mammogram, and [place] reviewed. 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 Stable exam otherwise.    Plan    Surgical resection with wire localization recommended. Pt agreeable with plan.      HPI, Physical Exam, Assessment and Plan have been scribed under the direction and in the presence of Mckinley Jewel, MD  Gaspar Cola, CMA  I have completed the exam and reviewed the above documentation for accuracy and completeness.  I agree with the above.  Haematologist has been used and any errors in dictation or transcription are unintentional.  Sanjana Folz G. Jamal Collin, M.D., F.A.C.S.   Junie Panning G 12/18/2016, 4:51 PM  Patient's surgery has been scheduled for 01-07-17 at [facility]. It is okay for patient to continue an 81 mg apsirin once daily. Patient has been asked to stop Plavix five days prior to procedure.  Dominga Ferry, CMA

## 2016-12-18 NOTE — Patient Instructions (Signed)
Surgical  recommended. Pt agreeable with plan.    Breast Biopsy A breast biopsy is a procedure in which a sample of suspicious breast tissue is removed from your breast. Following the procedure, the tissue or liquid that is removed from the breast is examined under a microscope to see if cancerous cells are present. You may need a breast biopsy if you have:  Any undiagnosed breast mass (tumor).  Nipple abnormalities, dimpling, crusting, or ulcerations.  Abnormal discharge from the nipple, especially blood.  Redness, swelling, and pain of the breast.  Calcium deposits (calcifications) or abnormalities seen on a mammogram, ultrasound results, or MRI results.  Suspicious changes in the breast seen on your mammogram.  If the breast abnormality is found to be cancerous (malignant), a breast biopsy can help to determine what the best treatment is for you. There are many different types of breast biopsies. Talk with your health care provider about your options and which type is best for you. Tell a health care provider about:  Any allergies you have.  All medicines you are taking, including vitamins, herbs, eye drops, creams, and over-the-counter medicines.  Any problems you or family members have had with anesthetic medicines.  Any blood disorders you have.  Any surgeries you have had.  Any medical conditions you have.  Whether you are pregnant or may be pregnant. What are the risks? Generally, this is a safe procedure. However, problems may occur, including:  Bleeding.  Infection.  Discomfort. This is temporary.  Allergic reactions to medicines.  Bruising and swelling of the breast.  Alteration in the shape of the breast.  Damage to other tissues.  Not finding the lump or abnormality.  Needing more surgery.  What happens before the procedure?  Plan to have someone take you home after the procedure.  Do not use any tobacco products, such as cigarettes, chewing  tobacco, and e-cigarettes. If you need help quitting, ask your health care provider.  Do not drink alcohol for 24 hours before the procedure.  Ask your health care provider about: ? Changing or stopping your regular medicines. This is especially important if you are taking diabetes medicines or blood thinners. ? Taking medicines such as aspirin and ibuprofen. These medicines can thin your blood. Do not take these medicines before your procedure if your health care provider instructs you not to.  Wear a good support bra to the procedure.  Ask your health care provider how your surgical site will be marked or identified.  You may be given antibiotic medicine to help prevent infection.  Your health care provider may perform a procedure to place a wire (needle localization) or a seed that gives off radiation (radioactive seed localization) in the breast lump. A mammogram, ultrasound, MRI, or a combination of these techniques will be done during this procedure to identify the location of the breast abnormality. The imaging technique used will depend on the type of biopsy you are having. The wire or seed will help the health care provider locate the lump when performing the biopsy, especially if the lump cannot be felt. What happens during the procedure? You may be given one or both of the following:  A medicine to numb the breast area (local anesthetic).  A medicine to help you relax (sedative) during the procedure.  The following are the different types of biopsies that can be performed. Fine-Needle Aspiration A thin needle will be attached to a syringe and inserted into a breast cyst. Fluid and cells will  be removed. This technique is not as common as a core needle biopsy. Core Needle Biopsy A wide, hollow needle (core needle) will be inserted into a breast lump multiple times to remove tissue samples or cores. Stereotactic Biopsy You will lie face-down on a table. Your breast will pass  through an opening in the table and will be gently compressed into a fixed position. X-ray equipment and a computer will be used to locate the breast lump. The surgeon will use this information to collect several samples of tissue using a needle collection device. Vacuum-Assisted Biopsy A small incision (less than  inch) will be made in your breast. A biopsy device that includes a hollow needle and vacuum will be passed through the incision and into the breast tissue. The vacuum will gently draw abnormal breast tissue into the needle to remove it. No stitches (sutures) will be needed. The incision will be covered with a bandage (dressing). In this type of biopsy, a larger tissue sample is removed than in a regular core needle biopsy. Ultrasound-Guided Core Needle Biopsy A high-frequency ultrasound will be used to help guide the core needle to the area of the mass or abnormality. An incision will be made to insert the needle. Then tissue samples will be removed. Surgical Biopsy This method requires an incision in the breast to remove part or all of the suspicious tissue. After the tissue is removed, the skin over the area will be closed with sutures and covered with a dressing. There are two types of surgical biopsies:  Incisional biopsy. The surgeon will remove part of the breast lump.  Excisional biopsy. The surgeon will attempt to remove the whole breast lump or as much of it as possible.  After any of these procedures, the tissue or liquid that was removed will be examined under a microscope. What happens after the procedure?  You will be taken to the recovery area. If you are doing well and have no problems, you will be allowed to go home.  You may notice bruising on your breast. This is normal.  You may have a pressure dressing applied on your breast for 24-48 hours. A pressure dressing is a bandage that is wrapped tightly around the chest to stop fluid from collecting underneath tissues.  You may also be advised to wear a supportive bra during this time.  Do not drive for 24 hours if you received a sedative. This information is not intended to replace advice given to you by your health care provider. Make sure you discuss any questions you have with your health care provider. Document Released: 06/10/2005 Document Revised: 10/19/2015 Document Reviewed: 03/14/2015 Elsevier Interactive Patient Education  [employer].

## 2016-12-19 ENCOUNTER — Other Ambulatory Visit: Payer: Self-pay | Admitting: General Surgery

## 2016-12-19 DIAGNOSIS — D369 Benign neoplasm, unspecified site: Secondary | ICD-10-CM

## 2016-12-31 ENCOUNTER — Encounter
Admission: RE | Admit: 2016-12-31 | Discharge: 2016-12-31 | Disposition: A | Discharge status: Home / Self Care | Payer: Medicaid Other | Source: Ambulatory Visit | Attending: General Surgery | Admitting: General Surgery

## 2016-12-31 DIAGNOSIS — E785 Hyperlipidemia, unspecified: Secondary | ICD-10-CM | POA: Insufficient documentation

## 2016-12-31 DIAGNOSIS — Z01812 Encounter for preprocedural laboratory examination: Secondary | ICD-10-CM | POA: Diagnosis present

## 2016-12-31 DIAGNOSIS — R7303 Prediabetes: Secondary | ICD-10-CM | POA: Insufficient documentation

## 2016-12-31 DIAGNOSIS — J312 Chronic pharyngitis: Secondary | ICD-10-CM | POA: Insufficient documentation

## 2016-12-31 DIAGNOSIS — Z01810 Encounter for preprocedural cardiovascular examination: Secondary | ICD-10-CM | POA: Insufficient documentation

## 2016-12-31 DIAGNOSIS — I10 Essential (primary) hypertension: Secondary | ICD-10-CM | POA: Insufficient documentation

## 2016-12-31 DIAGNOSIS — R531 Weakness: Secondary | ICD-10-CM | POA: Insufficient documentation

## 2016-12-31 DIAGNOSIS — J449 Chronic obstructive pulmonary disease, unspecified: Secondary | ICD-10-CM | POA: Diagnosis not present

## 2016-12-31 DIAGNOSIS — K219 Gastro-esophageal reflux disease without esophagitis: Secondary | ICD-10-CM | POA: Insufficient documentation

## 2016-12-31 HISTORY — DX: Chronic obstructive pulmonary disease, unspecified: J44.9

## 2016-12-31 HISTORY — DX: Dyspnea, unspecified: R06.00

## 2016-12-31 HISTORY — DX: Anxiety disorder, unspecified: F41.9

## 2016-12-31 LAB — CBC
HCT: 37.8 % (ref 35.0–47.0)
HEMOGLOBIN: 12.6 g/dL (ref 12.0–16.0)
MCH: 28.5 pg (ref 26.0–34.0)
MCHC: 33.4 g/dL (ref 32.0–36.0)
MCV: 85.4 fL (ref 80.0–100.0)
PLATELETS: 274 10*3/uL (ref 150–440)
RBC: 4.43 MIL/uL (ref 3.80–5.20)
RDW: 14.3 % (ref 11.5–14.5)
WBC: 6.6 10*3/uL (ref 3.6–11.0)

## 2016-12-31 LAB — POTASSIUM: POTASSIUM: 4 mmol/L (ref 3.5–5.1)

## 2016-12-31 LAB — DIFFERENTIAL
BASOS ABS: 0.1 10*3/uL (ref 0–0.1)
Basophils Relative: 1 %
EOS PCT: 5 %
Eosinophils Absolute: 0.3 10*3/uL (ref 0–0.7)
LYMPHS ABS: 3.2 10*3/uL (ref 1.0–3.6)
LYMPHS PCT: 48 %
Monocytes Absolute: 0.7 10*3/uL (ref 0.2–0.9)
Monocytes Relative: 11 %
NEUTROS PCT: 35 %
Neutro Abs: 2.3 10*3/uL (ref 1.4–6.5)

## 2016-12-31 NOTE — Patient Instructions (Signed)
  Your procedure is scheduled on: January 07, 2017 [facility]) Report to [place] ARRIVAL TIME 7:45 AM   Remember: Instructions that are not followed completely may result in serious medical risk, up to and including death, or upon the discretion of your surgeon and anesthesiologist your surgery may need to be rescheduled.    _x___ 1. Do not eat food or drink liquids after midnight. No gum chewing or hard candies                            .     __x__ 2. No Alcohol for 24 hours before or after surgery.   __x__3. No Smoking for 24 prior to surgery.   ____  4. Bring all medications with you on the day of surgery if instructed.    __x__ 5. Notify your doctor if there is any change in your medical condition     (cold, fever, infections).     Do not wear jewelry, make-up, hairpins, clips or nail polish.  Do not wear lotions, powders, or perfumes.   Do not shave 48 hours prior to surgery. Men may shave face and neck.  Do not bring valuables to the hospital.    [facility] is not responsible for any belongings or valuables.               Contacts, dentures or bridgework may not be worn into surgery.  Leave your suitcase in the car. After surgery it may be brought to your room.  For patients admitted to the hospital, discharge time is determined by your treatment team                    Patients discharged the day of surgery will not be allowed to drive home.  You will need someone to drive you home and stay with you the night of your procedure.    Please read over the following fact sheets that you were given:   [facility] Preparing for Surgery and or MRSA Information   _x___ Take the following medications with a sip of water the morning of surgery :  1. PANTOPRAZOLE  (PANTOPRAZOLE AT BEDTIME  ON JULY 16 )  2.  3.  4.  5.  6.  ____Fleets enema or Magnesium Citrate as directed.   _x___ Use CHG Soap or sage wipes as directed on instruction sheet   _X___ Use inhalers on  the day of surgery and bring to hospital day of surgery  (USE ALBUTEROL [place] )  ____ Stop Metformin and Janumet 2 days prior to surgery.    ____ Take 1/2 of usual insulin dose the night before surgery and none on the morning surgery       _x___ Follow recommendations from Cardiologist, Pulmonologist or PCP regarding          stopping Aspirin, Coumadin, Plavix ,Eliquis, Effient, or Pradaxa, and Pletal. (PATIENT STATED SHE HAS ALREADY STOPPED ASPIRIN AND PLAVIX )  X____Stop Anti-inflammatories such as Advil, Aleve, Ibuprofen, Motrin, Naproxen, Naprosyn, Goodies powders or aspirin products.  (STOP IBUPROFEN TODAY) OK to take Tylenol    _x___ Stop supplements until after surgery.  But may continue Vitamin D, Vitamin B, and multivitamin         ____ Bring C-Pap to the hospital.

## 2017-01-07 ENCOUNTER — Ambulatory Visit
Admission: RE | Admit: 2017-01-07 | Discharge: 2017-01-07 | Disposition: A | Discharge status: Home / Self Care | Payer: Medicaid Other | Source: Ambulatory Visit | Attending: General Surgery | Admitting: General Surgery

## 2017-01-07 ENCOUNTER — Ambulatory Visit: Payer: Medicaid Other | Admitting: Anesthesiology

## 2017-01-07 ENCOUNTER — Encounter
Admission: RE | Disposition: A | Discharge status: Home / Self Care | Payer: Self-pay | Source: Ambulatory Visit | Attending: General Surgery

## 2017-01-07 DIAGNOSIS — E785 Hyperlipidemia, unspecified: Secondary | ICD-10-CM | POA: Insufficient documentation

## 2017-01-07 DIAGNOSIS — Z7982 Long term (current) use of aspirin: Secondary | ICD-10-CM | POA: Insufficient documentation

## 2017-01-07 DIAGNOSIS — J449 Chronic obstructive pulmonary disease, unspecified: Secondary | ICD-10-CM | POA: Diagnosis not present

## 2017-01-07 DIAGNOSIS — F17210 Nicotine dependence, cigarettes, uncomplicated: Secondary | ICD-10-CM | POA: Insufficient documentation

## 2017-01-07 DIAGNOSIS — Z7902 Long term (current) use of antithrombotics/antiplatelets: Secondary | ICD-10-CM | POA: Insufficient documentation

## 2017-01-07 DIAGNOSIS — K219 Gastro-esophageal reflux disease without esophagitis: Secondary | ICD-10-CM | POA: Diagnosis not present

## 2017-01-07 DIAGNOSIS — I10 Essential (primary) hypertension: Secondary | ICD-10-CM | POA: Insufficient documentation

## 2017-01-07 DIAGNOSIS — Z79899 Other long term (current) drug therapy: Secondary | ICD-10-CM | POA: Insufficient documentation

## 2017-01-07 DIAGNOSIS — F419 Anxiety disorder, unspecified: Secondary | ICD-10-CM | POA: Insufficient documentation

## 2017-01-07 DIAGNOSIS — Z8249 Family history of ischemic heart disease and other diseases of the circulatory system: Secondary | ICD-10-CM | POA: Diagnosis not present

## 2017-01-07 DIAGNOSIS — D369 Benign neoplasm, unspecified site: Secondary | ICD-10-CM

## 2017-01-07 DIAGNOSIS — N6012 Diffuse cystic mastopathy of left breast: Secondary | ICD-10-CM | POA: Diagnosis not present

## 2017-01-07 DIAGNOSIS — D242 Benign neoplasm of left breast: Secondary | ICD-10-CM | POA: Diagnosis present

## 2017-01-07 DIAGNOSIS — D0502 Lobular carcinoma in situ of left breast: Secondary | ICD-10-CM | POA: Insufficient documentation

## 2017-01-07 DIAGNOSIS — I69311 Memory deficit following cerebral infarction: Secondary | ICD-10-CM | POA: Diagnosis not present

## 2017-01-07 DIAGNOSIS — D241 Benign neoplasm of right breast: Secondary | ICD-10-CM

## 2017-01-07 HISTORY — PX: BREAST LUMPECTOMY WITH NEEDLE LOCALIZATION: SHX5759

## 2017-01-07 HISTORY — PX: BREAST EXCISIONAL BIOPSY: SUR124

## 2017-01-07 SURGERY — BREAST LUMPECTOMY WITH NEEDLE LOCALIZATION
Anesthesia: General | Laterality: Left | Wound class: Clean

## 2017-01-07 MED ORDER — ACETAMINOPHEN 10 MG/ML IV SOLN
INTRAVENOUS | Status: AC
  Filled 2017-01-07: qty 100

## 2017-01-07 MED ORDER — LIDOCAINE HCL (CARDIAC) 20 MG/ML IV SOLN
INTRAVENOUS | Status: DC | PRN
  Administered 2017-01-07: 100 mg via INTRAVENOUS

## 2017-01-07 MED ORDER — MIDAZOLAM HCL 2 MG/2ML IJ SOLN
INTRAMUSCULAR | Status: AC
  Filled 2017-01-07: qty 2

## 2017-01-07 MED ORDER — ONDANSETRON HCL 4 MG/2ML IJ SOLN: 4.0000 mg | Freq: Once | INTRAMUSCULAR | Status: DC | PRN

## 2017-01-07 MED ORDER — LIDOCAINE HCL (PF) 2 % IJ SOLN
INTRAMUSCULAR | Status: AC
  Filled 2017-01-07: qty 2

## 2017-01-07 MED ORDER — MIDAZOLAM HCL 2 MG/2ML IJ SOLN
INTRAMUSCULAR | Status: DC | PRN
  Administered 2017-01-07: 2 mg via INTRAVENOUS

## 2017-01-07 MED ORDER — LIDOCAINE HCL (PF) 1 % IJ SOLN
INTRAMUSCULAR | Status: AC
  Filled 2017-01-07: qty 2

## 2017-01-07 MED ORDER — FENTANYL CITRATE (PF) 100 MCG/2ML IJ SOLN
25.0000 ug | INTRAMUSCULAR | Status: AC | PRN
  Administered 2017-01-07 (×6): 25 ug via INTRAVENOUS

## 2017-01-07 MED ORDER — FENTANYL CITRATE (PF) 100 MCG/2ML IJ SOLN
INTRAMUSCULAR | Status: DC | PRN
  Administered 2017-01-07 (×2): 50 ug via INTRAVENOUS

## 2017-01-07 MED ORDER — BUPIVACAINE HCL 0.5 % IJ SOLN
INTRAMUSCULAR | Status: DC | PRN
  Administered 2017-01-07: 5 mL

## 2017-01-07 MED ORDER — ONDANSETRON HCL 4 MG/2ML IJ SOLN
INTRAMUSCULAR | Status: DC | PRN
  Administered 2017-01-07: 4 mg via INTRAVENOUS

## 2017-01-07 MED ORDER — PROPOFOL 10 MG/ML IV BOLUS
INTRAVENOUS | Status: AC
  Filled 2017-01-07: qty 20

## 2017-01-07 MED ORDER — FENTANYL CITRATE (PF) 100 MCG/2ML IJ SOLN
INTRAMUSCULAR | Status: AC
  Filled 2017-01-07: qty 2

## 2017-01-07 MED ORDER — DEXAMETHASONE SODIUM PHOSPHATE 10 MG/ML IJ SOLN
INTRAMUSCULAR | Status: DC | PRN
  Administered 2017-01-07: 5 mg via INTRAVENOUS

## 2017-01-07 MED ORDER — CHLORHEXIDINE GLUCONATE CLOTH 2 % EX PADS
6.0000 | MEDICATED_PAD | Freq: Once | CUTANEOUS | Status: AC
  Administered 2017-01-07: 6 via TOPICAL

## 2017-01-07 MED ORDER — TRAMADOL HCL 50 MG PO TABS: 50.0000 mg | ORAL_TABLET | Freq: Four times a day (QID) | ORAL | 0 refills | Status: DC | PRN

## 2017-01-07 MED ORDER — TRAMADOL HCL 50 MG PO TABS
50.0000 mg | ORAL_TABLET | Freq: Four times a day (QID) | ORAL | Status: DC
  Administered 2017-01-07: 50 mg via ORAL

## 2017-01-07 MED ORDER — ONDANSETRON HCL 4 MG/2ML IJ SOLN
INTRAMUSCULAR | Status: AC
  Filled 2017-01-07: qty 2

## 2017-01-07 MED ORDER — ACETAMINOPHEN 10 MG/ML IV SOLN
INTRAVENOUS | Status: DC | PRN
  Administered 2017-01-07: 1000 mg via INTRAVENOUS

## 2017-01-07 MED ORDER — DEXAMETHASONE SODIUM PHOSPHATE 10 MG/ML IJ SOLN
INTRAMUSCULAR | Status: AC
  Filled 2017-01-07: qty 1

## 2017-01-07 MED ORDER — LACTATED RINGERS IV SOLN
INTRAVENOUS | Status: DC
  Administered 2017-01-07: 09:00:00 via INTRAVENOUS

## 2017-01-07 MED ORDER — TRAMADOL HCL 50 MG PO TABS
ORAL_TABLET | ORAL | Status: AC
  Filled 2017-01-07: qty 1

## 2017-01-07 MED ORDER — LIDOCAINE HCL 1 % IJ SOLN
INTRAMUSCULAR | Status: DC | PRN
  Administered 2017-01-07: 5 mL

## 2017-01-07 MED ORDER — PROPOFOL 10 MG/ML IV BOLUS
INTRAVENOUS | Status: DC | PRN
  Administered 2017-01-07: 180 mg via INTRAVENOUS

## 2017-01-07 SURGICAL SUPPLY — 35 items
BLADE SURG 15 STRL SS SAFETY (BLADE) ×3 IMPLANT
CANISTER SUCT 1200ML W/VALVE (MISCELLANEOUS) ×3 IMPLANT
CHLORAPREP W/TINT 26ML (MISCELLANEOUS) ×3 IMPLANT
COVER PROBE FLX POLY STRL (MISCELLANEOUS) ×3 IMPLANT
DERMABOND ADVANCED (GAUZE/BANDAGES/DRESSINGS) ×2
DERMABOND ADVANCED .7 DNX12 (GAUZE/BANDAGES/DRESSINGS) ×1 IMPLANT
DEVICE DUBIN SPECIMEN MAMMOGRA (MISCELLANEOUS) ×3 IMPLANT
DEVICE LOCALIZATION ULTRAWIRE (WIRE) IMPLANT
DEVICE ULTRAWIRE LOCAL 19X9 (WIRE) IMPLANT
DRAPE LAPAROTOMY 100X77 ABD (DRAPES) ×3 IMPLANT
ELECT CAUTERY BLADE TIP 2.5 (TIP) ×3
ELECT REM PT RETURN 9FT ADLT (ELECTROSURGICAL) ×3
ELECTRODE CAUTERY BLDE TIP 2.5 (TIP) ×1 IMPLANT
ELECTRODE REM PT RTRN 9FT ADLT (ELECTROSURGICAL) ×1 IMPLANT
GLOVE BIO SURGEON STRL SZ7 (GLOVE) ×15 IMPLANT
GOWN STRL REUS W/ TWL LRG LVL3 (GOWN DISPOSABLE) ×3 IMPLANT
GOWN STRL REUS W/TWL LRG LVL3 (GOWN DISPOSABLE) ×6
KIT RM TURNOVER STRD PROC AR (KITS) ×3 IMPLANT
LABEL OR SOLS (LABEL) ×3 IMPLANT
MARGIN MAP 10MM (MISCELLANEOUS) ×3 IMPLANT
NDL HPO THNWL 1X22GA REG BVL (NEEDLE) ×1 IMPLANT
NEEDLE HYPO 25X1 1.5 SAFETY (NEEDLE) ×3 IMPLANT
NEEDLE SAFETY 22GX1 (NEEDLE) ×2
PACK BASIN MINOR ARMC (MISCELLANEOUS) ×3 IMPLANT
SUT ETHILON 3-0 FS-10 30 BLK (SUTURE) ×3
SUT VIC AB 2-0 SH 27 (SUTURE) ×2
SUT VIC AB 2-0 SH 27XBRD (SUTURE) ×1 IMPLANT
SUT VIC AB 3-0 54X BRD REEL (SUTURE) IMPLANT
SUT VIC AB 3-0 BRD 54 (SUTURE)
SUT VIC AB 4-0 FS2 27 (SUTURE) ×3 IMPLANT
SUTURE EHLN 3-0 FS-10 30 BLK (SUTURE) ×1 IMPLANT
SYR CONTROL 10ML (SYRINGE) ×3 IMPLANT
ULTRAWIRE LOCAL DEVICE 19X9 (WIRE)
ULTRAWIRE LOCALIZATION DEVICE (WIRE)
WATER STERILE IRR 1000ML POUR (IV SOLUTION) ×3 IMPLANT

## 2017-01-07 NOTE — Anesthesia Procedure Notes (Signed)
Procedure Name: LMA Insertion Performed by: Lance Muss Pre-anesthesia Checklist: Patient identified, Patient being monitored, Timeout performed, Emergency Drugs available and Suction available Patient Re-evaluated:Patient Re-evaluated prior to induction Oxygen Delivery Method: Circle system utilized Preoxygenation: Pre-oxygenation with 100% oxygen Induction Type: IV induction Ventilation: Mask ventilation without difficulty LMA: LMA inserted Tube type: Oral Tube size: 3.5 mm Number of attempts: 1 Placement Confirmation: positive ETCO2 and breath sounds checked- equal and bilateral Tube secured with: Tape Dental Injury: Teeth and Oropharynx as per pre-operative assessment

## 2017-01-07 NOTE — Transfer of Care (Signed)
Immediate Anesthesia Transfer of Care Note  Patient: Jocelyn Simon  Procedure(s) Performed: Procedure(s): BREAST LUMPECTOMY WITH NEEDLE LOCALIZATION (Left)  Patient Location: PACU  Anesthesia Type:General  Level of Consciousness: sedated and responds to stimulation  Airway & Oxygen Therapy: Patient Spontanous Breathing and Patient connected to face mask oxygen  Post-op Assessment: Report given to RN and Post -op Vital signs reviewed and stable  Post vital signs: Reviewed and stable  Last Vitals:  Vitals:   01/07/17 0834 01/07/17 1307  BP: (!) 117/93 119/81  Pulse: 71 75  Resp: 12 13  Temp: (!) 35.6 C     Last Pain:  Vitals:   01/07/17 0834  PainSc: 5          Complications: No apparent anesthesia complications

## 2017-01-07 NOTE — H&P (View-Only) (Signed)
Patient ID: Jocelyn Simon, female   DOB: Mar 15, 1966, 51 y.o.   MRN: [id number]  Chief Complaint  Patient presents with  . Other    HPI Jocelyn Simon is a 51 y.o. female who presents for a breast evaluation. The most recent mammogram was done on 10/30/2016 added view on 11/28/2016 left breast biopsy done on 12/10/2017 .  Patient does perform regular self breast checks and gets regular mammograms done. Pt reports no breast complaints, no fhx of breast CA.   HPI  Past Medical History:  Diagnosis Date  . Asthma   . GERD (gastroesophageal reflux disease)   . Headache   . Hyperlipidemia   . Hypertension   . Stroke ([place])   . Vertigo     Past Surgical History:  Procedure Laterality Date  . LAPAROSCOPIC SALPINGO OOPHERECTOMY Bilateral 12/20/2015   Procedure: LAPAROSCOPIC BILATERAL SALPINGO OOPHORECTOMY WITH PELVIC WASHINGS;  Surgeon: Benjaman Kindler, MD;  Location: [facility] ORS;  Service: Gynecology;  Laterality: Bilateral;    Family History  Problem Relation Age of Onset  . CAD Mother   . CAD Sister   . CVA Other   . Breast cancer Neg Hx     Social History Social History  Substance Use Topics  . Smoking status: Current Every Day Smoker    Packs/day: 0.25    Types: Cigarettes  . Smokeless tobacco: Never Used     Comment: interested in restarting Nicotrol inhalers   . Alcohol use No    No Known Allergies  Current Outpatient Prescriptions  Medication Sig Dispense Refill  . albuterol (PROVENTIL) (2.5 MG/3ML) 0.083% nebulizer solution Take 2.5 mg by nebulization every 6 (six) hours as needed for wheezing or shortness of breath.    Marland Kitchen albuterol (VENTOLIN HFA) 108 (90 Base) MCG/ACT inhaler INHALE 2 PUFFS EVERY SIX HOURS AS NEEDED FOR COUGH, WHEEZING OR SHORTNESS OF BREATH. 2 Inhaler 3  . amLODipine (NORVASC) 5 MG tablet Take 1 tablet (5 mg total) by mouth daily. 30 tablet 6  . aspirin EC 81 MG tablet Take 1 tablet (81 mg total) by mouth daily. 90 tablet 3  . atorvastatin  (LIPITOR) 20 MG tablet Take 1 tablet (20 mg total) by mouth daily. 90 tablet 1  . clopidogrel (PLAVIX) 75 MG tablet Take 1 tablet (75 mg total) by mouth daily. 90 tablet 3  . estradiol (ESTRACE) 1 MG tablet Take 1 mg by mouth daily.    Marland Kitchen guaiFENesin-dextromethorphan (ROBITUSSIN DM) 100-10 MG/5ML syrup Take 5 mLs by mouth every 4 (four) hours as needed for cough. 240 mL 0  . hydrochlorothiazide (HYDRODIURIL) 25 MG tablet Take 1 tablet (25 mg total) by mouth daily. 30 tablet 3  . ibuprofen (ADVIL,MOTRIN) 800 MG tablet Take 1 tablet (800 mg total) by mouth every 8 (eight) hours as needed for moderate pain. 30 tablet 3  . meclizine (ANTIVERT) 25 MG tablet TAKE ONE TABLET BY MOUTH 3 TIMES A DAY. 30 tablet 0  . montelukast (SINGULAIR) 10 MG tablet Take 1 tablet (10 mg total) by mouth at bedtime. 14 tablet 0  . ondansetron (ZOFRAN) 4 MG tablet Take 1 tablet (4 mg total) by mouth every 8 (eight) hours as needed for nausea or vomiting. 20 tablet 0  . pantoprazole (PROTONIX) 40 MG tablet Take 1 tablet (40 mg total) by mouth daily. 90 tablet 1  . progesterone (PROMETRIUM) 100 MG capsule Take 100 mg by mouth daily.     No current facility-administered medications for this visit.  Review of Systems Review of Systems  Blood pressure 126/78, pulse 89, resp. rate 12, height 5\' 1"  (1.549 m), weight 186 lb (84.4 kg).  Physical Exam Physical Exam  Constitutional: She is oriented to person, place, and time. She appears well-developed and well-nourished.  Eyes: Conjunctivae are normal. No scleral icterus.  Neck: Neck supple.  Cardiovascular: Normal rate, regular rhythm and normal heart sounds.   Pulmonary/Chest: Effort normal and breath sounds normal. Right breast exhibits no inverted nipple, no mass, no nipple discharge, no skin change and no tenderness. Left breast exhibits no inverted nipple, no mass, no nipple discharge, no skin change and no tenderness.  Abdominal: Soft. Normal appearance and bowel  sounds are normal. There is no hepatomegaly.  Lymphadenopathy:    She has no cervical adenopathy.    She has no axillary adenopathy.  Neurological: She is alert and oriented to person, place, and time.  Skin: Skin is warm and dry.  Psychiatric: She has a normal mood and affect. Her behavior is normal.    Data Reviewed Prior notes, mammogram, and [place] reviewed. 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 Stable exam otherwise.    Plan    Surgical resection with wire localization recommended. Pt agreeable with plan.      HPI, Physical Exam, Assessment and Plan have been scribed under the direction and in the presence of Mckinley Jewel, MD  Gaspar Cola, CMA  I have completed the exam and reviewed the above documentation for accuracy and completeness.  I agree with the above.  Haematologist has been used and any errors in dictation or transcription are unintentional.  Seeplaputhur G. Jamal Collin, M.D., F.A.C.S.   Junie Panning G 12/18/2016, 4:51 PM  Patient's surgery has been scheduled for 01-07-17 at [facility]. It is okay for patient to continue an 81 mg apsirin once daily. Patient has been asked to stop Plavix five days prior to procedure.  Dominga Ferry, CMA

## 2017-01-07 NOTE — Anesthesia Post-op Follow-up Note (Cosign Needed)
Anesthesia QCDR form completed.        

## 2017-01-07 NOTE — Anesthesia Postprocedure Evaluation (Signed)
Anesthesia Post Note  Patient: Jocelyn Simon  Procedure(s) Performed: Procedure(s) (LRB): BREAST LUMPECTOMY WITH NEEDLE LOCALIZATION (Left)  Patient location during evaluation: PACU Anesthesia Type: General Level of consciousness: awake and alert Pain management: pain level controlled Vital Signs Assessment: post-procedure vital signs reviewed and stable Respiratory status: spontaneous breathing and respiratory function stable Cardiovascular status: stable Anesthetic complications: no     Last Vitals:  Vitals:   01/07/17 1404 01/07/17 1415  BP: (!) 150/97 (!) 154/95  Pulse: 74   Resp: 14 16  Temp: (!) 35.9 C     Last Pain:  Vitals:   01/07/17 1404  TempSrc: Temporal  PainSc: 10-Worst pain ever                 Leira Regino K

## 2017-01-07 NOTE — Interval H&P Note (Signed)
History and Physical Interval Note:  01/07/2017 11:18 AM  Jocelyn Simon  has presented today for surgery, with the diagnosis of INTRADUCTAL PAPILLOMA LEFT BREAST  The various methods of treatment have been discussed with the patient and family. After consideration of risks, benefits and other options for treatment, the patient has consented to  Procedure(s): BREAST LUMPECTOMY WITH NEEDLE LOCALIZATION (Left) as a surgical intervention .  The patient's history has been reviewed, patient examined, no change in status, stable for surgery.  I have reviewed the patient's chart and labs.  Questions were answered to the patient's satisfaction.     SANKAR,SEEPLAPUTHUR G

## 2017-01-07 NOTE — Discharge Instructions (Signed)
AMBULATORY SURGERY  DISCHARGE INSTRUCTIONS   1) The drugs that you were given will stay in your system until tomorrow so for the next 24 hours you should not:  A) Drive an automobile B) Make any legal decisions C) Drink any alcoholic beverage   2) You may resume regular meals tomorrow.  Today it is better to start with liquids and gradually work up to solid foods.  You may eat anything you prefer, but it is better to start with liquids, then soup and crackers, and gradually work up to solid foods.   3) Please notify your doctor immediately if you have any unusual bleeding, trouble breathing, redness and pain at the surgery site, drainage, fever, or pain not relieved by medication.    4) Additional Instructions: Keep bra on for 24 hours a day to eliminate pain from your breast tissue pulling downward. Use ice about 15 minutes out of every hour.  Place a small pillow under left arm pit to keep your arm away from your breast. Do not lift anything with the surgical arm.    Please contact your physician with any problems or Same Day Surgery at [phone], Monday through Friday 6 am to 4 pm, or [place] at [facility] number at [phone].

## 2017-01-07 NOTE — Op Note (Signed)
Preop diagnosis: Intraductal papilloma left breast  Post op diagnosis: Same  Operation: Excision of intraductal papilloma left breast with the wire localization  Surgeon: Mckinley Jewel  Assistant:     Anesthesia: Gen.  Complications: None  EBL: Minimal  Drains: None  Description: Patient underwent localization of a previously biopsied papilloma in the left breast approximated 2.30 ocl location outside of the areola. She was brought to the operating room put to sleep with an LMA and the left breast prepped and draped sterile field. Timeout was performed. A curved incision was made overlying the side of the expected lesion. Incision was deepened through the subcutaneous tissue was elevated on both sides. The wire was then freed and using this as a guide a core tissue surrounding this was excised out all the way beyond the end of the wire. Bleeding controlled cautery. The excised tissue was tagged for margins and sent to radiology with the x-ray showing presence of the clip within. It was then sent to pathology. After ensuring hemostasis the wound was irrigated in the deep tissues closed with 2-0 Vicryl in 2 layers. Subcutaneous tissue also closed with 2-0 Vicryl. Skin was closed with subcuticular 4-0 Vicryl covered with Dermabond. Patient subsequently returned recovery room stable condition

## 2017-01-07 NOTE — Anesthesia Preprocedure Evaluation (Signed)
Anesthesia Evaluation  Patient identified by MRN, date of birth, ID band Patient awake    Reviewed: Allergy & Precautions, NPO status , Patient's Chart, lab work & pertinent test results  History of Anesthesia Complications Negative for: history of anesthetic complications  Airway Mallampati: III       Dental   Pulmonary asthma , COPD,  COPD inhaler, Current Smoker,           Cardiovascular hypertension, Pt. on medications      Neuro/Psych Anxiety CVA (memory difficulties)    GI/Hepatic Neg liver ROS, GERD  Medicated and Controlled,  Endo/Other  negative endocrine ROS  Renal/GU negative Renal ROS     Musculoskeletal   Abdominal   Peds  Hematology   Anesthesia Other Findings   Reproductive/Obstetrics                            Anesthesia Physical Anesthesia Plan  ASA: III  Anesthesia Plan: General   Post-op Pain Management:    Induction: Intravenous  PONV Risk Score and Plan: 3 and Ondansetron, Dexamethasone, Propofol and Midazolam  Airway Management Planned: LMA  Additional Equipment:   Intra-op Plan:   Post-operative Plan:   Informed Consent: I have reviewed the patients History and Physical, chart, labs and discussed the procedure including the risks, benefits and alternatives for the proposed anesthesia with the patient or authorized representative who has indicated his/her understanding and acceptance.     Plan Discussed with:   Anesthesia Plan Comments:         Anesthesia Quick Evaluation

## 2017-01-08 ENCOUNTER — Encounter: Payer: Self-pay | Admitting: General Surgery

## 2017-01-08 LAB — SURGICAL PATHOLOGY

## 2017-01-13 ENCOUNTER — Ambulatory Visit: Payer: Medicaid Other | Admitting: Pharmacist

## 2017-01-13 DIAGNOSIS — Z79899 Other long term (current) drug therapy: Secondary | ICD-10-CM

## 2017-01-13 NOTE — Progress Notes (Signed)
Patient was scheduled for a follow up medication therapy management review with the pharmacsit. She showed up today and stated that she now has Medicaid. She was given the option to complete the visit, however, she opted out.  Bruin Bolger K. Dicky Doe, PharmD [facility] [place] Operations Coordinator [phone]

## 2017-01-15 ENCOUNTER — Encounter: Payer: Self-pay | Admitting: General Surgery

## 2017-01-15 ENCOUNTER — Ambulatory Visit (INDEPENDENT_AMBULATORY_CARE_PROVIDER_SITE_OTHER): Payer: Medicaid Other | Admitting: General Surgery

## 2017-01-15 VITALS — BP 130/80 | HR 74 | Ht 61.0 in | Wt 186.0 lb

## 2017-01-15 DIAGNOSIS — D369 Benign neoplasm, unspecified site: Secondary | ICD-10-CM

## 2017-01-15 DIAGNOSIS — D0502 Lobular carcinoma in situ of left breast: Secondary | ICD-10-CM

## 2017-01-15 NOTE — Progress Notes (Signed)
Patient ID: Jocelyn Simon, female   DOB: [birth year], 51 y.o.   MRN: [id number]  Chief Complaint  Patient presents with  . Routine Post Op    HPI Jocelyn Simon is a 51 y.o. female here today for her post op left lumpectomy done on 01/07/2017. She is doing well and has no new complaints.  HPI  Past Medical History:  Diagnosis Date  . Anxiety   . Asthma   . COPD (chronic obstructive pulmonary disease) ([place])   . Dyspnea    with exertion  . GERD (gastroesophageal reflux disease)   . Headache   . Hyperlipidemia   . Hypertension   . Stroke ([place])   . Vertigo     Past Surgical History:  Procedure Laterality Date  . BREAST BIOPSY Left 12/10/2016   papilloma  . BREAST EXCISIONAL BIOPSY Left 01/07/2017   excision of papilloma  . BREAST LUMPECTOMY WITH NEEDLE LOCALIZATION Left 01/07/2017   Procedure: BREAST LUMPECTOMY WITH NEEDLE LOCALIZATION;  Surgeon: Christene Lye, MD;  Location: [facility] ORS;  Service: General;  Laterality: Left;  . LAPAROSCOPIC SALPINGO OOPHERECTOMY Bilateral 12/20/2015   Procedure: LAPAROSCOPIC BILATERAL SALPINGO OOPHORECTOMY WITH PELVIC WASHINGS;  Surgeon: Benjaman Kindler, MD;  Location: [facility] ORS;  Service: Gynecology;  Laterality: Bilateral;  . TUBAL LIGATION      Family History  Problem Relation Age of Onset  . CAD Mother   . CAD Sister   . CVA Other   . Breast cancer Neg Hx     Social History Social History  Substance Use Topics  . Smoking status: Current Every Day Smoker    Packs/day: 0.25    Types: Cigarettes  . Smokeless tobacco: Never Used     Comment: interested in restarting Nicotrol inhalers   . Alcohol use No    No Known Allergies  Current Outpatient Prescriptions  Medication Sig Dispense Refill  . albuterol (PROVENTIL) (2.5 MG/3ML) 0.083% nebulizer solution Take 2.5 mg by nebulization every 6 (six) hours as needed for wheezing or shortness of breath.    Marland Kitchen albuterol (VENTOLIN HFA) 108 (90 Base) MCG/ACT inhaler INHALE 2  PUFFS EVERY SIX HOURS AS NEEDED FOR COUGH, WHEEZING OR SHORTNESS OF BREATH. 2 Inhaler 3  . amLODipine (NORVASC) 5 MG tablet Take 1 tablet (5 mg total) by mouth daily. (Patient taking differently: Take 5 mg by mouth at bedtime. ) 30 tablet 6  . amoxicillin-clavulanate (AUGMENTIN) 875-125 MG tablet Take 1 tablet by mouth 2 (two) times daily. For 14 days, started 12/23/16-01/06/17  0  . aspirin EC 81 MG tablet Take 1 tablet (81 mg total) by mouth daily. 90 tablet 3  . atorvastatin (LIPITOR) 20 MG tablet Take 1 tablet (20 mg total) by mouth daily. (Patient taking differently: Take 20 mg by mouth daily at 6 PM. ) 90 tablet 1  . clopidogrel (PLAVIX) 75 MG tablet Take 1 tablet (75 mg total) by mouth daily. 90 tablet 3  . estradiol (ESTRACE) 1 MG tablet Take 1 mg by mouth daily.    . fluticasone (FLONASE) 50 MCG/ACT nasal spray Place 2 sprays into both nostrils daily.  12  . guaiFENesin-dextromethorphan (ROBITUSSIN DM) 100-10 MG/5ML syrup Take 5 mLs by mouth every 4 (four) hours as needed for cough. 240 mL 0  . hydrochlorothiazide (HYDRODIURIL) 25 MG tablet Take 1 tablet (25 mg total) by mouth daily. 30 tablet 3  . ibuprofen (ADVIL,MOTRIN) 800 MG tablet Take 1 tablet (800 mg total) by mouth every 8 (eight) hours as needed for  moderate pain. 30 tablet 3  . meclizine (ANTIVERT) 25 MG tablet TAKE ONE TABLET BY MOUTH 3 TIMES A DAY. 30 tablet 0  . montelukast (SINGULAIR) 10 MG tablet Take 1 tablet (10 mg total) by mouth at bedtime. 14 tablet 0  . ondansetron (ZOFRAN) 4 MG tablet Take 1 tablet (4 mg total) by mouth every 8 (eight) hours as needed for nausea or vomiting. 20 tablet 0  . pantoprazole (PROTONIX) 40 MG tablet Take 1 tablet (40 mg total) by mouth daily. 90 tablet 1  . progesterone (PROMETRIUM) 100 MG capsule Take 100 mg by mouth daily.    . traMADol (ULTRAM) 50 MG tablet Take 1 tablet (50 mg total) by mouth every 6 (six) hours as needed. 10 tablet 0   No current facility-administered medications for this  visit.     Review of Systems Review of Systems  Constitutional: Negative.   Respiratory: Negative.   Cardiovascular: Negative.     Blood pressure 130/80, pulse 74, height 5\' 1"  (1.549 m), weight 186 lb (84.4 kg).  Physical Exam Physical Exam  Constitutional: She is oriented to person, place, and time. She appears well-developed and well-nourished.  Pulmonary/Chest:    Neurological: She is alert and oriented to person, place, and time.  Skin: Skin is warm and dry.  Psychiatric: She has a normal mood and affect.    Data Reviewed Op note, pathology   Assessment  Intraductal papilloma of left breast, lumpectomy with wire localization - pt is 8 days post-op and doing well, confirmed by pathology Pathology also showed focal LCIS.This is not a malignant finding, but does increase her risk for cancer    Plan  Pt advised on the above findings.  Follow up in 5 months with diagnostic mammogram of left breast.     HPI, Physical Exam, Assessment and Plan have been scribed under the direction and in the presence of Mckinley Jewel, MD Karie Fetch, RN   I have completed the exam and reviewed the above documentation for accuracy and completeness.  I agree with the above.  Haematologist has been used and any errors in dictation or transcription are unintentional.  Lindzee Gouge G. Jamal Collin, M.D., F.A.C.S.   Junie Panning G 01/15/2017, 11:47 AM

## 2017-01-15 NOTE — Patient Instructions (Addendum)
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

## 2017-01-17 ENCOUNTER — Encounter: Payer: Self-pay | Admitting: *Deleted

## 2017-01-17 ENCOUNTER — Other Ambulatory Visit: Payer: Self-pay | Admitting: *Deleted

## 2017-01-17 DIAGNOSIS — D242 Benign neoplasm of left breast: Secondary | ICD-10-CM

## 2017-01-17 NOTE — Progress Notes (Signed)
Letter mailed to inform patient of her next mammogram on 06/10/17 @ 10:00, and her normal pap smear.  Next pap due in 5 years.  [facility] to [place].

## 2017-03-01 IMAGING — CT CT HEAD CODE STROKE
3 series · 15 of 47 positions shown, 18 images · non-contrast
Comparison: Brain MRI 04/08/2015

CLINICAL DATA: Code stroke.  Left-sided weakness

EXAM:
CT HEAD WITHOUT CONTRAST
TECHNIQUE: Contiguous axial images were obtained from the base of the skull
through the vertex without intravenous contrast.

[Series 2: head wo · axial · 0.47mm/px · z∈[-147,-22]mm · 9 of 30 slices shown, 12 images]
[im 3/30  brain]
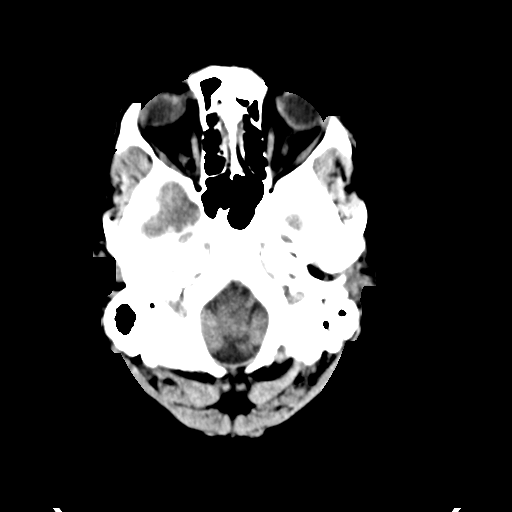
[im 3/30  bone]
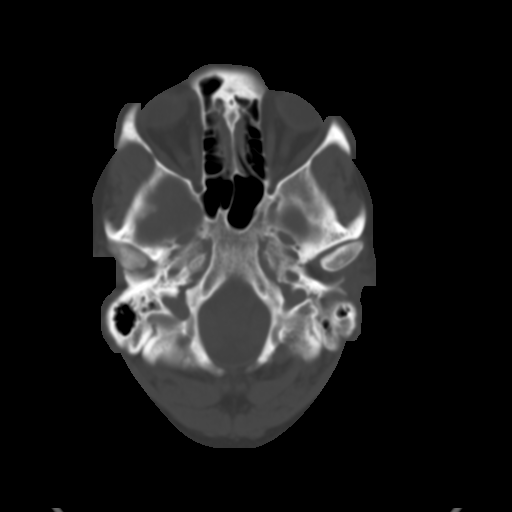
[im 6/30  brain]
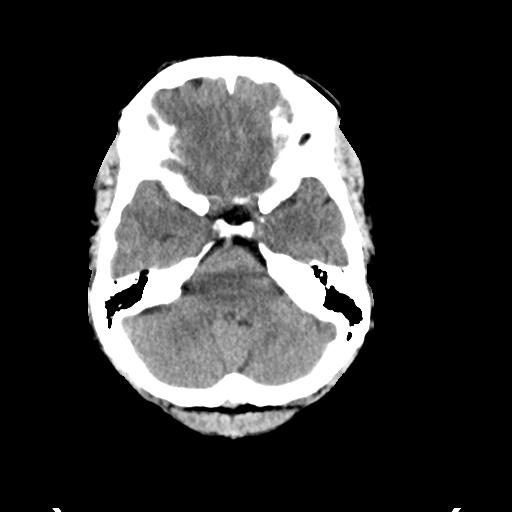
[im 9/30  brain]
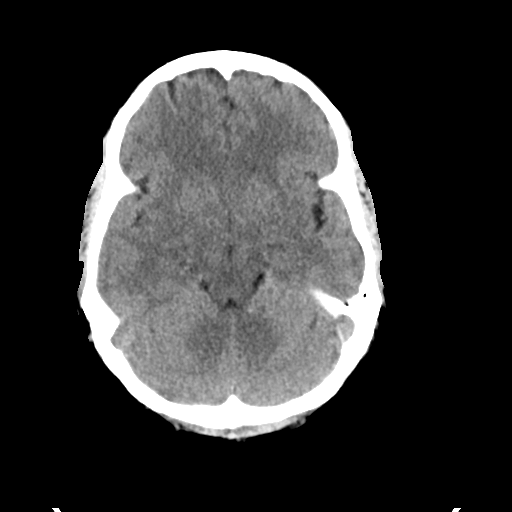
[im 12/30  brain]
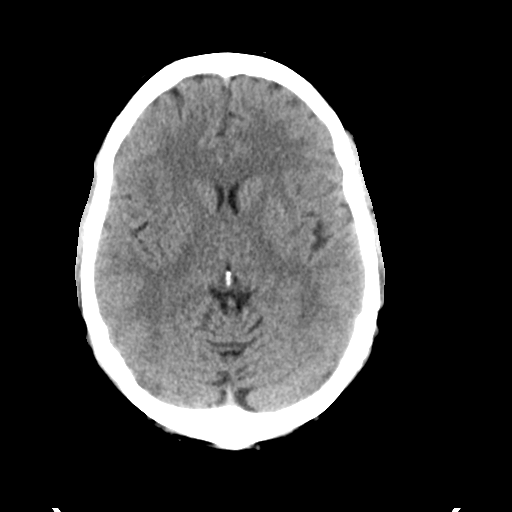
[im 16/30  brain]
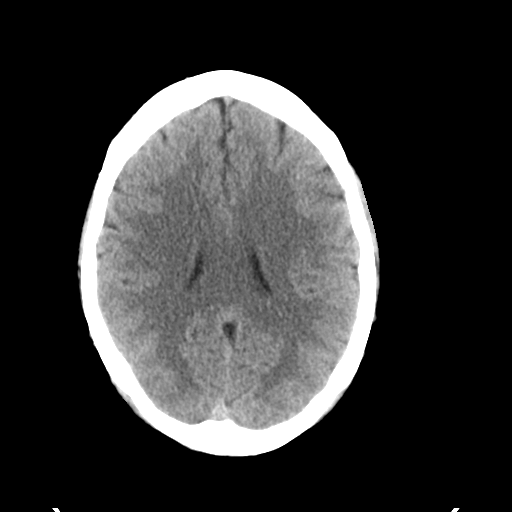
[im 16/30  bone]
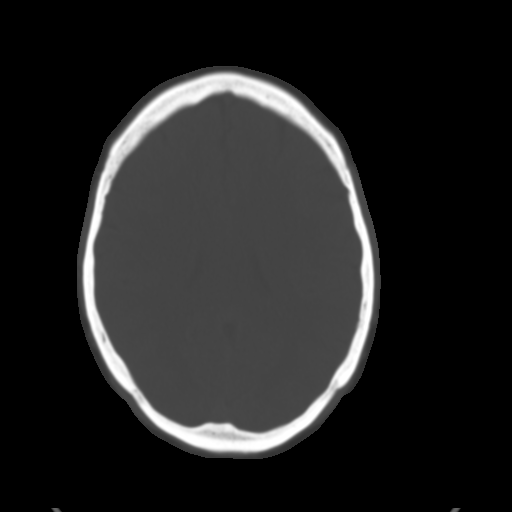
[im 19/30  brain]
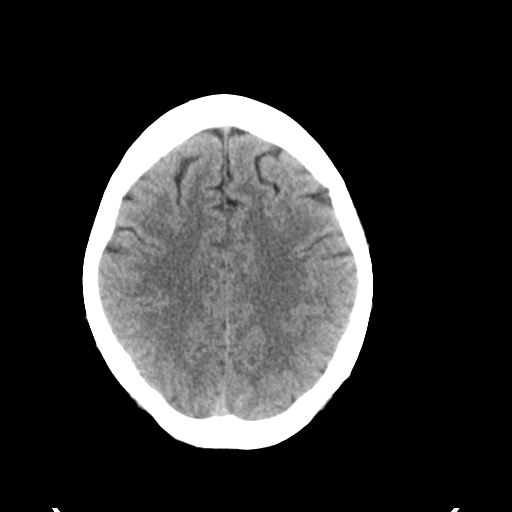
[im 22/30  brain]
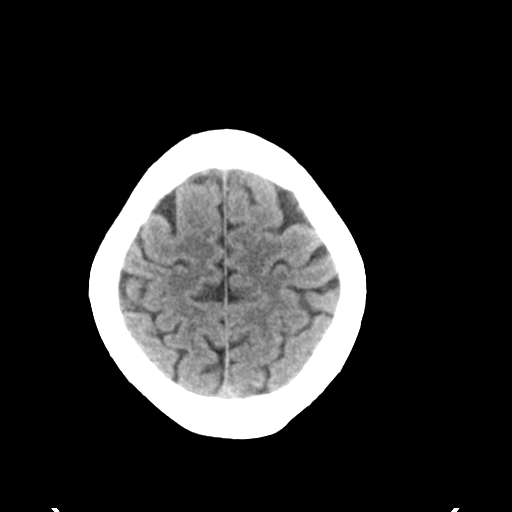
[im 25/30  brain]
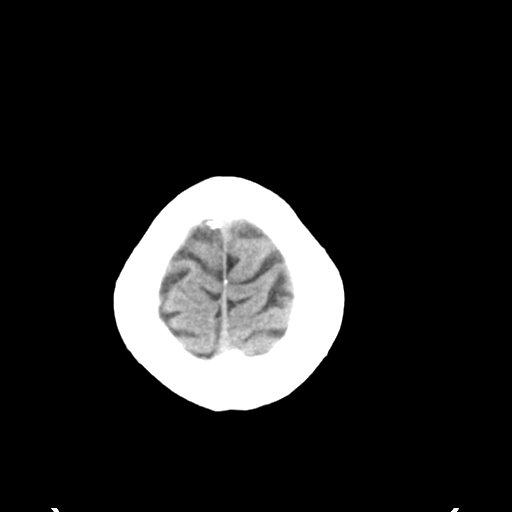
[im 28/30  brain]
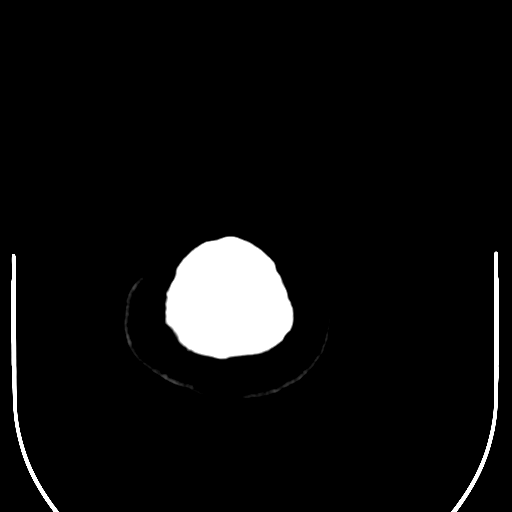
[im 28/30  bone]
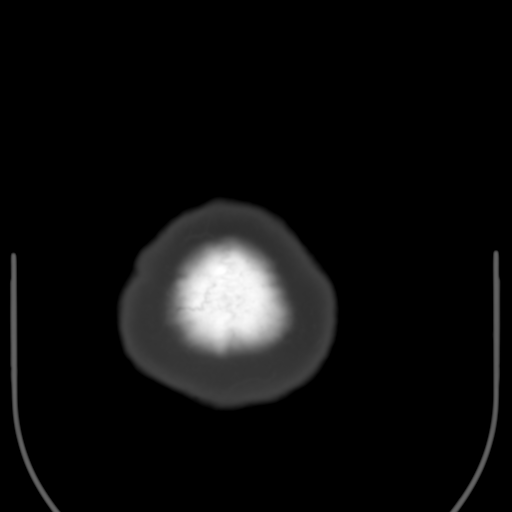

[Series 4: coronal soft tissue · coronal · 0.30mm/px · 3 of 62 slices shown]
[im 21/62  brain]
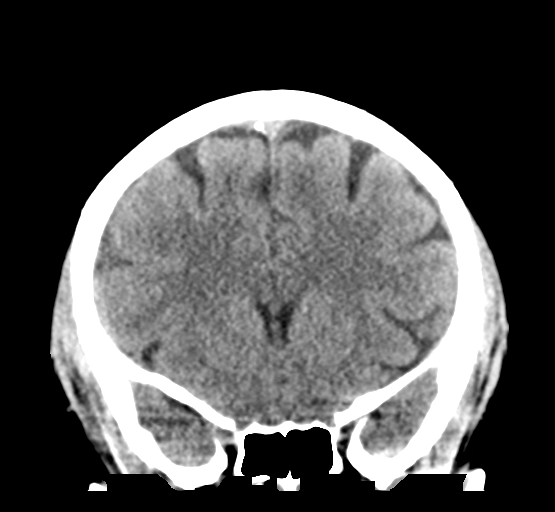
[im 28/62  brain]
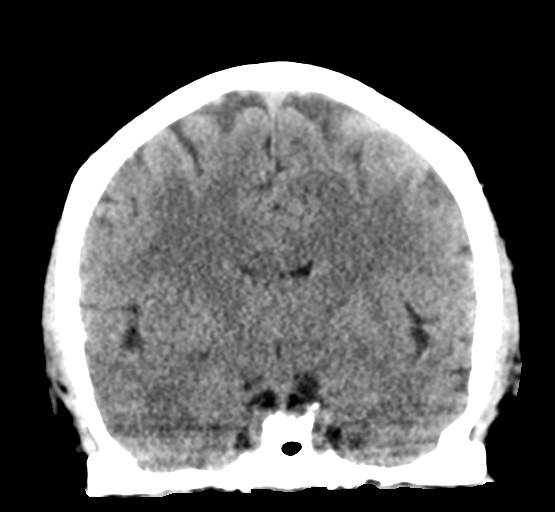
[im 34/62  brain]
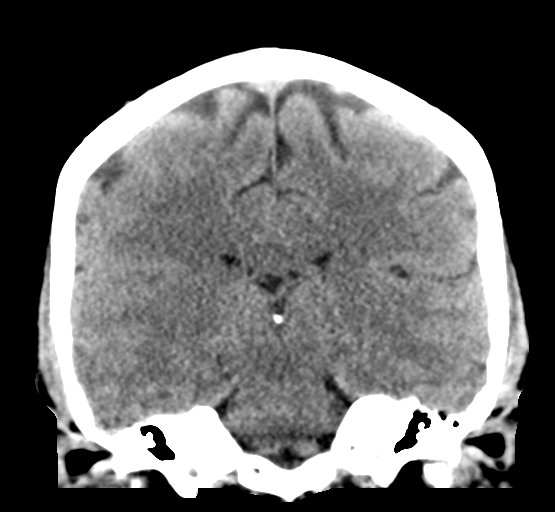

[Series 5: sagittal soft tissue · sagittal · 0.30mm/px · 3 of 53 slices shown]
[im 18/53  brain]
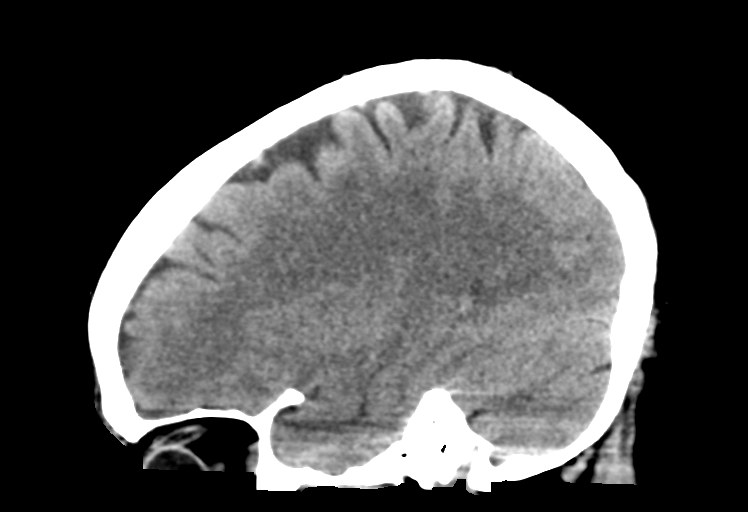
[im 27/53  brain]
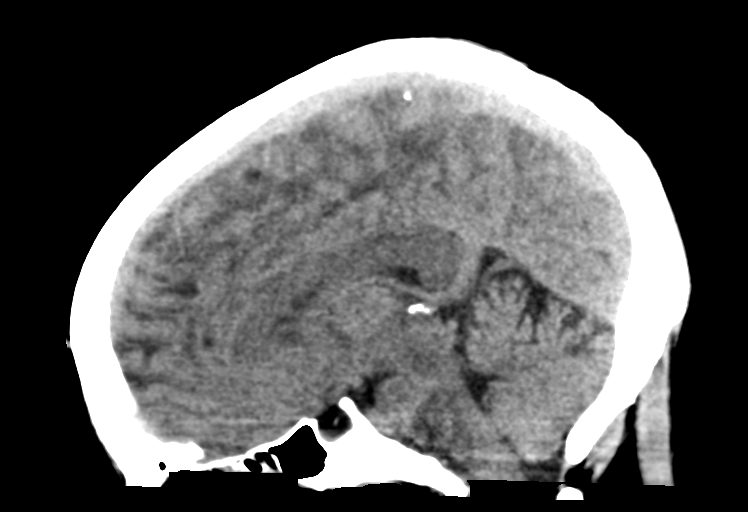
[im 35/53  brain]
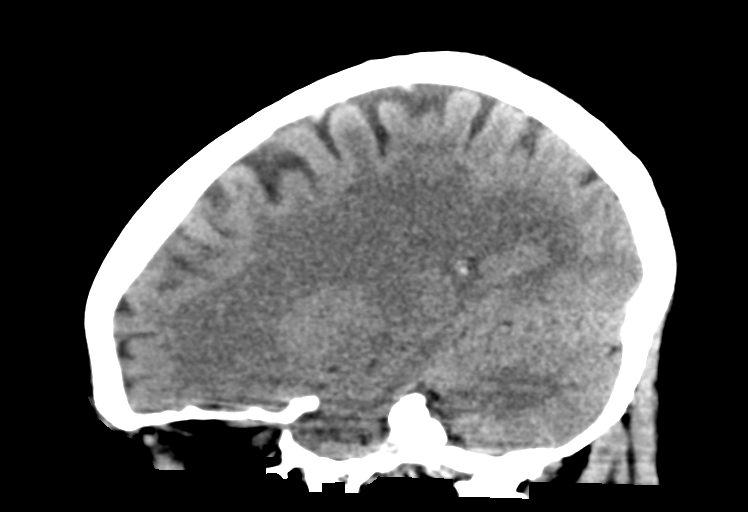

[15 of 47 positions shown; findings below may reference images not displayed]

FINDINGS: Brain: No mass lesion, intraparenchymal hemorrhage or extra-axial
collection. No evidence of acute cortical infarct. Brain parenchyma
and CSF-containing spaces are normal for age.

Vascular: No hyperdense vessel or unexpected calcification.

Skull: Normal visualized skull base, calvarium and extracranial soft
tissues.

Sinuses/Orbits: No sinus fluid levels or advanced mucosal
thickening. No mastoid effusion. Normal orbits.

ASPECTS (Alberta Stroke Program Early CT Score)

- Ganglionic level infarction (caudate, lentiform nuclei, internal
capsule, insula, M1-M3 cortex): 7

- Supraganglionic infarction (M4-M6 cortex): 3

Total score (0-10 with 10 being normal): 10
IMPRESSION: 1. No acute intracranial abnormality.  Normal head CT.
2. ASPECTS is 10.

These results were called by telephone at the time of interpretation
on 03/31/2016 at [DATE] to Dr. MONTAGNE PARROT , who verbally
acknowledged these results.

## 2017-03-01 IMAGING — DX DG PELVIS 1-2V
2 series · 2 of 2 positions shown · non-contrast
Comparison: None.

CLINICAL DATA: Status post fall.  Left hip pain.

EXAM:
PELVIS - 1-2 VIEW

[pelvis ap]
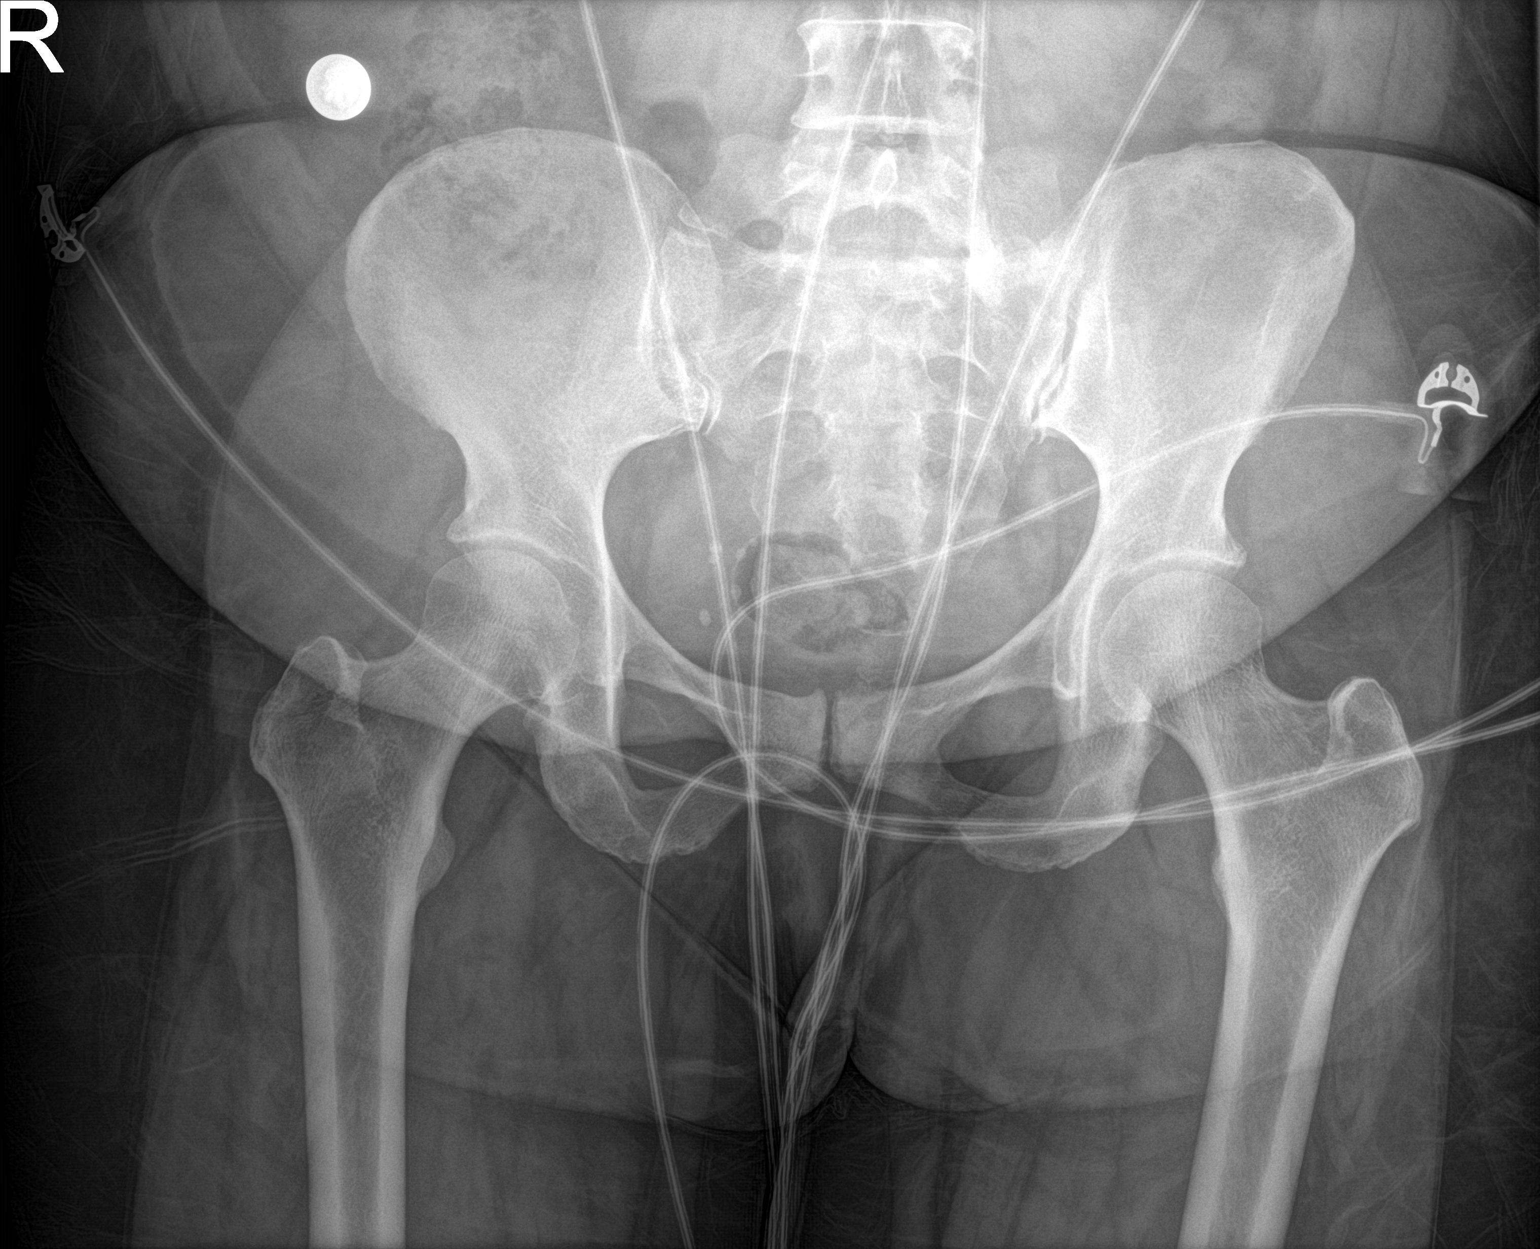

[pelvis lat]
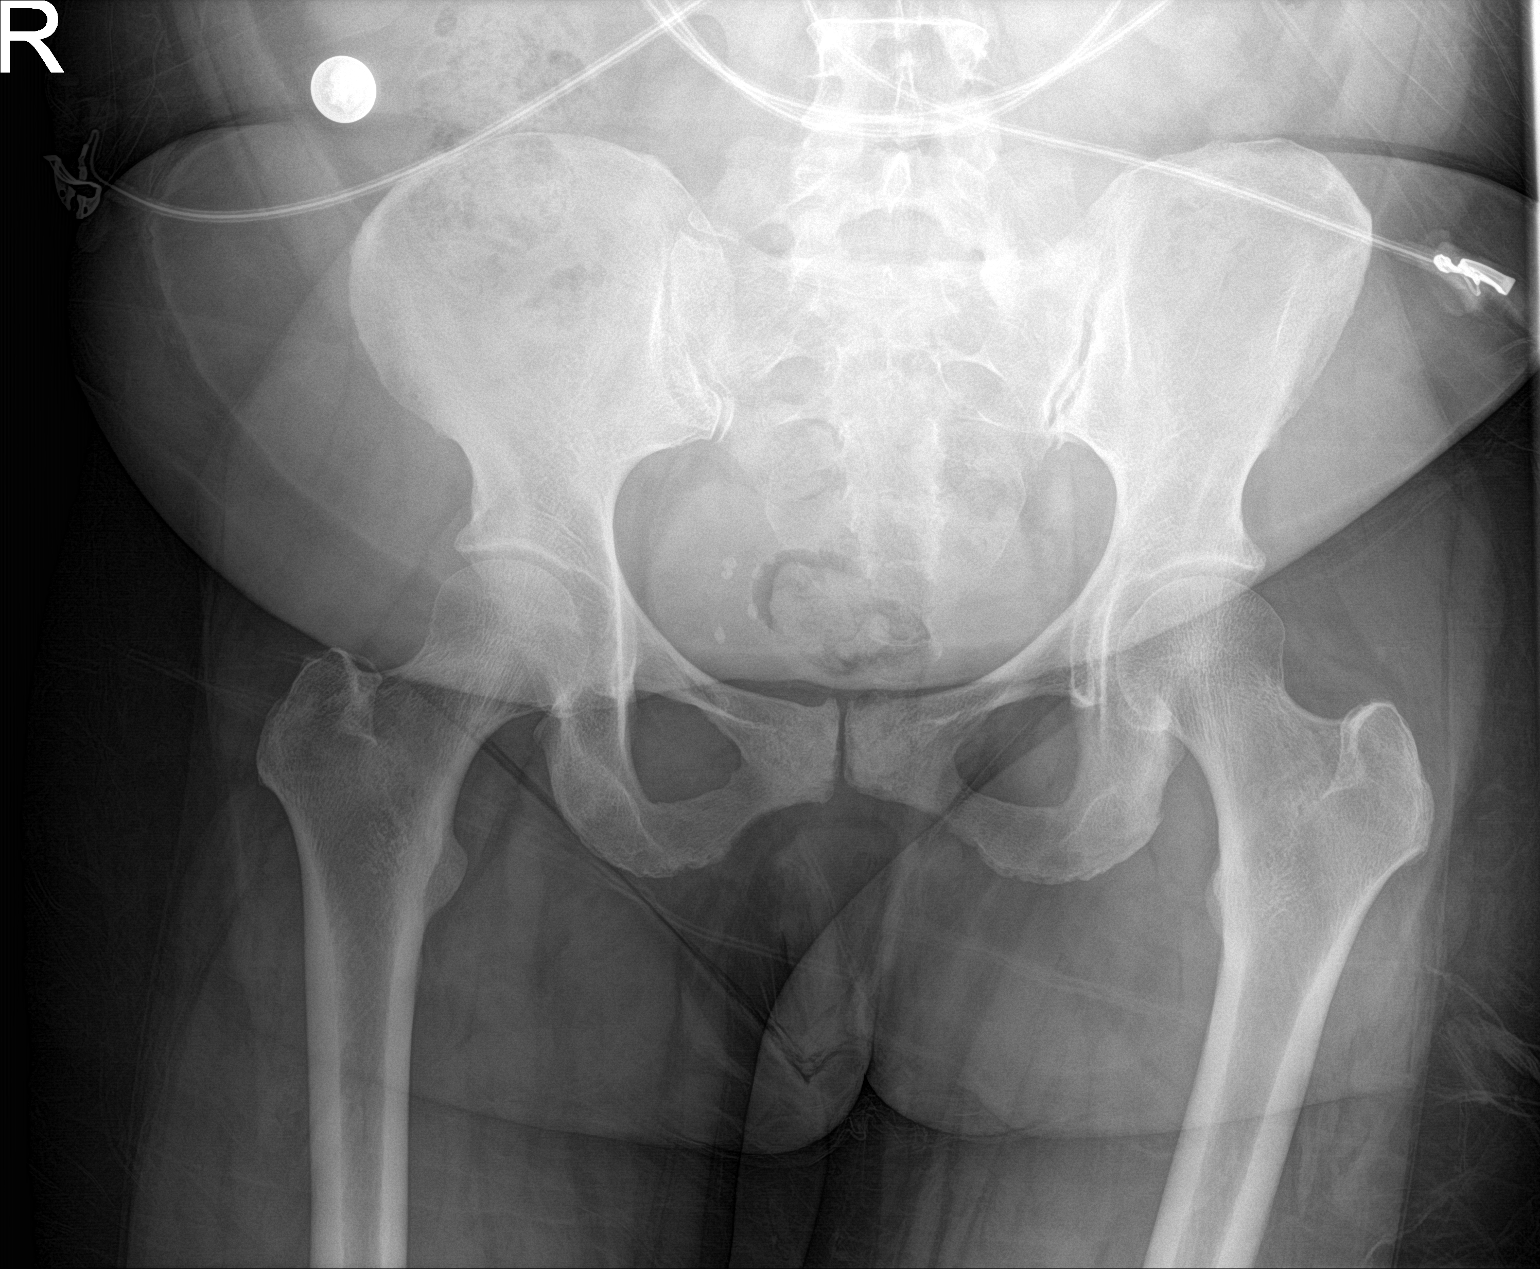

[2 of 2 positions shown; findings below may reference images not displayed]

FINDINGS: There is no evidence of pelvic fracture or diastasis. No pelvic bone
lesions are seen. There is transitional lumbosacral anatomy with
suspected sacralization of the L5 vertebral body.
IMPRESSION: No acute fracture of the pelvis.

## 2017-06-09 ENCOUNTER — Ambulatory Visit: Payer: Self-pay | Attending: Oncology

## 2017-06-10 ENCOUNTER — Other Ambulatory Visit: Payer: Medicaid Other

## 2017-06-19 ENCOUNTER — Encounter: Payer: Self-pay | Admitting: *Deleted

## 2017-06-19 ENCOUNTER — Ambulatory Visit: Payer: Self-pay | Admitting: General Surgery

## 2017-07-25 ENCOUNTER — Other Ambulatory Visit: Payer: Self-pay | Admitting: Adult Health Nurse Practitioner

## 2017-07-28 ENCOUNTER — Other Ambulatory Visit: Payer: Self-pay

## 2017-08-07 ENCOUNTER — Ambulatory Visit
Admission: RE | Admit: 2017-08-07 | Discharge: 2017-08-07 | Disposition: A | Discharge status: Home / Self Care | Payer: Medicaid Other | Source: Ambulatory Visit | Attending: Oncology | Admitting: Oncology

## 2017-08-07 DIAGNOSIS — D242 Benign neoplasm of left breast: Secondary | ICD-10-CM

## 2017-08-08 NOTE — Progress Notes (Addendum)
Phoned patient with [facility] 2 mammogram results.  Scheduled for annual [facility] visit 02/11/18 at 1:00.  Will check with Dr. Grayland Ormond regarding biopsy findings.  Patient was followed by Dr. Jamal Collin previously with notes stating she is a high risk patient .  She denies family history of breast cancer.   Copy to [facility].

## 2018-02-11 ENCOUNTER — Ambulatory Visit
Admission: RE | Admit: 2018-02-11 | Discharge: 2018-02-11 | Disposition: A | Discharge status: Home / Self Care | Payer: Medicaid Other | Source: Ambulatory Visit | Attending: Oncology | Admitting: Oncology

## 2018-02-11 ENCOUNTER — Ambulatory Visit: Payer: Medicaid Other | Attending: Oncology

## 2018-02-11 ENCOUNTER — Encounter (INDEPENDENT_AMBULATORY_CARE_PROVIDER_SITE_OTHER): Payer: Self-pay

## 2018-02-11 VITALS — BP 116/73 | HR 80 | Temp 98.4°F | Ht 64.0 in | Wt 190.0 lb

## 2018-02-11 DIAGNOSIS — Z0000 Encounter for general adult medical examination without abnormal findings: Secondary | ICD-10-CM | POA: Insufficient documentation

## 2018-02-11 NOTE — Progress Notes (Signed)
  Subjective:     Patient ID: Jocelyn Simon, female   DOB: [birth year], 52 y.o.   MRN: [id number]  HPI   Review of Systems     Objective:   Physical Exam  Pulmonary/Chest: Right breast exhibits no inverted nipple, no mass, no nipple discharge, no skin change and no tenderness. Left breast exhibits no inverted nipple, no mass, no nipple discharge, no skin change and no tenderness. Breasts are symmetrical.  Left breast biopsy scar         Assessment:     52 year old patient returns for annual [facility] clinic visit.  Patient screened, and meets [facility] eligibility.  Patient does not have insurance, Medicare or Medicaid.  Handout given on Affordable Care Act.  Instructed patient on breast self awareness using teach back method.  Clinical breast exam unremarkable.  No mass or lump palpated.  Patient informed lung CT screening eligibility begins at 53 years old.    Plan:     Sent for bilateral screening mammogram.

## 2018-02-18 NOTE — Progress Notes (Signed)
Letter mailed from [facility] to notify of normal mammogram results.  Patient to return in one year for annual screening.  Copy to [facility]. 

## 2018-04-12 ENCOUNTER — Emergency Department
Admission: EM | Admit: 2018-04-12 | Discharge: 2018-04-12 | Disposition: A | Discharge status: Home / Self Care | Payer: Medicaid Other | Attending: Emergency Medicine | Admitting: Emergency Medicine

## 2018-04-12 ENCOUNTER — Emergency Department: Payer: Medicaid Other

## 2018-04-12 ENCOUNTER — Other Ambulatory Visit: Payer: Self-pay

## 2018-04-12 DIAGNOSIS — Z79899 Other long term (current) drug therapy: Secondary | ICD-10-CM | POA: Insufficient documentation

## 2018-04-12 DIAGNOSIS — G43109 Migraine with aura, not intractable, without status migrainosus: Secondary | ICD-10-CM | POA: Insufficient documentation

## 2018-04-12 DIAGNOSIS — J449 Chronic obstructive pulmonary disease, unspecified: Secondary | ICD-10-CM | POA: Insufficient documentation

## 2018-04-12 DIAGNOSIS — I10 Essential (primary) hypertension: Secondary | ICD-10-CM | POA: Insufficient documentation

## 2018-04-12 DIAGNOSIS — R0789 Other chest pain: Secondary | ICD-10-CM | POA: Insufficient documentation

## 2018-04-12 DIAGNOSIS — F17210 Nicotine dependence, cigarettes, uncomplicated: Secondary | ICD-10-CM | POA: Diagnosis not present

## 2018-04-12 DIAGNOSIS — Z7902 Long term (current) use of antithrombotics/antiplatelets: Secondary | ICD-10-CM | POA: Insufficient documentation

## 2018-04-12 DIAGNOSIS — Z7982 Long term (current) use of aspirin: Secondary | ICD-10-CM | POA: Insufficient documentation

## 2018-04-12 DIAGNOSIS — R51 Headache: Secondary | ICD-10-CM | POA: Diagnosis present

## 2018-04-12 LAB — URINE DRUG SCREEN, QUALITATIVE (ARMC ONLY)
Amphetamines, Ur Screen: NOT DETECTED
Barbiturates, Ur Screen: NOT DETECTED
Benzodiazepine, Ur Scrn: NOT DETECTED
Cannabinoid 50 Ng, Ur ~~LOC~~: NOT DETECTED
Cocaine Metabolite,Ur ~~LOC~~: NOT DETECTED
MDMA (Ecstasy)Ur Screen: NOT DETECTED
Methadone Scn, Ur: NOT DETECTED
Opiate, Ur Screen: NOT DETECTED
Phencyclidine (PCP) Ur S: NOT DETECTED
Tricyclic, Ur Screen: NOT DETECTED

## 2018-04-12 LAB — COMPREHENSIVE METABOLIC PANEL WITH GFR
ALT: 30 U/L (ref 0–44)
AST: 25 U/L (ref 15–41)
Albumin: 4.7 g/dL (ref 3.5–5.0)
Alkaline Phosphatase: 63 U/L (ref 38–126)
Anion gap: 9 (ref 5–15)
BUN: 22 mg/dL — ABNORMAL HIGH (ref 6–20)
CO2: 28 mmol/L (ref 22–32)
Calcium: 9.3 mg/dL (ref 8.9–10.3)
Chloride: 101 mmol/L (ref 98–111)
Creatinine, Ser: 0.83 mg/dL (ref 0.44–1.00)
GFR calc Af Amer: >60 mL/min
GFR calc non Af Amer: >60 mL/min
Glucose, Bld: 95 mg/dL (ref 70–99)
Potassium: 3.7 mmol/L (ref 3.5–5.1)
Sodium: 138 mmol/L (ref 135–145)
Total Bilirubin: 0.4 mg/dL (ref 0.3–1.2)
Total Protein: 8.4 g/dL — ABNORMAL HIGH (ref 6.5–8.1)

## 2018-04-12 LAB — CBC
HCT: 41 % (ref 36.0–46.0)
Hemoglobin: 13.3 g/dL (ref 12.0–15.0)
MCH: 28.5 pg (ref 26.0–34.0)
MCHC: 32.4 g/dL (ref 30.0–36.0)
MCV: 87.8 fL (ref 80.0–100.0)
Platelets: 286 10*3/uL (ref 150–400)
RBC: 4.67 MIL/uL (ref 3.87–5.11)
RDW: 13.7 % (ref 11.5–15.5)
WBC: 4.6 10*3/uL (ref 4.0–10.5)
nRBC: 0 % (ref 0.0–0.2)

## 2018-04-12 LAB — DIFFERENTIAL
Abs Immature Granulocytes: 0.02 10*3/uL (ref 0.00–0.07)
Basophils Absolute: 0 10*3/uL (ref 0.0–0.1)
Basophils Relative: 1 %
Eosinophils Absolute: 0.3 10*3/uL (ref 0.0–0.5)
Eosinophils Relative: 6 %
Immature Granulocytes: 0 %
Lymphocytes Relative: 37 %
Lymphs Abs: 1.7 10*3/uL (ref 0.7–4.0)
Monocytes Absolute: 0.7 10*3/uL (ref 0.1–1.0)
Monocytes Relative: 14 %
Neutro Abs: 1.9 10*3/uL (ref 1.7–7.7)
Neutrophils Relative %: 42 %

## 2018-04-12 LAB — URINALYSIS, ROUTINE W REFLEX MICROSCOPIC
Bacteria, UA: NONE SEEN
Bilirubin Urine: NEGATIVE
Glucose, UA: NEGATIVE mg/dL
Hgb urine dipstick: NEGATIVE
Ketones, ur: NEGATIVE mg/dL
Leukocytes, UA: NEGATIVE
Nitrite: NEGATIVE
Protein, ur: NEGATIVE mg/dL
Specific Gravity, Urine: 1.01 (ref 1.005–1.030)
pH: 6 (ref 5.0–8.0)

## 2018-04-12 LAB — ETHANOL: Alcohol, Ethyl (B): <10 mg/dL (ref <10)

## 2018-04-12 LAB — APTT: aPTT: 28 s (ref 24–36)

## 2018-04-12 LAB — TROPONIN I: Troponin I: <0.03 ng/mL (ref <0.03)

## 2018-04-12 LAB — PROTIME-INR
INR: 0.88
Prothrombin Time: 11.9 s (ref 11.4–15.2)

## 2018-04-12 LAB — GLUCOSE, CAPILLARY: GLUCOSE-CAPILLARY: 91 mg/dL (ref 70–99)

## 2018-04-12 MED ORDER — SILVER SULFADIAZINE 1 % EX CREA
TOPICAL_CREAM | CUTANEOUS | Status: AC
  Filled 2018-04-12: qty 85

## 2018-04-12 MED ORDER — DIPHENHYDRAMINE HCL 50 MG/ML IJ SOLN
25.0000 mg | Freq: Once | INTRAMUSCULAR | Status: AC
  Administered 2018-04-12: 25 mg via INTRAVENOUS
  Filled 2018-04-12: qty 1

## 2018-04-12 MED ORDER — BUTALBITAL-APAP-CAFFEINE 50-325-40 MG PO TABS: 1.0000 | ORAL_TABLET | Freq: Four times a day (QID) | ORAL | 0 refills | Status: DC | PRN

## 2018-04-12 MED ORDER — PROCHLORPERAZINE EDISYLATE 10 MG/2ML IJ SOLN
10.0000 mg | Freq: Once | INTRAMUSCULAR | Status: AC
  Administered 2018-04-12: 10 mg via INTRAVENOUS
  Filled 2018-04-12: qty 2

## 2018-04-12 MED ORDER — KETOROLAC TROMETHAMINE 30 MG/ML IJ SOLN
30.0000 mg | Freq: Once | INTRAMUSCULAR | Status: AC
  Administered 2018-04-12: 30 mg via INTRAVENOUS
  Filled 2018-04-12: qty 1

## 2018-04-12 MED ORDER — SILVER SULFADIAZINE 1 % EX CREA
TOPICAL_CREAM | Freq: Once | CUTANEOUS | Status: AC
  Administered 2018-04-12: 23:00:00 via TOPICAL

## 2018-04-12 MED ORDER — SODIUM CHLORIDE 0.9 % IV BOLUS
1000.0000 mL | Freq: Once | INTRAVENOUS | Status: AC
  Administered 2018-04-12: 1000 mL via INTRAVENOUS

## 2018-04-12 NOTE — ED Provider Notes (Signed)
[facility] Emergency Department Provider Note ____________________________________________   First MD Initiated Contact with Patient 04/12/18 2030     (approximate)  I have reviewed the triage vital signs and the nursing notes.   HISTORY  Chief Complaint Chest Pain and Eye Pain   HPI Jocelyn Simon is a 52 y.o. female with a history of COPD stroke as well as anxiety who is present to the emergency department today complaining of almost 24 hours of 10 out of 10 pressure behind her eyes bilaterally, light sensitivity as well as right-sided weakness.  She says that the symptoms have all been worsening since approximately 11 PM yesterday.  Says that she has a history of stroke.  Says that she also has associated nausea and vomiting.  Patient also complaining of dull left-sided chest pain which she says is now was completely resolved.  Denying any shortness of breath.  Past Medical History:  Diagnosis Date  . Anxiety   . Asthma   . COPD (chronic obstructive pulmonary disease) ([place])   . Dyspnea    with exertion  . GERD (gastroesophageal reflux disease)   . Headache   . Hyperlipidemia   . Hypertension   . Stroke ([place])   . Vertigo     Patient Active Problem List   Diagnosis Date Noted  . Chronic sore throat 09/12/2016  . Essential hypertension 07/18/2016  . Hyperlipidemia 07/18/2016  . Gastroesophageal reflux disease without esophagitis 07/18/2016  . Chronic obstructive pulmonary disease ([place]) 07/18/2016  . Pelvic pain in female 12/20/2015  . Prediabetes 09/19/2015  . Left-sided weakness 04/07/2015    Past Surgical History:  Procedure Laterality Date  . BREAST BIOPSY Left 12/10/2016   papilloma  . BREAST EXCISIONAL BIOPSY Left 01/07/2017   excision of papilloma  . BREAST LUMPECTOMY WITH NEEDLE LOCALIZATION Left 01/07/2017   Procedure: BREAST LUMPECTOMY WITH NEEDLE LOCALIZATION;  Surgeon: Christene Lye, MD;  Location: [facility] ORS;   Service: General;  Laterality: Left;  . LAPAROSCOPIC SALPINGO OOPHERECTOMY Bilateral 12/20/2015   Procedure: LAPAROSCOPIC BILATERAL SALPINGO OOPHORECTOMY WITH PELVIC WASHINGS;  Surgeon: Benjaman Kindler, MD;  Location: [facility] ORS;  Service: Gynecology;  Laterality: Bilateral;  . TUBAL LIGATION      Prior to Admission medications   Medication Sig Start Date End Date Taking? Authorizing Provider  albuterol (PROVENTIL) (2.5 MG/3ML) 0.083% nebulizer solution Take 2.5 mg by nebulization every 6 (six) hours as needed for wheezing or shortness of breath.    [provider]  albuterol (VENTOLIN HFA) 108 (90 Base) MCG/ACT inhaler INHALE 2 PUFFS EVERY SIX HOURS AS NEEDED FOR COUGH, WHEEZING OR SHORTNESS OF BREATH. 08/15/16   Doles-Johnson, Teah, NP  amLODipine (NORVASC) 5 MG tablet Take 1 tablet (5 mg total) by mouth daily. Patient taking differently: Take 5 mg by mouth at bedtime.  08/15/16   Doles-Johnson, Teah, NP  amoxicillin-clavulanate (AUGMENTIN) 875-125 MG tablet Take 1 tablet by mouth 2 (two) times daily. For 14 days, started 12/23/16-01/06/17 12/23/16   [provider]  aspirin EC 81 MG tablet Take 1 tablet (81 mg total) by mouth daily. 02/15/16   Juluis Pitch, MD  atorvastatin (LIPITOR) 20 MG tablet Take 1 tablet (20 mg total) by mouth daily. Patient taking differently: Take 20 mg by mouth daily at 6 PM.  07/18/16   Doles-Johnson, Teah, NP  clopidogrel (PLAVIX) 75 MG tablet Take 1 tablet (75 mg total) by mouth daily. 04/01/16   Henreitta Leber, MD  estradiol (ESTRACE) 1 MG tablet Take 1  mg by mouth daily.    [provider]  fluticasone (FLONASE) 50 MCG/ACT nasal spray Place 2 sprays into both nostrils daily. 12/23/16   [provider]  guaiFENesin-dextromethorphan (ROBITUSSIN DM) 100-10 MG/5ML syrup Take 5 mLs by mouth every 4 (four) hours as needed for cough. 08/15/16   Doles-Johnson, Teah, NP  hydrochlorothiazide (HYDRODIURIL) 25 MG tablet Take 1 tablet (25 mg total)  by mouth daily. 07/18/16   Doles-Johnson, Teah, NP  ibuprofen (ADVIL,MOTRIN) 800 MG tablet Take 1 tablet (800 mg total) by mouth every 8 (eight) hours as needed for moderate pain. 07/18/16   Doles-Johnson, Teah, NP  meclizine (ANTIVERT) 25 MG tablet TAKE ONE TABLET BY MOUTH 3 TIMES A DAY. 07/15/16   Tawni Millers, MD  montelukast (SINGULAIR) 10 MG tablet Take 1 tablet (10 mg total) by mouth at bedtime. 08/15/16   Doles-Johnson, Teah, NP  ondansetron (ZOFRAN) 4 MG tablet Take 1 tablet (4 mg total) by mouth every 8 (eight) hours as needed for nausea or vomiting. 08/15/16   Doles-Johnson, Teah, NP  pantoprazole (PROTONIX) 40 MG tablet Take 1 tablet (40 mg total) by mouth daily. 02/15/16   Juluis Pitch, MD  progesterone (PROMETRIUM) 100 MG capsule Take 100 mg by mouth daily.    [provider]  traMADol (ULTRAM) 50 MG tablet Take 1 tablet (50 mg total) by mouth every 6 (six) hours as needed. 01/07/17   Christene Lye, MD    Allergies Patient has no known allergies.  Family History  Problem Relation Age of Onset  . CAD Mother   . CAD Sister   . CVA Other   . Breast cancer Neg Hx     Social History Social History   Tobacco Use  . Smoking status: Current Every Day Smoker    Packs/day: 0.25    Types: Cigarettes  . Smokeless tobacco: Never Used  . Tobacco comment: interested in restarting Nicotrol inhalers   Substance Use Topics  . Alcohol use: No  . Drug use: No    Review of Systems  Constitutional: No fever/chills Eyes: No visual changes. ENT: No sore throat. Cardiovascular: As above Respiratory: Denies shortness of breath. Gastrointestinal: No abdominal pain.   No diarrhea.  No constipation. Genitourinary: Negative for dysuria. Musculoskeletal: Negative for back pain. Skin: Negative for rash. Neurological: As above   ____________________________________________   PHYSICAL EXAM:  VITAL SIGNS: ED Triage Vitals  Enc Vitals Group     BP 04/12/18 2021  126/80     Pulse Rate 04/12/18 2021 98     Resp 04/12/18 2021 19     Temp 04/12/18 2021 99.5 F (37.5 C)     Temp Source 04/12/18 2021 Oral     SpO2 04/12/18 2021 95 %     Weight 04/12/18 2019 180 lb (81.6 kg)     Height 04/12/18 2019 5\' 1"  (1.549 m)     Head Circumference --      Peak Flow --      Pain Score 04/12/18 2019 10     Pain Loc --      Pain Edu? --      Excl. in [place]? --     Constitutional: Alert and oriented.  Patient appears uncomfortable.  Covering eyes to shield them from the light. Eyes: Conjunctivae are normal.  Pupils are 3 mm and reactive bilaterally.  EOMI. Head: Atraumatic. Nose: No congestion/rhinnorhea. Mouth/Throat: Mucous membranes are moist.  Neck: No stridor.   Cardiovascular: Normal rate, regular rhythm. Grossly normal  heart sounds.  Good peripheral circulation with equal and bilateral radial as well as dorsalis pedis pulses. Respiratory: Normal respiratory effort.  No retractions. Lungs CTAB. Gastrointestinal: Soft and nontender. No distention. No CVA tenderness. Musculoskeletal: No lower extremity tenderness nor edema.  No joint effusions. Neurologic:    Normal speech and language.  4 out of 5 right sided strength compared to 5 out of 5 left-sided strength the patient says is new.  Skin:  Skin is warm, dry and intact. No rash noted. Psychiatric: Mood and affect are normal. Speech and behavior are normal.  ____________________________________________   LABS (all labs ordered are listed, but only abnormal results are displayed)  Labs Reviewed  COMPREHENSIVE METABOLIC PANEL - Abnormal; Notable for the following components:      Result Value   BUN 22 (*)    Total Protein 8.4 (*)    All other components within normal limits  URINALYSIS, ROUTINE W REFLEX MICROSCOPIC - Abnormal; Notable for the following components:   Color, Urine YELLOW (*)    APPearance CLEAR (*)    All other components within normal limits  GLUCOSE, CAPILLARY  ETHANOL    PROTIME-INR  APTT  CBC  DIFFERENTIAL  TROPONIN I  URINE DRUG SCREEN, QUALITATIVE ([facility] ONLY)   ____________________________________________  EKG  ED ECG REPORT I, Doran Stabler, the attending physician, personally viewed and interpreted this ECG.   Date: 04/12/2018  EKG Time: 2014  Rate: 100  Rhythm: normal sinus rhythm  Axis: Normal  Intervals:none  ST&T Change: No ST segment elevation or depression.  No abnormal T wave inversion.  ____________________________________________  RADIOLOGY  CT head without any acute pathology. ____________________________________________   PROCEDURES  Procedure(s) performed:   Procedures  Critical Care performed:   ____________________________________________   INITIAL IMPRESSION / ASSESSMENT AND PLAN / ED COURSE  Pertinent labs & imaging results that were available during my care of the patient were reviewed by me and considered in my medical decision making (see chart for details).  Differential diagnosis includes, but is not limited to, intracranial hemorrhage, meningitis/encephalitis, previous head trauma, cavernous venous thrombosis, tension headache, temporal arteritis, migraine or migraine equivalent, idiopathic intracranial hypertension, and non-specific headache, cva Differential diagnosis includes, but is not limited to, ACS, aortic dissection, pulmonary embolism, cardiac tamponade, pneumothorax, pneumonia, pericarditis, myocarditis, GI-related causes including esophagitis/gastritis, and musculoskeletal chest wall pain.   As part of my medical decision making, I reviewed the following data within the electronic MEDICAL RECORD NUMBER Notes from prior outpatient visits  ----------------------------------------- 11:39 PM on 04/12/2018 -----------------------------------------  Patient originally made a stroke alert secondary to within 24-hour window with weakness and visual disturbance.  I also discussed the case with Dr.  Wendee Copp of neurology who evaluated the patient is a stroke alert and did not recommend TPA but did recommend admission and further work-up including an MRI.  Patient given migraine treatment and symptoms completely resolved.  Refusing MRI saying that she is very claustrophobic and that she would like to go home.  Patient with symptoms that are completely resolved at this time.  Negative neuro exam currently for any deficits.  No longer with photophobia or chest pain.  Reassuring labs as well as imaging.  Patient understands we are unable to completely rule out a role in stroke without the MRI.  However, she understands that she may come back anytime for further evaluation and treatment.  To be discharged at this time.  We will give Fioricet as well as referral for neurology.  Patient understanding  of the diagnosis as well as treatment and willing to comply. ____________________________________________   FINAL CLINICAL IMPRESSION(S) / ED DIAGNOSES  Complicated migraine.  NEW MEDICATIONS STARTED DURING THIS VISIT:  New Prescriptions   No medications on file     Note:  This document was prepared using Dragon voice recognition software and may include unintentional dictation errors.     Orbie Pyo, MD 04/12/18 [phone]

## 2018-04-12 NOTE — ED Notes (Signed)
Peripheral IV discontinued. Catheter intact. No signs of infiltration or redness. Gauze applied to IV site.   Discharge instructions reviewed with patient. Questions fielded by this RN. Patient verbalizes understanding of instructions. Patient discharged home in stable condition per schaevitz. No acute distress noted at time of discharge.

## 2018-04-12 NOTE — ED Notes (Signed)
Call from MRI to send pt, Jocelyn Simon, NT transporting att

## 2018-04-12 NOTE — ED Notes (Addendum)
Pt taken by MD and then to CT by Jan EDT

## 2018-04-12 NOTE — ED Notes (Signed)
Husband of pt states it was around 5:00 today when pt was last known well.

## 2018-04-12 NOTE — Progress Notes (Signed)
   04/12/18 2055  Clinical Encounter Type  Visited With Patient and family together  Visit Type Code (Code Stroke)  Referral From Nurse  Recommendations Follow-up, as requested.   Chaplain responded to Code Stroke and met the care team at the patient's room. After introductions to the patient and husband, the patient said that they did not need a chaplain at this time. Chaplain offered to return if needed.

## 2018-04-12 NOTE — ED Notes (Signed)
RN Margreta Journey notified of pt taken to CT and then coming to room 16. RN updated with brief report.

## 2018-04-12 NOTE — Consult Note (Signed)
   [facility] Tele[facility]urology Consult Services   Date of Service:   04/12/2018 20:54:14  Impression:     .  RO Acute Ischemic Stroke  Comments: headache, dizziness and right sided weakness, R/O stroke vs complex migraine  Metrics: Last Known Well: 04/11/2018 23:00:00 [facility] Notification Time: 04/12/2018 20:52:59 Arrival Time: 04/12/2018 20:12:00 Stamp Time: 04/12/2018 20:54:14 Time First Login Attempt: 04/12/2018 20:58:54 Video Start Time: 04/12/2018 20:58:54  Symptoms: pain behind her eye and right sided weakness NIHSS Start Assessment Time: 04/12/2018 21:00:28 Patient is not a candidate for tPA. Patient was not deemed candidate for tPA thrombolytics because of Last Well Known Above 4.5 Hours. Video End Time: 04/12/2018 21:07:57  CT head showed no acute hemorrhage or acute core infarct.  Advanced imaging was not obtained as the presentation was not suggestive of Large Vessel Occlusive Disease.  ER physician notified of the decision on thrombolytics management.  Our recommendations are outlined below.  Recommendations:     .  Start Antiplatelet Therapy Daily  Routine Consultation with [facility] for Follow up Care  Sign Out:     .  Discussed with Emergency Department Provider    ------------------------------------------------------------------------------  History of Present Illness: Patient is a 52 years old Female.  Patient was brought by EMS for symptoms of pain behind her eye and right sided weakness  52 YO F with h/o migraines, HTN, COPD and stroke presented with right sided weakness and pain behind both eyes worse on the right. she states that she started having these symptoms at 23:00 last night before she went to bed. reports nausea and dizziness.  CT head showed no acute hemorrhage or acute core infarct.    Examination: 1A: Level of Consciousness - Alert; keenly responsive + 0 1B: Ask Month and Age - Both Questions Right + 0 1C:  Blink Eyes & Squeeze Hands - Performs Both Tasks + 0 2: Test Horizontal Extraocular Movements - Normal + 0 3: Test Visual Fields - No Visual Loss + 0 4: Test Facial Palsy (Use Grimace if Obtunded) - Normal symmetry + 0 5A: Test Left Arm Motor Drift - No Drift for 10 Seconds + 0 5B: Test Right Arm Motor Drift - Drift, but doesn't hit bed + 1 6A: Test Left Leg Motor Drift - No Drift for 5 Seconds + 0 6B: Test Right Leg Motor Drift - Drift, but doesn't hit bed + 1 7: Test Limb Ataxia (FNF/Heel-Shin) - No Ataxia + 0 8: Test Sensation - Normal; No sensory loss + 0 9: Test Language/Aphasia - Normal; No aphasia + 0 10: Test Dysarthria - Normal + 0 11: Test Extinction/Inattention - No abnormality + 0  NIHSS Score: 2  Patient was informed the Neurology Consult would happen via TeleHealth consult by way of interactive audio and video telecommunications and consented to receiving care in this manner.  Due to the immediate potential for life-threatening deterioration due to underlying acute neurologic illness, I spent 35 minutes providing critical care. This time includes time for face to face visit via telemedicine, review of medical records, imaging studies and discussion of findings with providers, the patient and/or family.   Dr Beau Fanny   [facility] [phone]
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
## 2018-04-12 NOTE — ED Notes (Signed)
Patient denies pain and is resting comfortably.  

## 2018-04-12 NOTE — ED Notes (Signed)
Patient transported to MRI, pt reports ready to go home, EDP notified

## 2018-04-13 NOTE — ED Notes (Signed)
Pt refusing MRI

## 2018-07-26 ENCOUNTER — Emergency Department: Payer: Medicaid Other

## 2018-07-26 ENCOUNTER — Encounter: Payer: Self-pay | Admitting: Emergency Medicine

## 2018-07-26 ENCOUNTER — Emergency Department
Admission: EM | Admit: 2018-07-26 | Discharge: 2018-07-26 | Disposition: A | Discharge status: Home / Self Care | Payer: Medicaid Other | Attending: Student in an Organized Health Care Education/Training Program | Admitting: Student in an Organized Health Care Education/Training Program

## 2018-07-26 DIAGNOSIS — Z7982 Long term (current) use of aspirin: Secondary | ICD-10-CM | POA: Diagnosis not present

## 2018-07-26 DIAGNOSIS — J029 Acute pharyngitis, unspecified: Secondary | ICD-10-CM | POA: Diagnosis not present

## 2018-07-26 DIAGNOSIS — M542 Cervicalgia: Secondary | ICD-10-CM | POA: Insufficient documentation

## 2018-07-26 DIAGNOSIS — J45909 Unspecified asthma, uncomplicated: Secondary | ICD-10-CM | POA: Diagnosis not present

## 2018-07-26 DIAGNOSIS — I10 Essential (primary) hypertension: Secondary | ICD-10-CM | POA: Insufficient documentation

## 2018-07-26 DIAGNOSIS — Z7902 Long term (current) use of antithrombotics/antiplatelets: Secondary | ICD-10-CM | POA: Insufficient documentation

## 2018-07-26 DIAGNOSIS — R51 Headache: Secondary | ICD-10-CM | POA: Insufficient documentation

## 2018-07-26 DIAGNOSIS — Z79899 Other long term (current) drug therapy: Secondary | ICD-10-CM | POA: Insufficient documentation

## 2018-07-26 DIAGNOSIS — F17210 Nicotine dependence, cigarettes, uncomplicated: Secondary | ICD-10-CM | POA: Insufficient documentation

## 2018-07-26 LAB — CBC WITH DIFFERENTIAL/PLATELET
Abs Immature Granulocytes: 0.03 10*3/uL (ref 0.00–0.07)
BASOS ABS: 0.1 10*3/uL (ref 0.0–0.1)
Basophils Relative: 1 %
Eosinophils Absolute: 0.2 10*3/uL (ref 0.0–0.5)
Eosinophils Relative: 4 %
HCT: 36.3 % (ref 36.0–46.0)
Hemoglobin: 11.6 g/dL — ABNORMAL LOW (ref 12.0–15.0)
Immature Granulocytes: 1 %
Lymphocytes Relative: 40 %
Lymphs Abs: 2.4 10*3/uL (ref 0.7–4.0)
MCH: 28.1 pg (ref 26.0–34.0)
MCHC: 32 g/dL (ref 30.0–36.0)
MCV: 87.9 fL (ref 80.0–100.0)
Monocytes Absolute: 0.5 10*3/uL (ref 0.1–1.0)
Monocytes Relative: 9 %
NEUTROS PCT: 45 %
NRBC: 0 % (ref 0.0–0.2)
Neutro Abs: 2.8 10*3/uL (ref 1.7–7.7)
Platelets: 292 10*3/uL (ref 150–400)
RBC: 4.13 MIL/uL (ref 3.87–5.11)
RDW: 13.9 % (ref 11.5–15.5)
WBC: 6 10*3/uL (ref 4.0–10.5)

## 2018-07-26 LAB — COMPREHENSIVE METABOLIC PANEL
ALT: 30 U/L (ref 0–44)
AST: 25 U/L (ref 15–41)
Albumin: 4.2 g/dL (ref 3.5–5.0)
Alkaline Phosphatase: 47 U/L (ref 38–126)
Anion gap: 6 (ref 5–15)
BUN: 22 mg/dL — ABNORMAL HIGH (ref 6–20)
CO2: 25 mmol/L (ref 22–32)
Calcium: 8.9 mg/dL (ref 8.9–10.3)
Chloride: 109 mmol/L (ref 98–111)
Creatinine, Ser: 0.64 mg/dL (ref 0.44–1.00)
GFR calc non Af Amer: >60 mL/min (ref >60)
Glucose, Bld: 97 mg/dL (ref 70–99)
Potassium: 4.2 mmol/L (ref 3.5–5.1)
Sodium: 140 mmol/L (ref 135–145)
Total Bilirubin: 0.5 mg/dL (ref 0.3–1.2)
Total Protein: 7.5 g/dL (ref 6.5–8.1)

## 2018-07-26 MED ORDER — ONDANSETRON HCL 4 MG/2ML IJ SOLN
4.0000 mg | Freq: Once | INTRAMUSCULAR | Status: AC
  Administered 2018-07-26: 4 mg via INTRAVENOUS
  Filled 2018-07-26: qty 2

## 2018-07-26 MED ORDER — MORPHINE SULFATE (PF) 4 MG/ML IV SOLN
4.0000 mg | Freq: Once | INTRAVENOUS | Status: AC
  Administered 2018-07-26: 4 mg via INTRAVENOUS
  Filled 2018-07-26: qty 1

## 2018-07-26 MED ORDER — IOHEXOL 300 MG/ML  SOLN
75.0000 mL | Freq: Once | INTRAMUSCULAR | Status: AC | PRN
  Administered 2018-07-26: 75 mL via INTRAVENOUS
  Filled 2018-07-26: qty 75
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
## 2018-07-26 NOTE — ED Triage Notes (Addendum)
Patient presents to the ED with tenderness to the back of her head for greater than 1 month, pain in her left ear and left jaw and some pain with swallowing x 3 weeks.  Patient states the pain with swallowing has increased.  Patient is maintaining secretions at this time.  Speaking clearly in full sentences.

## 2018-07-26 NOTE — Discharge Instructions (Addendum)
Follow-up with [facility].  Please call for appointment.  Try staying very hydrated.  This will help with your aches and pains.  Also try over-the-counter Prilosec in case you have silent reflux which is causing throat pain.  Your CT the head, CT of your neck and throat, and your chest x-ray were all normal today.  Return if worsening

## 2018-11-30 ENCOUNTER — Emergency Department
Admission: EM | Admit: 2018-11-30 | Discharge: 2018-11-30 | Disposition: A | Discharge status: Home / Self Care | Payer: Medicaid Other | Attending: Emergency Medicine | Admitting: Emergency Medicine

## 2018-11-30 ENCOUNTER — Other Ambulatory Visit: Payer: Self-pay

## 2018-11-30 DIAGNOSIS — Z8673 Personal history of transient ischemic attack (TIA), and cerebral infarction without residual deficits: Secondary | ICD-10-CM | POA: Diagnosis not present

## 2018-11-30 DIAGNOSIS — J449 Chronic obstructive pulmonary disease, unspecified: Secondary | ICD-10-CM | POA: Insufficient documentation

## 2018-11-30 DIAGNOSIS — H6992 Unspecified Eustachian tube disorder, left ear: Secondary | ICD-10-CM

## 2018-11-30 DIAGNOSIS — H9202 Otalgia, left ear: Secondary | ICD-10-CM | POA: Diagnosis present

## 2018-11-30 DIAGNOSIS — I10 Essential (primary) hypertension: Secondary | ICD-10-CM | POA: Insufficient documentation

## 2018-11-30 DIAGNOSIS — H6982 Other specified disorders of Eustachian tube, left ear: Secondary | ICD-10-CM

## 2018-11-30 DIAGNOSIS — F17210 Nicotine dependence, cigarettes, uncomplicated: Secondary | ICD-10-CM | POA: Insufficient documentation

## 2018-11-30 MED ORDER — PREDNISONE 10 MG PO TABS: ORAL_TABLET | ORAL | 0 refills | Status: DC

## 2018-11-30 MED ORDER — FLUTICASONE PROPIONATE 50 MCG/ACT NA SUSP: 2.0000 | Freq: Every day | NASAL | 0 refills | Status: AC

## 2018-11-30 NOTE — Discharge Instructions (Signed)
Follow-up with your primary care provider or with [facility] if any continued problems with your ear.  Begin using Flonase nasal spray 2 sprays in each nostril 1 time a day.  The prednisone tablets are to taper down beginning with 6 tablets today, 5 tablets then 4 tablets until you are completely finished as labeled directs.  You may take Tylenol while taking this medication if needed for pain however discontinue taking ibuprofen at this time while taking the prednisone.  Increase fluids.

## 2018-11-30 NOTE — ED Triage Notes (Signed)
Pt states she has had issues with her throat for several months and has been to ENT and told may need to be removed. States it is on the left side going up into her ear.

## 2018-11-30 NOTE — ED Notes (Signed)
See triage note  Presents with pain to left side of throat    States pain is form left ear with some tenderness to glands  Afebrile on arrival

## 2018-11-30 NOTE — ED Provider Notes (Signed)
[facility] Emergency Department Provider Note   ____________________________________________   First MD Initiated Contact with Patient 11/30/18 0830     (approximate)  I have reviewed the triage vital signs and the nursing notes.   HISTORY  Chief Complaint Otalgia   HPI Jocelyn Simon is a 53 y.o. female presents to the ED with complaint of left ear pain which radiates down into the left side of her neck.  Patient states that is tender to touch.  She also notes that coughing increases her pain.  She denies any fever, chills, nausea or vomiting.  She states she has had issues with her throat several months ago which she was seen by an ENT specialist out of town and was told that she needed to have her tonsils removed.      Past Medical History:  Diagnosis Date   Anxiety    Asthma    COPD (chronic obstructive pulmonary disease) ([place])    Dyspnea    with exertion   GERD (gastroesophageal reflux disease)    Headache    Hyperlipidemia    Hypertension    Stroke [facility])    Vertigo     Patient Active Problem List   Diagnosis Date Noted   Chronic sore throat 09/12/2016   Essential hypertension 07/18/2016   Hyperlipidemia 07/18/2016   Gastroesophageal reflux disease without esophagitis 07/18/2016   Chronic obstructive pulmonary disease ([place]) 07/18/2016   Pelvic pain in female 12/20/2015   Prediabetes 09/19/2015   Left-sided weakness 04/07/2015    Past Surgical History:  Procedure Laterality Date   BREAST BIOPSY Left 12/10/2016   papilloma   BREAST EXCISIONAL BIOPSY Left 01/07/2017   excision of papilloma   BREAST LUMPECTOMY WITH NEEDLE LOCALIZATION Left 01/07/2017   Procedure: BREAST LUMPECTOMY WITH NEEDLE LOCALIZATION;  Surgeon: Christene Lye, MD;  Location: [facility] ORS;  Service: General;  Laterality: Left;   LAPAROSCOPIC SALPINGO OOPHERECTOMY Bilateral 12/20/2015   Procedure: LAPAROSCOPIC BILATERAL SALPINGO  OOPHORECTOMY WITH PELVIC WASHINGS;  Surgeon: Benjaman Kindler, MD;  Location: [facility] ORS;  Service: Gynecology;  Laterality: Bilateral;   TUBAL LIGATION      Prior to Admission medications   Medication Sig Start Date End Date Taking? Authorizing Provider  albuterol (PROVENTIL) (2.5 MG/3ML) 0.083% nebulizer solution Take 2.5 mg by nebulization every 6 (six) hours as needed for wheezing or shortness of breath.    [provider]  albuterol (VENTOLIN HFA) 108 (90 Base) MCG/ACT inhaler INHALE 2 PUFFS EVERY SIX HOURS AS NEEDED FOR COUGH, WHEEZING OR SHORTNESS OF BREATH. 08/15/16   Doles-Johnson, Teah, NP  amLODipine (NORVASC) 5 MG tablet Take 1 tablet (5 mg total) by mouth daily. Patient taking differently: Take 5 mg by mouth at bedtime.  08/15/16   Doles-Johnson, Teah, NP  aspirin EC 81 MG tablet Take 1 tablet (81 mg total) by mouth daily. 02/15/16   Juluis Pitch, MD  clopidogrel (PLAVIX) 75 MG tablet Take 1 tablet (75 mg total) by mouth daily. 04/01/16   Henreitta Leber, MD  estradiol (ESTRACE) 1 MG tablet Take 1 mg by mouth daily.    [provider]  fluticasone (FLONASE) 50 MCG/ACT nasal spray Place 2 sprays into both nostrils daily. 11/30/18 11/30/19  Johnn Hai, PA-C  hydrochlorothiazide (HYDRODIURIL) 25 MG tablet Take 1 tablet (25 mg total) by mouth daily. 07/18/16   Doles-Johnson, Teah, NP  predniSONE (DELTASONE) 10 MG tablet Take 6 tablets  today, on day 2 take 5 tablets, day 3 take 4 tablets,  day 4 take 3 tablets, day 5 take  2 tablets and 1 tablet the last day 11/30/18   Johnn Hai, PA-C    Allergies Patient has no known allergies.  Family History  Problem Relation Age of Onset   CAD Mother    CAD Sister    CVA Other    Breast cancer Neg Hx     Social History Social History   Tobacco Use   Smoking status: Current Every Day Smoker    Packs/day: 0.25    Types: Cigarettes   Smokeless tobacco: Never Used   Tobacco comment: interested in restarting  Nicotrol inhalers   Substance Use Topics   Alcohol use: No   Drug use: No    Review of Systems Constitutional: No fever/chills Eyes: No visual changes. ENT: No sore throat.  Left ear pain with left-sided throat pain. Cardiovascular: Denies chest pain. Respiratory: Denies shortness of breath. Gastrointestinal: No abdominal pain.  No nausea, no vomiting.   Musculoskeletal: Negative for muscle aches. Skin: Negative for rash. Neurological: Negative for headaches, focal weakness or numbness. ____________________________________________   PHYSICAL EXAM:  VITAL SIGNS: ED Triage Vitals  Enc Vitals Group     BP 11/30/18 0821 (!) 144/88     Pulse Rate 11/30/18 0821 76     Resp 11/30/18 0821 20     Temp 11/30/18 0821 98.5 F (36.9 C)     Temp Source 11/30/18 0821 Oral     SpO2 11/30/18 0824 99 %     Weight 11/30/18 0824 180 lb (81.6 kg)     Height 11/30/18 0824 5\' 1"  (1.549 m)     Head Circumference --      Peak Flow --      Pain Score 11/30/18 0824 8     Pain Loc --      Pain Edu? --      Excl. in [place]? --    Constitutional: Alert and oriented. Well appearing and in no acute distress. Eyes: Conjunctivae are normal. PERRL. EOMI. Head: Atraumatic. Nose: No congestion/rhinnorhea.  EACs are clear.  TMs are dull but no erythema is noted.  The left TM has some mild effusion present.  Patient states that left ear is unable to pop. Mouth/Throat: Mucous membranes are moist.  Oropharynx non-erythematous.  No tonsillar enlargement uvula is midline. Neck: No stridor.   Hematological/Lymphatic/Immunilogical: No cervical lymphadenopathy. Cardiovascular: Normal rate, regular rhythm. Grossly normal heart sounds.  Good peripheral circulation. Respiratory: Normal respiratory effort.  No retractions. Lungs CTAB. Musculoskeletal: Moves upper and lower extremities without any difficulty normal gait was noted. Neurologic:  Normal speech and language. No gross focal neurologic deficits are  appreciated. No gait instability. Skin:  Skin is warm, dry and intact. No rash noted. Psychiatric: Mood and affect are normal. Speech and behavior are normal.  ____________________________________________   LABS (all labs ordered are listed, but only abnormal results are displayed)  Labs Reviewed - No data to display   PROCEDURES  Procedure(s) performed (including Critical Care):  Procedures   ____________________________________________   INITIAL IMPRESSION / ASSESSMENT AND PLAN / ED COURSE  As part of my medical decision making, I reviewed the following data within the electronic MEDICAL RECORD NUMBER Notes from prior ED visits and [place] Controlled Substance Database  53 year old female presents to the ED with complaint of left ear pain that radiates down her left neck.  She has had similar symptoms in the past has seen an ENT who told her that she needed to have  her tonsils removed.  She denies any fever or chills and has no difficulty swallowing.  She states her left ear will not pop.  She has not taken any over-the-counter medication.  Exam is benign with the exception of some mild fluid behind the left TM.  Patient was given information about eustachian tube dysfunction.  She agrees to try Flonase nasal spray and prednisone.  She will follow-up with [facility] if any continued problems.  ____________________________________________   FINAL CLINICAL IMPRESSION(S) / ED DIAGNOSES  Final diagnoses:  Eustachian tube dysfunction, left     ED Discharge Orders         Ordered    fluticasone (FLONASE) 50 MCG/ACT nasal spray  Daily     11/30/18 0847    predniSONE (DELTASONE) 10 MG tablet     11/30/18 0847           Note:  This document was prepared using Dragon voice recognition software and may include unintentional dictation errors.    Johnn Hai, PA-C 11/30/18 1558    Earleen Newport, MD 12/01/18 [phone]

## 2019-04-26 ENCOUNTER — Emergency Department
Admission: EM | Admit: 2019-04-26 | Discharge: 2019-04-26 | Disposition: A | Discharge status: Home / Self Care | Payer: Medicaid Other | Attending: Emergency Medicine | Admitting: Emergency Medicine

## 2019-04-26 ENCOUNTER — Emergency Department: Payer: Medicaid Other

## 2019-04-26 ENCOUNTER — Other Ambulatory Visit: Payer: Self-pay

## 2019-04-26 ENCOUNTER — Other Ambulatory Visit: Payer: Self-pay | Admitting: Nurse Practitioner

## 2019-04-26 ENCOUNTER — Other Ambulatory Visit: Payer: Self-pay | Admitting: Otolaryngology

## 2019-04-26 DIAGNOSIS — J449 Chronic obstructive pulmonary disease, unspecified: Secondary | ICD-10-CM | POA: Diagnosis not present

## 2019-04-26 DIAGNOSIS — R079 Chest pain, unspecified: Secondary | ICD-10-CM

## 2019-04-26 DIAGNOSIS — I10 Essential (primary) hypertension: Secondary | ICD-10-CM | POA: Diagnosis not present

## 2019-04-26 DIAGNOSIS — J45909 Unspecified asthma, uncomplicated: Secondary | ICD-10-CM | POA: Diagnosis not present

## 2019-04-26 DIAGNOSIS — I471 Supraventricular tachycardia: Secondary | ICD-10-CM | POA: Insufficient documentation

## 2019-04-26 DIAGNOSIS — R002 Palpitations: Secondary | ICD-10-CM

## 2019-04-26 DIAGNOSIS — Z7902 Long term (current) use of antithrombotics/antiplatelets: Secondary | ICD-10-CM | POA: Insufficient documentation

## 2019-04-26 DIAGNOSIS — F17210 Nicotine dependence, cigarettes, uncomplicated: Secondary | ICD-10-CM | POA: Diagnosis not present

## 2019-04-26 DIAGNOSIS — Z79899 Other long term (current) drug therapy: Secondary | ICD-10-CM | POA: Diagnosis not present

## 2019-04-26 DIAGNOSIS — Z1231 Encounter for screening mammogram for malignant neoplasm of breast: Secondary | ICD-10-CM

## 2019-04-26 DIAGNOSIS — Z7982 Long term (current) use of aspirin: Secondary | ICD-10-CM | POA: Diagnosis not present

## 2019-04-26 DIAGNOSIS — Z8673 Personal history of transient ischemic attack (TIA), and cerebral infarction without residual deficits: Secondary | ICD-10-CM | POA: Diagnosis not present

## 2019-04-26 LAB — CBC
HCT: 39.1 % (ref 36.0–46.0)
Hemoglobin: 12.5 g/dL (ref 12.0–15.0)
MCH: 27.7 pg (ref 26.0–34.0)
MCHC: 32 g/dL (ref 30.0–36.0)
MCV: 86.7 fL (ref 80.0–100.0)
Platelets: 293 10*3/uL (ref 150–400)
RBC: 4.51 MIL/uL (ref 3.87–5.11)
RDW: 13.5 % (ref 11.5–15.5)
WBC: 5.1 10*3/uL (ref 4.0–10.5)
nRBC: 0 % (ref 0.0–0.2)

## 2019-04-26 LAB — BASIC METABOLIC PANEL
Anion gap: 11 (ref 5–15)
BUN: 19 mg/dL (ref 6–20)
CO2: 28 mmol/L (ref 22–32)
Calcium: 9.5 mg/dL (ref 8.9–10.3)
Chloride: 102 mmol/L (ref 98–111)
Creatinine, Ser: 0.64 mg/dL (ref 0.44–1.00)
GFR calc Af Amer: >60 mL/min (ref >60)
GFR calc non Af Amer: >60 mL/min (ref >60)
Glucose, Bld: 105 mg/dL — ABNORMAL HIGH (ref 70–99)
Potassium: 3.6 mmol/L (ref 3.5–5.1)
Sodium: 141 mmol/L (ref 135–145)

## 2019-04-26 LAB — TSH: TSH: 0.991 u[IU]/mL (ref 0.350–4.500)

## 2019-04-26 LAB — TROPONIN I (HIGH SENSITIVITY)
Troponin I (High Sensitivity): 8 ng/L (ref <18)
Troponin I (High Sensitivity): 7 ng/L (ref <18)

## 2019-04-26 LAB — T4, FREE: Free T4: 0.76 ng/dL (ref 0.61–1.12)

## 2019-04-26 MED ORDER — LIDOCAINE 5 % EX PTCH
1.0000 | MEDICATED_PATCH | Freq: Once | CUTANEOUS | Status: DC
  Administered 2019-04-26: 15:00:00 1 via TRANSDERMAL
  Filled 2019-04-26: qty 1

## 2019-04-26 MED ORDER — ACETAMINOPHEN 500 MG PO TABS
1000.0000 mg | ORAL_TABLET | Freq: Once | ORAL | Status: AC
  Administered 2019-04-26: 15:00:00 1000 mg via ORAL
  Filled 2019-04-26: qty 2

## 2019-04-26 MED ORDER — SODIUM CHLORIDE 0.9% FLUSH: 3.0000 mL | Freq: Once | INTRAVENOUS | Status: DC

## 2019-04-26 NOTE — ED Provider Notes (Signed)
[facility] Emergency Department Provider Note   ____________________________________________   First MD Initiated Contact with Patient 04/26/19 1304     (approximate)  I have reviewed the triage vital signs and the nursing notes.   HISTORY  Chief Complaint Chest Pain    HPI Jocelyn Simon is a 53 y.o. female with past medical history of hypertension, COPD, and GERD who presents to the ED complaining of chest pain.  Patient reports that since this morning she has been having intermittent episodes of feeling like her heart is racing.  These episodes seem to last for 10 to 20 seconds at a time before resolving.  She had multiple of these this morning prior to arrival in the ED, but has not had any further episodes since arriving.  She did state she had some central chest discomfort while in the waiting area.  She describes this as a squeezing that has now also resolved.  The episode of chest pain did not occur with any palpitations.  She states she has otherwise been feeling well with no fevers, cough, shortness of breath, leg swelling or pain.  She describes similar episodes in the past last year and follows with cardiology at [facility].  She states she does not take any medication for this.  She does describe some fullness around her anterior neck, states her thyroid has previously been evaluated but told this was normal.        Past Medical History:  Diagnosis Date   Anxiety    Asthma    COPD (chronic obstructive pulmonary disease) ([place])    Dyspnea    with exertion   GERD (gastroesophageal reflux disease)    Headache    Hyperlipidemia    Hypertension    Stroke [facility])    Vertigo     Patient Active Problem List   Diagnosis Date Noted   Chronic sore throat 09/12/2016   Essential hypertension 07/18/2016   Hyperlipidemia 07/18/2016   Gastroesophageal reflux disease without esophagitis 07/18/2016   Chronic obstructive pulmonary disease  ([place]) 07/18/2016   Pelvic pain in female 12/20/2015   Prediabetes 09/19/2015   Left-sided weakness 04/07/2015    Past Surgical History:  Procedure Laterality Date   BREAST BIOPSY Left 12/10/2016   papilloma   BREAST EXCISIONAL BIOPSY Left 01/07/2017   excision of papilloma   BREAST LUMPECTOMY WITH NEEDLE LOCALIZATION Left 01/07/2017   Procedure: BREAST LUMPECTOMY WITH NEEDLE LOCALIZATION;  Surgeon: Christene Lye, MD;  Location: [facility] ORS;  Service: General;  Laterality: Left;   LAPAROSCOPIC SALPINGO OOPHERECTOMY Bilateral 12/20/2015   Procedure: LAPAROSCOPIC BILATERAL SALPINGO OOPHORECTOMY WITH PELVIC WASHINGS;  Surgeon: Benjaman Kindler, MD;  Location: [facility] ORS;  Service: Gynecology;  Laterality: Bilateral;   TUBAL LIGATION      Prior to Admission medications   Medication Sig Start Date End Date Taking? Authorizing Provider  albuterol (PROVENTIL) (2.5 MG/3ML) 0.083% nebulizer solution Take 2.5 mg by nebulization every 6 (six) hours as needed for wheezing or shortness of breath.    [provider]  albuterol (VENTOLIN HFA) 108 (90 Base) MCG/ACT inhaler INHALE 2 PUFFS EVERY SIX HOURS AS NEEDED FOR COUGH, WHEEZING OR SHORTNESS OF BREATH. 08/15/16   Doles-Johnson, Teah, NP  amLODipine (NORVASC) 5 MG tablet Take 1 tablet (5 mg total) by mouth daily. Patient taking differently: Take 5 mg by mouth at bedtime.  08/15/16   Doles-Johnson, Teah, NP  aspirin EC 81 MG tablet Take 1 tablet (81 mg total) by mouth daily. 02/15/16  Juluis Pitch, MD  clopidogrel (PLAVIX) 75 MG tablet Take 1 tablet (75 mg total) by mouth daily. 04/01/16   Henreitta Leber, MD  estradiol (ESTRACE) 1 MG tablet Take 1 mg by mouth daily.    [provider]  fluticasone (FLONASE) 50 MCG/ACT nasal spray Place 2 sprays into both nostrils daily. 11/30/18 11/30/19  Johnn Hai, PA-C  hydrochlorothiazide (HYDRODIURIL) 25 MG tablet Take 1 tablet (25 mg total) by mouth daily. 07/18/16   Doles-Johnson,  Teah, NP  predniSONE (DELTASONE) 10 MG tablet Take 6 tablets  today, on day 2 take 5 tablets, day 3 take 4 tablets, day 4 take 3 tablets, day 5 take  2 tablets and 1 tablet the last day 11/30/18   Johnn Hai, PA-C    Allergies Patient has no known allergies.  Family History  Problem Relation Age of Onset   CAD Mother    CAD Sister    CVA Other    Breast cancer Neg Hx     Social History Social History   Tobacco Use   Smoking status: Current Every Day Smoker    Packs/day: 0.25    Types: Cigarettes   Smokeless tobacco: Never Used   Tobacco comment: interested in restarting Nicotrol inhalers   Substance Use Topics   Alcohol use: No   Drug use: No    Review of Systems  Constitutional: No fever/chills Eyes: No visual changes. ENT: No sore throat. Cardiovascular: Positive for palpitations and chest pain. Respiratory: Denies shortness of breath. Gastrointestinal: No abdominal pain.  No nausea, no vomiting.  No diarrhea.  No constipation. Genitourinary: Negative for dysuria. Musculoskeletal: Negative for back pain. Skin: Negative for rash. Neurological: Negative for headaches, focal weakness or numbness.  ____________________________________________   PHYSICAL EXAM:  VITAL SIGNS: ED Triage Vitals  Enc Vitals Group     BP 04/26/19 1042 134/79     Pulse Rate 04/26/19 1042 70     Resp 04/26/19 1042 16     Temp 04/26/19 1042 98.4 F (36.9 C)     Temp Source 04/26/19 1042 Oral     SpO2 04/26/19 1042 98 %     Weight 04/26/19 1044 194 lb (88 kg)     Height 04/26/19 1044 5\' 1"  (1.549 m)     Head Circumference --      Peak Flow --      Pain Score 04/26/19 1043 9     Pain Loc --      Pain Edu? --      Excl. in [place]? --     Constitutional: Alert and oriented. Eyes: Conjunctivae are normal. Head: Atraumatic. Nose: No congestion/rhinnorhea. Mouth/Throat: Mucous membranes are moist. Neck: Normal ROM Cardiovascular: Normal rate, regular rhythm. Grossly  normal heart sounds. Respiratory: Normal respiratory effort.  No retractions. Lungs CTAB. Gastrointestinal: Soft and nontender. No distention. Genitourinary: deferred Musculoskeletal: No lower extremity tenderness nor edema. Neurologic:  Normal speech and language. No gross focal neurologic deficits are appreciated. Skin:  Skin is warm, dry and intact. No rash noted. Psychiatric: Mood and affect are normal. Speech and behavior are normal.  ____________________________________________   LABS (all labs ordered are listed, but only abnormal results are displayed)  Labs Reviewed  BASIC METABOLIC PANEL - Abnormal; Notable for the following components:      Result Value   Glucose, Bld 105 (*)    All other components within normal limits  CBC  TSH  T4, FREE  TROPONIN I (HIGH SENSITIVITY)  TROPONIN I (HIGH  SENSITIVITY)   ____________________________________________  EKG  ED ECG REPORT I, Blake Divine, the attending physician, personally viewed and interpreted this ECG.   Date: 04/26/2019  EKG Time: 10:39  Rate: 69  Rhythm: normal sinus rhythm  Axis: Normal  Intervals:none  ST&T Change: None   PROCEDURES  Procedure(s) performed (including Critical Care):  Procedures   ____________________________________________   INITIAL IMPRESSION / ASSESSMENT AND PLAN / ED COURSE       53 year old female with history of hypertension, COPD, and palpitations presents to the ED with multiple episodes of palpitations this morning as well as an episode of chest pain that was not associated with palpitations.  All of her symptoms have now resolved and she describes episodes as similar to what she is experienced in the past.  EKG shows normal sinus rhythm with no out any ischemic changes and troponin is within normal limits.  Low suspicion for ACS given her atypical symptoms.  She did have Holter monitor study performed at [facility] that showed multiple runs of SVT and suspect this is what has  been occurring with her today.  She does admit to drinking caffeinated soda regularly and counseled her to avoid this.  She also had echocardiogram performed at [facility] this week that was unremarkable.  Remainder of labs are unremarkable.  Will screen repeat troponin as well as thyroid studies.  Chest x-ray negative for acute process.  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  If remainder of work-up remains negative, suspect musculoskeletal etiology of her chest pain.  Will treat with Lidoderm patch and Tylenol.  Repeat troponin within normal limits, suspect chest pain is musculoskeletal in etiology.  Thyroid function tests are unremarkable.  Patient with no further episodes of palpitations and remains in sinus rhythm on monitor.  Counseled patient to avoid caffeine and would withhold starting on a beta-blocker unless she continues to have symptoms without regular caffeine.  Counseled to follow-up with her PCP and cardiology in [facility], otherwise return to the ED for new or worsening symptoms.  Patient agrees with plan.      ____________________________________________   FINAL CLINICAL IMPRESSION(S) / ED DIAGNOSES  Final diagnoses:  Palpitations  Paroxysmal SVT (supraventricular tachycardia) (HCC)  Chest pain, unspecified type     ED Discharge Orders    None       Note:  This document was prepared using Dragon voice recognition software and may include unintentional dictation errors.   Blake Divine, MD 04/26/19 1515

## 2019-04-26 NOTE — ED Triage Notes (Signed)
Pt c/o while traveling to work this morning began having heart palpitations with SOB and pain radiating to the left arm. States she has a hx of chest pain and normally gets her care at [facility].Marland Kitchen pt is in NAD

## 2019-06-22 ENCOUNTER — Other Ambulatory Visit: Payer: Self-pay

## 2019-06-22 ENCOUNTER — Encounter: Payer: Self-pay | Admitting: *Deleted

## 2019-06-22 DIAGNOSIS — Z8673 Personal history of transient ischemic attack (TIA), and cerebral infarction without residual deficits: Secondary | ICD-10-CM | POA: Insufficient documentation

## 2019-06-22 DIAGNOSIS — U071 COVID-19: Secondary | ICD-10-CM | POA: Diagnosis not present

## 2019-06-22 DIAGNOSIS — I10 Essential (primary) hypertension: Secondary | ICD-10-CM | POA: Diagnosis not present

## 2019-06-22 DIAGNOSIS — Z79899 Other long term (current) drug therapy: Secondary | ICD-10-CM | POA: Insufficient documentation

## 2019-06-22 DIAGNOSIS — Z7982 Long term (current) use of aspirin: Secondary | ICD-10-CM | POA: Diagnosis not present

## 2019-06-22 DIAGNOSIS — J449 Chronic obstructive pulmonary disease, unspecified: Secondary | ICD-10-CM | POA: Diagnosis not present

## 2019-06-22 DIAGNOSIS — F17210 Nicotine dependence, cigarettes, uncomplicated: Secondary | ICD-10-CM | POA: Insufficient documentation

## 2019-06-22 DIAGNOSIS — R509 Fever, unspecified: Secondary | ICD-10-CM | POA: Diagnosis present

## 2019-06-22 DIAGNOSIS — J45909 Unspecified asthma, uncomplicated: Secondary | ICD-10-CM | POA: Diagnosis not present

## 2019-06-22 DIAGNOSIS — Z7902 Long term (current) use of antithrombotics/antiplatelets: Secondary | ICD-10-CM | POA: Diagnosis not present

## 2019-06-22 LAB — URINALYSIS, COMPLETE (UACMP) WITH MICROSCOPIC
Bacteria, UA: NONE SEEN
Bilirubin Urine: NEGATIVE
Glucose, UA: NEGATIVE mg/dL
Hgb urine dipstick: NEGATIVE
Ketones, ur: NEGATIVE mg/dL
Leukocytes,Ua: NEGATIVE
Nitrite: NEGATIVE
Protein, ur: NEGATIVE mg/dL
Specific Gravity, Urine: 1.016 (ref 1.005–1.030)
pH: 5 (ref 5.0–8.0)

## 2019-06-22 NOTE — ED Triage Notes (Signed)
Pt reports urinary frequency, sore throat, headache, and runny nose today. Says she feels like she has a fever.  Reports her daughter tested positive for COVID yesterday.

## 2019-06-23 ENCOUNTER — Emergency Department
Admission: EM | Admit: 2019-06-23 | Discharge: 2019-06-23 | Disposition: A | Discharge status: Home / Self Care | Payer: Medicaid Other | Attending: Emergency Medicine | Admitting: Emergency Medicine

## 2019-06-23 ENCOUNTER — Emergency Department: Payer: Medicaid Other

## 2019-06-23 DIAGNOSIS — R509 Fever, unspecified: Secondary | ICD-10-CM

## 2019-06-23 DIAGNOSIS — U071 COVID-19: Secondary | ICD-10-CM

## 2019-06-23 LAB — POC SARS CORONAVIRUS 2 AG
SARS Coronavirus 2 Ag: POSITIVE — AB
SARS Coronavirus 2 Ag: POSITIVE — AB

## 2019-06-23 MED ORDER — PREDNISONE 20 MG PO TABS: ORAL_TABLET | ORAL | 0 refills | Status: DC

## 2019-06-23 MED ORDER — HYDROCOD POLST-CPM POLST ER 10-8 MG/5ML PO SUER: 5.0000 mL | Freq: Two times a day (BID) | ORAL | 0 refills | Status: DC

## 2019-06-23 MED ORDER — HYDROCOD POLST-CPM POLST ER 10-8 MG/5ML PO SUER
5.0000 mL | Freq: Once | ORAL | Status: AC
  Administered 2019-06-23: 5 mL via ORAL
  Filled 2019-06-23: qty 5

## 2019-06-23 MED ORDER — ONDANSETRON 4 MG PO TBDP
4.0000 mg | ORAL_TABLET | Freq: Once | ORAL | Status: AC
  Administered 2019-06-23: 4 mg via ORAL
  Filled 2019-06-23: qty 1

## 2019-06-23 MED ORDER — ONDANSETRON 4 MG PO TBDP: 4.0000 mg | ORAL_TABLET | Freq: Three times a day (TID) | ORAL | 0 refills | Status: DC | PRN

## 2019-06-23 MED ORDER — ACETAMINOPHEN 325 MG PO TABS
650.0000 mg | ORAL_TABLET | Freq: Once | ORAL | Status: AC
  Administered 2019-06-23: 650 mg via ORAL
  Filled 2019-06-23: qty 2

## 2019-06-23 MED ORDER — ALBUTEROL SULFATE HFA 108 (90 BASE) MCG/ACT IN AERS: 2.0000 | INHALATION_SPRAY | RESPIRATORY_TRACT | 0 refills | Status: DC | PRN

## 2019-06-23 MED ORDER — AZITHROMYCIN 250 MG PO TABS: ORAL_TABLET | ORAL | 0 refills | Status: DC

## 2019-06-23 NOTE — ED Provider Notes (Signed)
[facility] Emergency Department Provider Note   ____________________________________________   None    (approximate)  I have reviewed the triage vital signs and the nursing notes.   HISTORY  Chief Complaint Urinary Frequency    HPI Jocelyn Simon is a 53 y.o. female who presents to the ED from home with a chief complaint of fever, runny nose, sore throat, headache, cough, urinary frequency.  Her daughter called her to say that she tested positive for COVID-19 yesterday and urged her to come to the doctor.  Patient denies chest pain, shortness of breath, abdominal pain, nausea, vomiting or diarrhea.       Past Medical History:  Diagnosis Date  . Anxiety   . Asthma   . COPD (chronic obstructive pulmonary disease) ([place])   . Dyspnea    with exertion  . GERD (gastroesophageal reflux disease)   . Headache   . Hyperlipidemia   . Hypertension   . Stroke ([place])   . Vertigo     Patient Active Problem List   Diagnosis Date Noted  . Chronic sore throat 09/12/2016  . Essential hypertension 07/18/2016  . Hyperlipidemia 07/18/2016  . Gastroesophageal reflux disease without esophagitis 07/18/2016  . Chronic obstructive pulmonary disease ([place]) 07/18/2016  . Pelvic pain in female 12/20/2015  . Prediabetes 09/19/2015  . Left-sided weakness 04/07/2015    Past Surgical History:  Procedure Laterality Date  . BREAST BIOPSY Left 12/10/2016   papilloma  . BREAST EXCISIONAL BIOPSY Left 01/07/2017   excision of papilloma  . BREAST LUMPECTOMY WITH NEEDLE LOCALIZATION Left 01/07/2017   Procedure: BREAST LUMPECTOMY WITH NEEDLE LOCALIZATION;  Surgeon: Christene Lye, MD;  Location: [facility] ORS;  Service: General;  Laterality: Left;  . LAPAROSCOPIC SALPINGO OOPHERECTOMY Bilateral 12/20/2015   Procedure: LAPAROSCOPIC BILATERAL SALPINGO OOPHORECTOMY WITH PELVIC WASHINGS;  Surgeon: Benjaman Kindler, MD;  Location: [facility] ORS;  Service: Gynecology;  Laterality:  Bilateral;  . TUBAL LIGATION      Prior to Admission medications   Medication Sig Start Date End Date Taking? Authorizing Provider  albuterol (PROVENTIL) (2.5 MG/3ML) 0.083% nebulizer solution Take 2.5 mg by nebulization every 6 (six) hours as needed for wheezing or shortness of breath.    [provider]  albuterol (VENTOLIN HFA) 108 (90 Base) MCG/ACT inhaler INHALE 2 PUFFS EVERY SIX HOURS AS NEEDED FOR COUGH, WHEEZING OR SHORTNESS OF BREATH. 08/15/16   Doles-Johnson, Teah, NP  amLODipine (NORVASC) 5 MG tablet Take 1 tablet (5 mg total) by mouth daily. Patient taking differently: Take 5 mg by mouth at bedtime.  08/15/16   Doles-Johnson, Teah, NP  aspirin EC 81 MG tablet Take 1 tablet (81 mg total) by mouth daily. 02/15/16   Juluis Pitch, MD  clopidogrel (PLAVIX) 75 MG tablet Take 1 tablet (75 mg total) by mouth daily. 04/01/16   Henreitta Leber, MD  estradiol (ESTRACE) 1 MG tablet Take 1 mg by mouth daily.    [provider]  fluticasone (FLONASE) 50 MCG/ACT nasal spray Place 2 sprays into both nostrils daily. 11/30/18 11/30/19  Johnn Hai, PA-C  hydrochlorothiazide (HYDRODIURIL) 25 MG tablet Take 1 tablet (25 mg total) by mouth daily. 07/18/16   Doles-Johnson, Teah, NP  predniSONE (DELTASONE) 10 MG tablet Take 6 tablets  today, on day 2 take 5 tablets, day 3 take 4 tablets, day 4 take 3 tablets, day 5 take  2 tablets and 1 tablet the last day 11/30/18   Johnn Hai, PA-C    Allergies Patient  has no known allergies.  Family History  Problem Relation Age of Onset  . CAD Mother   . CAD Sister   . CVA Other   . Breast cancer Neg Hx     Social History Social History   Tobacco Use  . Smoking status: Current Every Day Smoker    Packs/day: 0.25    Types: Cigarettes  . Smokeless tobacco: Never Used  . Tobacco comment: interested in restarting Nicotrol inhalers   Substance Use Topics  . Alcohol use: No  . Drug use: No    Review of Systems  Constitutional:  Positive for fever/chills and myalgias. Eyes: No visual changes. ENT: Positive for sore throat. Cardiovascular: Denies chest pain. Respiratory: Positive for cough.  Denies shortness of breath. Gastrointestinal: No abdominal pain.  No nausea, no vomiting.  No diarrhea.  No constipation. Genitourinary: Negative for dysuria. Musculoskeletal: Negative for back pain. Skin: Negative for rash. Neurological: Positive for headache. Negative for focal weakness or numbness.   ____________________________________________   PHYSICAL EXAM:  VITAL SIGNS: ED Triage Vitals  Enc Vitals Group     BP 06/22/19 2126 (!) 178/97     Pulse Rate 06/22/19 2126 (!) 103     Resp 06/22/19 2126 18     Temp 06/22/19 2126 99.9 F (37.7 C)     Temp Source 06/22/19 2126 Oral     SpO2 06/22/19 2126 98 %     Weight --      Height --      Head Circumference --      Peak Flow --      Pain Score 06/22/19 2127 5     Pain Loc --      Pain Edu? --      Excl. in [place]? --     Constitutional: Alert and oriented. Well appearing and in no acute distress. Eyes: Conjunctivae are normal. PERRL. EOMI. Head: Atraumatic. Nose: Congestion/rhinnorhea. Mouth/Throat: Mucous membranes are moist.  Oropharynx mildly erythematous without tonsillar swelling, exudates or peritonsillar abscess.  There is no hoarse or muffled voice.  There is no drooling. Neck: No stridor.  Supple neck without meningismus. Cardiovascular: Normal rate, regular rhythm. Grossly normal heart sounds.  Good peripheral circulation. Respiratory: Normal respiratory effort.  No retractions. Lungs CTAB. Gastrointestinal: Soft and nontender. No distention. No abdominal bruits. No CVA tenderness. Musculoskeletal: No lower extremity tenderness nor edema.  No joint effusions. Neurologic:  Normal speech and language. No gross focal neurologic deficits are appreciated. No gait instability. Skin:  Skin is warm, dry and intact. No rash noted.  No petechiae. Psychiatric:  Mood and affect are normal. Speech and behavior are normal.  ____________________________________________   LABS (all labs ordered are listed, but only abnormal results are displayed)  Labs Reviewed  URINALYSIS, COMPLETE (UACMP) WITH MICROSCOPIC - Abnormal; Notable for the following components:      Result Value   Color, Urine STRAW (*)    APPearance CLEAR (*)    All other components within normal limits  POC SARS CORONAVIRUS 2 AG - Abnormal; Notable for the following components:   SARS Coronavirus 2 Ag POSITIVE (*)    All other components within normal limits  POC SARS CORONAVIRUS 2 AG -  ED   ____________________________________________  EKG  None ____________________________________________  RADIOLOGY  ED MD interpretation: No pneumonia  Official radiology report(s): DG Chest Port 1 View  Result Date: 06/23/2019 CLINICAL DATA:  Fever and COVID exposure EXAM: PORTABLE CHEST 1 VIEW COMPARISON:  04/26/2019 FINDINGS: Normal heart size  and mediastinal contours. Stable interstitial coarsening without focal infiltrate. There is no edema, consolidation, effusion, or pneumothorax. IMPRESSION: Stable.  No evidence of active disease. Electronically Signed   By: Monte Fantasia M.D.   On: 06/23/2019 04:20    ____________________________________________   PROCEDURES  Procedure(s) performed (including Critical Care):  Procedures   ____________________________________________   INITIAL IMPRESSION / ASSESSMENT AND PLAN / ED COURSE  As part of my medical decision making, I reviewed the following data within the [place] notes reviewed and incorporated, Labs reviewed, Radiograph reviewed and Notes from prior ED visits     LEONDRA CAMARDA was evaluated in Emergency Department on 06/23/2019 for the symptoms described in the history of present illness. She was evaluated in the context of the global COVID-19 pandemic, which necessitated consideration  that the patient might be at risk for infection with the SARS-CoV-2 virus that causes COVID-19. Institutional protocols and algorithms that pertain to the evaluation of patients at risk for COVID-19 are in a state of rapid change based on information released by regulatory bodies including the CDC and federal and state organizations. These policies and algorithms were followed during the patient's care in the ED.    53 year old female who presents with fever, headache, sore throat, myalgias, urinary frequency.  Has had COVID-19 exposure.  Differential diagnosis includes but is not limited to viral process such as COVID-19, influenza, immunity acquired pneumonia, UTI, etc.   Clinical Course as of Jun 22 536  Wed Jun 23, 2019  0535 Covid antigen is POSITIVE.  Patient advised to quarantine at home.  Will discharge with prescriptions for Z-Pak, prednisone, Tussionex, albuterol inhaler and Zofran.  Strict return precautions given.  Patient verbalizes understanding and agrees with plan of care.   [JS]    Clinical Course User Index [JS] Paulette Blanch, MD     ____________________________________________   FINAL CLINICAL IMPRESSION(S) / ED DIAGNOSES  Final diagnoses:  COVID-19  Fever, unspecified fever cause     ED Discharge Orders    None       Note:  This document was prepared using Dragon voice recognition software and may include unintentional dictation errors.   Paulette Blanch, MD 06/23/19 [phone]

## 2019-06-23 NOTE — Discharge Instructions (Addendum)
1.  Quarantine yourself for 14 days. 2.  Take these medicines as prescribed: Z-Pak Prednisone 60 mg daily x5 days 3.  Take these medicines as needed: Albuterol inhaler 2 puffs every 4 hours as needed for cough/wheezing/difficulty breathing Tussionex for cough Zofran for nausea 4.  Return to the ER for worsening symptoms, persistent vomiting, difficulty breathing or other concerns.

## 2019-06-24 ENCOUNTER — Telehealth: Payer: Self-pay | Admitting: Unknown Physician Specialty

## 2019-06-24 NOTE — Telephone Encounter (Signed)
Called to discuss with patient about Covid symptoms and the use of bamlanivimab, a monoclonal antibody infusion for those with mild to moderate Covid symptoms and at a high risk of hospitalization.  Pt is qualified for this infusion at the [facility] infusion center due to BMI>35   She will think about it and call back

## 2019-06-24 NOTE — Telephone Encounter (Signed)
    Symptom onset 12/29

## 2019-10-04 ENCOUNTER — Encounter: Payer: Self-pay | Admitting: Emergency Medicine

## 2019-10-04 ENCOUNTER — Emergency Department
Admission: EM | Admit: 2019-10-04 | Discharge: 2019-10-04 | Disposition: A | Discharge status: Home / Self Care | Payer: Medicaid Other | Attending: Emergency Medicine | Admitting: Emergency Medicine

## 2019-10-04 ENCOUNTER — Other Ambulatory Visit: Payer: Self-pay

## 2019-10-04 DIAGNOSIS — K219 Gastro-esophageal reflux disease without esophagitis: Secondary | ICD-10-CM | POA: Insufficient documentation

## 2019-10-04 DIAGNOSIS — Z20822 Contact with and (suspected) exposure to covid-19: Secondary | ICD-10-CM | POA: Diagnosis not present

## 2019-10-04 DIAGNOSIS — R002 Palpitations: Secondary | ICD-10-CM | POA: Insufficient documentation

## 2019-10-04 DIAGNOSIS — J449 Chronic obstructive pulmonary disease, unspecified: Secondary | ICD-10-CM | POA: Insufficient documentation

## 2019-10-04 DIAGNOSIS — Z8616 Personal history of COVID-19: Secondary | ICD-10-CM | POA: Insufficient documentation

## 2019-10-04 DIAGNOSIS — R197 Diarrhea, unspecified: Secondary | ICD-10-CM | POA: Diagnosis not present

## 2019-10-04 DIAGNOSIS — Z79899 Other long term (current) drug therapy: Secondary | ICD-10-CM | POA: Diagnosis not present

## 2019-10-04 DIAGNOSIS — I10 Essential (primary) hypertension: Secondary | ICD-10-CM | POA: Diagnosis not present

## 2019-10-04 DIAGNOSIS — R1084 Generalized abdominal pain: Secondary | ICD-10-CM | POA: Diagnosis present

## 2019-10-04 DIAGNOSIS — F17210 Nicotine dependence, cigarettes, uncomplicated: Secondary | ICD-10-CM | POA: Diagnosis not present

## 2019-10-04 LAB — CBC
HCT: 39 % (ref 36.0–46.0)
Hemoglobin: 12.7 g/dL (ref 12.0–15.0)
MCH: 28.2 pg (ref 26.0–34.0)
MCHC: 32.6 g/dL (ref 30.0–36.0)
MCV: 86.5 fL (ref 80.0–100.0)
Platelets: 314 10*3/uL (ref 150–400)
RBC: 4.51 MIL/uL (ref 3.87–5.11)
RDW: 13.2 % (ref 11.5–15.5)
WBC: 4.9 10*3/uL (ref 4.0–10.5)
nRBC: 0 % (ref 0.0–0.2)

## 2019-10-04 LAB — URINALYSIS, COMPLETE (UACMP) WITH MICROSCOPIC
Bacteria, UA: NONE SEEN
Bilirubin Urine: NEGATIVE
Glucose, UA: NEGATIVE mg/dL
Hgb urine dipstick: NEGATIVE
Ketones, ur: NEGATIVE mg/dL
Leukocytes,Ua: NEGATIVE
Nitrite: NEGATIVE
Protein, ur: NEGATIVE mg/dL
Specific Gravity, Urine: 1.018 (ref 1.005–1.030)
pH: 6 (ref 5.0–8.0)

## 2019-10-04 LAB — COMPREHENSIVE METABOLIC PANEL
ALT: 64 U/L — ABNORMAL HIGH (ref 0–44)
AST: 37 U/L (ref 15–41)
Albumin: 4.6 g/dL (ref 3.5–5.0)
Alkaline Phosphatase: 55 U/L (ref 38–126)
Anion gap: 9 (ref 5–15)
BUN: 13 mg/dL (ref 6–20)
CO2: 29 mmol/L (ref 22–32)
Calcium: 9.1 mg/dL (ref 8.9–10.3)
Chloride: 101 mmol/L (ref 98–111)
Creatinine, Ser: 0.58 mg/dL (ref 0.44–1.00)
GFR calc Af Amer: >60 mL/min (ref >60)
GFR calc non Af Amer: >60 mL/min (ref >60)
Glucose, Bld: 97 mg/dL (ref 70–99)
Potassium: 3.1 mmol/L — ABNORMAL LOW (ref 3.5–5.1)
Sodium: 139 mmol/L (ref 135–145)
Total Bilirubin: 0.7 mg/dL (ref 0.3–1.2)
Total Protein: 8.1 g/dL (ref 6.5–8.1)

## 2019-10-04 LAB — LIPASE, BLOOD: Lipase: 30 U/L (ref 11–51)

## 2019-10-04 MED ORDER — DICYCLOMINE HCL 10 MG PO CAPS: 10.0000 mg | ORAL_CAPSULE | Freq: Four times a day (QID) | ORAL | 0 refills | Status: AC

## 2019-10-04 MED ORDER — SODIUM CHLORIDE 0.9% FLUSH: 3.0000 mL | Freq: Once | INTRAVENOUS | Status: DC

## 2019-10-04 MED ORDER — AMOXICILLIN-POT CLAVULANATE 875-125 MG PO TABS: 1.0000 | ORAL_TABLET | Freq: Two times a day (BID) | ORAL | 0 refills | Status: AC

## 2019-10-04 MED ORDER — ONDANSETRON 4 MG PO TBDP
4.0000 mg | ORAL_TABLET | Freq: Once | ORAL | Status: AC
  Administered 2019-10-04: 4 mg via ORAL

## 2019-10-04 MED ORDER — KETOROLAC TROMETHAMINE 30 MG/ML IJ SOLN
30.0000 mg | Freq: Once | INTRAMUSCULAR | Status: AC
  Administered 2019-10-04: 30 mg via INTRAMUSCULAR
  Filled 2019-10-04: qty 1

## 2019-10-04 MED ORDER — ONDANSETRON 4 MG PO TBDP: 4.0000 mg | ORAL_TABLET | Freq: Three times a day (TID) | ORAL | 0 refills | Status: DC | PRN

## 2019-10-04 NOTE — ED Provider Notes (Signed)
[facility] Emergency Department Provider Note   ____________________________________________    I have reviewed the triage vital signs and the nursing notes.   HISTORY  Chief Complaint Palpitations and Abdominal Pain     HPI Jocelyn Simon is a 54 y.o. female with multiple complaints however primary complaint of abdominal cramping and diarrhea for approximately 1 week.  She reports diarrhea seems to be improving over the last several days.  She notes that she was exposed to Covid recently however had it in the fall.  Denies fevers or chills.  No cough.  No loss of taste or smell.  Has not taken anything for her symptoms.  Did have some palpitations last night improved today.  No nausea or vomiting.   Past Medical History:  Diagnosis Date  . Anxiety   . Asthma   . COPD (chronic obstructive pulmonary disease) ([place])   . Dyspnea    with exertion  . GERD (gastroesophageal reflux disease)   . Headache   . Hyperlipidemia   . Hypertension   . Stroke ([place])   . Vertigo     Patient Active Problem List   Diagnosis Date Noted  . Chronic sore throat 09/12/2016  . Essential hypertension 07/18/2016  . Hyperlipidemia 07/18/2016  . Gastroesophageal reflux disease without esophagitis 07/18/2016  . Chronic obstructive pulmonary disease ([place]) 07/18/2016  . Pelvic pain in female 12/20/2015  . Prediabetes 09/19/2015  . Left-sided weakness 04/07/2015    Past Surgical History:  Procedure Laterality Date  . BREAST BIOPSY Left 12/10/2016   papilloma  . BREAST EXCISIONAL BIOPSY Left 01/07/2017   excision of papilloma  . BREAST LUMPECTOMY WITH NEEDLE LOCALIZATION Left 01/07/2017   Procedure: BREAST LUMPECTOMY WITH NEEDLE LOCALIZATION;  Surgeon: Christene Lye, MD;  Location: [facility] ORS;  Service: General;  Laterality: Left;  . LAPAROSCOPIC SALPINGO OOPHERECTOMY Bilateral 12/20/2015   Procedure: LAPAROSCOPIC BILATERAL SALPINGO OOPHORECTOMY WITH PELVIC  WASHINGS;  Surgeon: Benjaman Kindler, MD;  Location: [facility] ORS;  Service: Gynecology;  Laterality: Bilateral;  . TUBAL LIGATION      Prior to Admission medications   Medication Sig Start Date End Date Taking? Authorizing Provider  albuterol (PROVENTIL) (2.5 MG/3ML) 0.083% nebulizer solution Take 2.5 mg by nebulization every 6 (six) hours as needed for wheezing or shortness of breath.    [provider]  albuterol (VENTOLIN HFA) 108 (90 Base) MCG/ACT inhaler INHALE 2 PUFFS EVERY SIX HOURS AS NEEDED FOR COUGH, WHEEZING OR SHORTNESS OF BREATH. 08/15/16   Doles-Johnson, Teah, NP  albuterol (VENTOLIN HFA) 108 (90 Base) MCG/ACT inhaler Inhale 2 puffs into the lungs every 4 (four) hours as needed for wheezing or shortness of breath. 06/23/19   Paulette Blanch, MD  amLODipine (NORVASC) 5 MG tablet Take 1 tablet (5 mg total) by mouth daily. Patient taking differently: Take 5 mg by mouth at bedtime.  08/15/16   Doles-Johnson, Teah, NP  amoxicillin-clavulanate (AUGMENTIN) 875-125 MG tablet Take 1 tablet by mouth 2 (two) times daily for 7 days. 10/04/19 10/11/19  Lavonia Drafts, MD  aspirin EC 81 MG tablet Take 1 tablet (81 mg total) by mouth daily. 02/15/16   Juluis Pitch, MD  azithromycin (ZITHROMAX Z-PAK) 250 MG tablet 2 tablets day #1, then 1 tablet daily until finished 06/23/19   Paulette Blanch, MD  chlorpheniramine-HYDROcodone [facility] ER) 10-8 MG/5ML SUER Take 5 mLs by mouth 2 (two) times daily. 06/23/19   Paulette Blanch, MD  clopidogrel (PLAVIX) 75 MG tablet Take 1  tablet (75 mg total) by mouth daily. 04/01/16   Henreitta Leber, MD  dicyclomine (BENTYL) 10 MG capsule Take 1 capsule (10 mg total) by mouth 4 (four) times daily for 14 days. 10/04/19 10/18/19  Lavonia Drafts, MD  estradiol (ESTRACE) 1 MG tablet Take 1 mg by mouth daily.    [provider]  fluticasone (FLONASE) 50 MCG/ACT nasal spray Place 2 sprays into both nostrils daily. 11/30/18 11/30/19  Johnn Hai, PA-C    hydrochlorothiazide (HYDRODIURIL) 25 MG tablet Take 1 tablet (25 mg total) by mouth daily. 07/18/16   Doles-Johnson, Teah, NP  ondansetron (ZOFRAN ODT) 4 MG disintegrating tablet Take 1 tablet (4 mg total) by mouth every 8 (eight) hours as needed. 10/04/19   Lavonia Drafts, MD  predniSONE (DELTASONE) 20 MG tablet 3 tablets daily x5 days 06/23/19   Paulette Blanch, MD     Allergies Patient has no known allergies.  Family History  Problem Relation Age of Onset  . CAD Mother   . CAD Sister   . CVA Other   . Breast cancer Neg Hx     Social History Social History   Tobacco Use  . Smoking status: Current Every Day Smoker    Packs/day: 0.25    Types: Cigarettes  . Smokeless tobacco: Never Used  . Tobacco comment: interested in restarting Nicotrol inhalers   Substance Use Topics  . Alcohol use: No  . Drug use: No    Review of Systems  Constitutional: No fever/chills Eyes: No visual changes.  ENT: No sore throat. Cardiovascular: Denies chest pain.  As above Respiratory: Denies shortness of breath. Gastrointestinal: As above Genitourinary: Negative for dysuria. Musculoskeletal: Negative for back pain. Skin: Negative for rash. Neurological: Negative for headaches    ____________________________________________   PHYSICAL EXAM:  VITAL SIGNS: ED Triage Vitals  Enc Vitals Group     BP 10/04/19 1254 (!) 147/86     Pulse Rate 10/04/19 1254 81     Resp 10/04/19 1254 16     Temp 10/04/19 1254 98.7 F (37.1 C)     Temp Source 10/04/19 1254 Oral     SpO2 --      Weight 10/04/19 1255 88 kg (194 lb 0.1 oz)     Height 10/04/19 1255 1.549 m (5\' 1" )     Head Circumference --      Peak Flow --      Pain Score 10/04/19 1255 10     Pain Loc --      Pain Edu? --      Excl. in [place]? --     Constitutional: Alert and oriented. Eyes: Conjunctivae are normal.  .Nose: No congestion/rhinnorhea.  Cardiovascular: Normal rate, regular rhythm. Grossly normal heart sounds.  Good peripheral  circulation. Respiratory: Normal respiratory effort.  No retractions. Lungs CTAB. Gastrointestinal: Soft and nontender. No distention.  No CVA tenderness.  Reassuring exam Genitourinary: deferred Musculoskeletal: No lower extremity tenderness nor edema.  Warm and well perfused Neurologic:  Normal speech and language. No gross focal neurologic deficits are appreciated.  Skin:  Skin is warm, dry and intact. No rash noted. Psychiatric: Mood and affect are normal. Speech and behavior are normal.  ____________________________________________   LABS (all labs ordered are listed, but only abnormal results are displayed)  Labs Reviewed  COMPREHENSIVE METABOLIC PANEL - Abnormal; Notable for the following components:      Result Value   Potassium 3.1 (*)    ALT 64 (*)    All other  components within normal limits  URINALYSIS, COMPLETE (UACMP) WITH MICROSCOPIC - Abnormal; Notable for the following components:   Color, Urine YELLOW (*)    APPearance CLEAR (*)    All other components within normal limits  LIPASE, BLOOD  CBC   ____________________________________________  EKG  ED ECG REPORT I, Lavonia Drafts, the attending physician, personally viewed and interpreted this ECG.  Date: 10/04/2019  Rhythm: normal sinus rhythm QRS Axis: normal Intervals: normal ST/T Wave abnormalities: normal Narrative Interpretation: no evidence of acute ischemia  ____________________________________________  RADIOLOGY  None ____________________________________________   PROCEDURES  Procedure(s) performed: No  Procedures   Critical Care performed: No ____________________________________________   INITIAL IMPRESSION / ASSESSMENT AND PLAN / ED COURSE  Pertinent labs & imaging results that were available during my care of the patient were reviewed by me and considered in my medical decision making (see chart for details).  Patient presents with abdominal cramping, diarrhea however symptoms  seem to be improving in the last several days.  Suspicious for colitis versus gastroenteritis which is prevalent in the community this time.  Lab work is quite reassuring, normal kidney function, mild hypokalemia likely from diarrhea.  White blood cell count and CBC is normal.  Not consistent with C. difficile.  Recommend supportive care given reassuring exam here and reassuring vitals, reassuring labs.  Outpatient follow-up as needed, return precautions discussed      ____________________________________________   FINAL CLINICAL IMPRESSION(S) / ED DIAGNOSES  Final diagnoses:  Generalized abdominal pain  Diarrhea, unspecified type        Note:  This document was prepared using Dragon voice recognition software and may include unintentional dictation errors.   Lavonia Drafts, MD 10/04/19 2202

## 2019-10-04 NOTE — ED Triage Notes (Signed)
C/O palpitations since last night.  Also c/o stomach pain and diarrhea x 2 weeks.  Also c/o sob. STates has been around a friend who has had a COVID exposure.  Patient is AAOx3.  Skin warm and dry. NAD

## 2020-01-03 ENCOUNTER — Other Ambulatory Visit: Payer: Self-pay

## 2020-01-03 ENCOUNTER — Emergency Department
Admission: EM | Admit: 2020-01-03 | Discharge: 2020-01-03 | Disposition: A | Discharge status: Home / Self Care | Payer: Medicaid Other | Attending: Emergency Medicine | Admitting: Emergency Medicine

## 2020-01-03 ENCOUNTER — Emergency Department: Payer: Medicaid Other

## 2020-01-03 DIAGNOSIS — I10 Essential (primary) hypertension: Secondary | ICD-10-CM | POA: Diagnosis not present

## 2020-01-03 DIAGNOSIS — K2900 Acute gastritis without bleeding: Secondary | ICD-10-CM | POA: Diagnosis not present

## 2020-01-03 DIAGNOSIS — Z7952 Long term (current) use of systemic steroids: Secondary | ICD-10-CM | POA: Diagnosis not present

## 2020-01-03 DIAGNOSIS — F17210 Nicotine dependence, cigarettes, uncomplicated: Secondary | ICD-10-CM | POA: Diagnosis not present

## 2020-01-03 DIAGNOSIS — Z79899 Other long term (current) drug therapy: Secondary | ICD-10-CM | POA: Insufficient documentation

## 2020-01-03 DIAGNOSIS — R1011 Right upper quadrant pain: Secondary | ICD-10-CM | POA: Diagnosis present

## 2020-01-03 DIAGNOSIS — R1010 Upper abdominal pain, unspecified: Secondary | ICD-10-CM

## 2020-01-03 DIAGNOSIS — Z8673 Personal history of transient ischemic attack (TIA), and cerebral infarction without residual deficits: Secondary | ICD-10-CM | POA: Insufficient documentation

## 2020-01-03 DIAGNOSIS — R1013 Epigastric pain: Secondary | ICD-10-CM | POA: Insufficient documentation

## 2020-01-03 DIAGNOSIS — J449 Chronic obstructive pulmonary disease, unspecified: Secondary | ICD-10-CM | POA: Diagnosis not present

## 2020-01-03 DIAGNOSIS — Z7951 Long term (current) use of inhaled steroids: Secondary | ICD-10-CM | POA: Insufficient documentation

## 2020-01-03 LAB — CBC
HCT: 38 % (ref 36.0–46.0)
Hemoglobin: 12.1 g/dL (ref 12.0–15.0)
MCH: 27.4 pg (ref 26.0–34.0)
MCHC: 31.8 g/dL (ref 30.0–36.0)
MCV: 86.2 fL (ref 80.0–100.0)
Platelets: 292 10*3/uL (ref 150–400)
RBC: 4.41 MIL/uL (ref 3.87–5.11)
RDW: 14.1 % (ref 11.5–15.5)
WBC: 5.3 10*3/uL (ref 4.0–10.5)
nRBC: 0 % (ref 0.0–0.2)

## 2020-01-03 LAB — COMPREHENSIVE METABOLIC PANEL
ALT: 30 U/L (ref 0–44)
AST: 23 U/L (ref 15–41)
Albumin: 4.5 g/dL (ref 3.5–5.0)
Alkaline Phosphatase: 51 U/L (ref 38–126)
Anion gap: 8 (ref 5–15)
BUN: 14 mg/dL (ref 6–20)
CO2: 27 mmol/L (ref 22–32)
Calcium: 9.3 mg/dL (ref 8.9–10.3)
Chloride: 103 mmol/L (ref 98–111)
Creatinine, Ser: 0.72 mg/dL (ref 0.44–1.00)
GFR calc Af Amer: >60 mL/min (ref >60)
GFR calc non Af Amer: >60 mL/min (ref >60)
Glucose, Bld: 89 mg/dL (ref 70–99)
Potassium: 3.7 mmol/L (ref 3.5–5.1)
Sodium: 138 mmol/L (ref 135–145)
Total Bilirubin: 0.6 mg/dL (ref 0.3–1.2)
Total Protein: 8.1 g/dL (ref 6.5–8.1)

## 2020-01-03 LAB — URINALYSIS, COMPLETE (UACMP) WITH MICROSCOPIC
Bacteria, UA: NONE SEEN
Bilirubin Urine: NEGATIVE
Glucose, UA: NEGATIVE mg/dL
Hgb urine dipstick: NEGATIVE
Ketones, ur: NEGATIVE mg/dL
Leukocytes,Ua: NEGATIVE
Nitrite: NEGATIVE
Protein, ur: NEGATIVE mg/dL
Specific Gravity, Urine: 1.019 (ref 1.005–1.030)
pH: 5 (ref 5.0–8.0)

## 2020-01-03 LAB — LIPASE, BLOOD: Lipase: 27 U/L (ref 11–51)

## 2020-01-03 MED ORDER — LIDOCAINE VISCOUS HCL 2 % MT SOLN
15.0000 mL | Freq: Once | OROMUCOSAL | Status: AC
  Administered 2020-01-03: 15 mL via ORAL
  Filled 2020-01-03: qty 15

## 2020-01-03 MED ORDER — PANTOPRAZOLE SODIUM 20 MG PO TBEC: 20.0000 mg | DELAYED_RELEASE_TABLET | Freq: Every day | ORAL | 1 refills | Status: DC

## 2020-01-03 MED ORDER — SUCRALFATE 1 G PO TABS
1.0000 g | ORAL_TABLET | Freq: Four times a day (QID) | ORAL | 1 refills | Status: DC
Start: 2020-01-03 — End: 2023-11-06

## 2020-01-03 MED ORDER — ALUM & MAG HYDROXIDE-SIMETH 200-200-20 MG/5ML PO SUSP
30.0000 mL | Freq: Once | ORAL | Status: AC
  Administered 2020-01-03: 30 mL via ORAL
  Filled 2020-01-03: qty 30

## 2020-01-03 MED ORDER — ONDANSETRON 4 MG PO TBDP: 4.0000 mg | ORAL_TABLET | Freq: Three times a day (TID) | ORAL | 0 refills | Status: DC | PRN

## 2020-01-03 MED ORDER — MORPHINE SULFATE (PF) 4 MG/ML IV SOLN
4.0000 mg | Freq: Once | INTRAVENOUS | Status: AC
  Administered 2020-01-03: 4 mg via INTRAVENOUS
  Filled 2020-01-03: qty 1

## 2020-01-03 MED ORDER — PANTOPRAZOLE SODIUM 20 MG PO TBEC
20.0000 mg | DELAYED_RELEASE_TABLET | Freq: Every day | ORAL | 1 refills | Status: DC
Start: 2020-01-03 — End: 2023-11-06

## 2020-01-03 MED ORDER — ONDANSETRON HCL 4 MG/2ML IJ SOLN
4.0000 mg | Freq: Once | INTRAMUSCULAR | Status: AC
  Administered 2020-01-03: 4 mg via INTRAVENOUS

## 2020-01-03 MED ORDER — ONDANSETRON HCL 4 MG/2ML IJ SOLN
4.0000 mg | Freq: Once | INTRAMUSCULAR | Status: AC
  Administered 2020-01-03: 4 mg via INTRAVENOUS
  Filled 2020-01-03: qty 2

## 2020-01-03 MED ORDER — SODIUM CHLORIDE 0.9% FLUSH: 3.0000 mL | Freq: Once | INTRAVENOUS | Status: DC

## 2020-01-03 MED ORDER — SUCRALFATE 1 G PO TABS
1.0000 g | ORAL_TABLET | Freq: Four times a day (QID) | ORAL | 1 refills | Status: DC
Start: 2020-01-03 — End: 2020-01-03

## 2020-01-03 NOTE — ED Provider Notes (Signed)
[facility] Emergency Department Provider Note   ____________________________________________    I have reviewed the triage vital signs and the nursing notes.   HISTORY  Chief Complaint Abdominal Pain     HPI Jocelyn Simon is a 54 y.o. female with a history as noted below who presents with complaints of epigastric and right upper quadrant abdominal pain.  Patient reports this is been ongoing for about a week and has been intermittent and related to eating.  She denies fevers or chills.  Some nausea no vomiting.  Has not taken anything for this.  Normal stools although sometimes loose.  No sick contacts.  Past Medical History:  Diagnosis Date  . Anxiety   . Asthma   . COPD (chronic obstructive pulmonary disease) ([place])   . Dyspnea    with exertion  . GERD (gastroesophageal reflux disease)   . Headache   . Hyperlipidemia   . Hypertension   . Stroke ([place])   . Vertigo     Patient Active Problem List   Diagnosis Date Noted  . Chronic sore throat 09/12/2016  . Essential hypertension 07/18/2016  . Hyperlipidemia 07/18/2016  . Gastroesophageal reflux disease without esophagitis 07/18/2016  . Chronic obstructive pulmonary disease ([place]) 07/18/2016  . Pelvic pain in female 12/20/2015  . Prediabetes 09/19/2015  . Left-sided weakness 04/07/2015    Past Surgical History:  Procedure Laterality Date  . BREAST BIOPSY Left 12/10/2016   papilloma  . BREAST EXCISIONAL BIOPSY Left 01/07/2017   excision of papilloma  . BREAST LUMPECTOMY WITH NEEDLE LOCALIZATION Left 01/07/2017   Procedure: BREAST LUMPECTOMY WITH NEEDLE LOCALIZATION;  Surgeon: Christene Lye, MD;  Location: [facility] ORS;  Service: General;  Laterality: Left;  . LAPAROSCOPIC SALPINGO OOPHERECTOMY Bilateral 12/20/2015   Procedure: LAPAROSCOPIC BILATERAL SALPINGO OOPHORECTOMY WITH PELVIC WASHINGS;  Surgeon: Benjaman Kindler, MD;  Location: [facility] ORS;  Service: Gynecology;  Laterality:  Bilateral;  . TUBAL LIGATION      Prior to Admission medications   Medication Sig Start Date End Date Taking? Authorizing Provider  albuterol (PROVENTIL) (2.5 MG/3ML) 0.083% nebulizer solution Take 2.5 mg by nebulization every 6 (six) hours as needed for wheezing or shortness of breath.    [provider]  albuterol (VENTOLIN HFA) 108 (90 Base) MCG/ACT inhaler INHALE 2 PUFFS EVERY SIX HOURS AS NEEDED FOR COUGH, WHEEZING OR SHORTNESS OF BREATH. 08/15/16   Doles-Johnson, Teah, NP  albuterol (VENTOLIN HFA) 108 (90 Base) MCG/ACT inhaler Inhale 2 puffs into the lungs every 4 (four) hours as needed for wheezing or shortness of breath. 06/23/19   Paulette Blanch, MD  amLODipine (NORVASC) 5 MG tablet Take 1 tablet (5 mg total) by mouth daily. Patient taking differently: Take 5 mg by mouth at bedtime.  08/15/16   Doles-Johnson, Teah, NP  aspirin EC 81 MG tablet Take 1 tablet (81 mg total) by mouth daily. 02/15/16   Juluis Pitch, MD  azithromycin (ZITHROMAX Z-PAK) 250 MG tablet 2 tablets day #1, then 1 tablet daily until finished 06/23/19   Paulette Blanch, MD  chlorpheniramine-HYDROcodone [facility] ER) 10-8 MG/5ML SUER Take 5 mLs by mouth 2 (two) times daily. 06/23/19   Paulette Blanch, MD  clopidogrel (PLAVIX) 75 MG tablet Take 1 tablet (75 mg total) by mouth daily. 04/01/16   Henreitta Leber, MD  dicyclomine (BENTYL) 10 MG capsule Take 1 capsule (10 mg total) by mouth 4 (four) times daily for 14 days. 10/04/19 10/18/19  Lavonia Drafts, MD  estradiol (  ESTRACE) 1 MG tablet Take 1 mg by mouth daily.    [provider]  fluticasone (FLONASE) 50 MCG/ACT nasal spray Place 2 sprays into both nostrils daily. 11/30/18 11/30/19  Johnn Hai, PA-C  hydrochlorothiazide (HYDRODIURIL) 25 MG tablet Take 1 tablet (25 mg total) by mouth daily. 07/18/16   Doles-Johnson, Teah, NP  ondansetron (ZOFRAN ODT) 4 MG disintegrating tablet Take 1 tablet (4 mg total) by mouth every 8 (eight) hours as needed.  01/03/20   Lavonia Drafts, MD  pantoprazole (PROTONIX) 20 MG tablet Take 1 tablet (20 mg total) by mouth daily. 01/03/20 01/02/21  Lavonia Drafts, MD  predniSONE (DELTASONE) 20 MG tablet 3 tablets daily x5 days 06/23/19   Paulette Blanch, MD  sucralfate (CARAFATE) 1 g tablet Take 1 tablet (1 g total) by mouth 4 (four) times daily. 01/03/20 01/02/21  Lavonia Drafts, MD     Allergies Patient has no known allergies.  Family History  Problem Relation Age of Onset  . CAD Mother   . CAD Sister   . CVA Other   . Breast cancer Neg Hx     Social History Social History   Tobacco Use  . Smoking status: Current Every Day Smoker    Packs/day: 0.25    Types: Cigarettes  . Smokeless tobacco: Never Used  . Tobacco comment: interested in restarting Nicotrol inhalers   Vaping Use  . Vaping Use: Never used  Substance Use Topics  . Alcohol use: No  . Drug use: No    Review of Systems  Constitutional: No fever/chills Eyes: No visual changes.  ENT: No sore throat. Cardiovascular: Denies chest pain. Respiratory: Denies shortness of breath. Gastrointestinal: As above Genitourinary: Negative for dysuria. Musculoskeletal: Negative for back pain. Skin: Negative for rash. Neurological: Negative for headaches    ____________________________________________   PHYSICAL EXAM:  VITAL SIGNS: ED Triage Vitals  Enc Vitals Group     BP 01/03/20 1300 131/86     Pulse Rate 01/03/20 1300 69     Resp 01/03/20 1300 17     Temp 01/03/20 1300 98.7 F (37.1 C)     Temp Source 01/03/20 1300 Oral     SpO2 01/03/20 1300 100 %     Weight 01/03/20 1301 90.3 kg (199 lb)     Height 01/03/20 1301 1.549 m (5\' 1" )     Head Circumference --      Peak Flow --      Pain Score 01/03/20 1301 10     Pain Loc --      Pain Edu? --      Excl. in [place]? --     Constitutional: Alert and oriented.  Nose: No congestion/rhinnorhea. Mouth/Throat: Mucous membranes are moist.    Cardiovascular: Normal rate, regular  rhythm. Grossly normal heart sounds.  Good peripheral circulation. Respiratory: Normal respiratory effort.  No retractions.  Gastrointestinal: Mild tenderness in the right upper quadrant and epigastric, no CVA tenderness, no distention  Musculoskeletal:  Warm and well perfused Neurologic:  Normal speech and language. No gross focal neurologic deficits are appreciated.  Skin:  Skin is warm, dry and intact. No rash noted. Psychiatric: Mood and affect are normal. Speech and behavior are normal.  ____________________________________________   LABS (all labs ordered are listed, but only abnormal results are displayed)  Labs Reviewed  URINALYSIS, COMPLETE (UACMP) WITH MICROSCOPIC - Abnormal; Notable for the following components:      Result Value   Color, Urine YELLOW (*)    APPearance CLEAR (*)  All other components within normal limits  LIPASE, BLOOD  COMPREHENSIVE METABOLIC PANEL  CBC   ____________________________________________  EKG  ED ECG REPORT I, Lavonia Drafts, the attending physician, personally viewed and interpreted this ECG.  Date: 01/03/2020  Rhythm: normal sinus rhythm QRS Axis: normal Intervals: normal ST/T Wave abnormalities: normal Narrative Interpretation: no evidence of acute ischemia  ____________________________________________  RADIOLOGY  Ultrasound right upper quadrant negative for cholelithiasis ____________________________________________   PROCEDURES  Procedure(s) performed: No  Procedures   Critical Care performed: No ____________________________________________   INITIAL IMPRESSION / ASSESSMENT AND PLAN / ED COURSE  Pertinent labs & imaging results that were available during my care of the patient were reviewed by me and considered in my medical decision making (see chart for details).  Patient presents with epigastric pain as noted above.  Differential includes gastritis, pancreatitis cholelithiasis versus cholecystitis,  PUD  Lab work is notable for normal white blood cell count, normal lipase, normal LFTs  Patient treated with GI cocktail with some improvement, also given IV morphine and IV Zofran while awaiting ultrasound  Ultrasound negative for cholelithiasis  Lipase is normal  Suspect gastritis as the cause of her symptoms, will start her on PPI, Carafate, close follow-up with GI.    ____________________________________________   FINAL CLINICAL IMPRESSION(S) / ED DIAGNOSES  Final diagnoses:  Upper abdominal pain  Acute gastritis without hemorrhage, unspecified gastritis type        Note:  This document was prepared using Dragon voice recognition software and may include unintentional dictation errors.   Lavonia Drafts, MD 01/03/20 2212

## 2020-01-03 NOTE — ED Triage Notes (Signed)
Pt c/o epigastric  Pain intemittently over the past 2 weeks, worse after eating, states she takes an antiacid. Having N/V/D with the pain. Pt is in NAD.

## 2020-01-03 NOTE — ED Notes (Signed)
Dr Corky Downs at bedside.

## 2020-05-18 ENCOUNTER — Emergency Department
Admission: EM | Admit: 2020-05-18 | Discharge: 2020-05-18 | Disposition: A | Discharge status: Home / Self Care | Payer: Medicaid Other | Attending: Emergency Medicine | Admitting: Emergency Medicine

## 2020-05-18 ENCOUNTER — Emergency Department: Payer: Medicaid Other

## 2020-05-18 ENCOUNTER — Other Ambulatory Visit: Payer: Self-pay

## 2020-05-18 DIAGNOSIS — F17210 Nicotine dependence, cigarettes, uncomplicated: Secondary | ICD-10-CM | POA: Insufficient documentation

## 2020-05-18 DIAGNOSIS — Z79899 Other long term (current) drug therapy: Secondary | ICD-10-CM | POA: Diagnosis not present

## 2020-05-18 DIAGNOSIS — J449 Chronic obstructive pulmonary disease, unspecified: Secondary | ICD-10-CM | POA: Diagnosis not present

## 2020-05-18 DIAGNOSIS — I10 Essential (primary) hypertension: Secondary | ICD-10-CM | POA: Insufficient documentation

## 2020-05-18 DIAGNOSIS — R519 Headache, unspecified: Secondary | ICD-10-CM | POA: Insufficient documentation

## 2020-05-18 DIAGNOSIS — Z7982 Long term (current) use of aspirin: Secondary | ICD-10-CM | POA: Insufficient documentation

## 2020-05-18 DIAGNOSIS — J45909 Unspecified asthma, uncomplicated: Secondary | ICD-10-CM | POA: Diagnosis not present

## 2020-05-18 LAB — CBC
HCT: 34.8 % — ABNORMAL LOW (ref 36.0–46.0)
Hemoglobin: 11.1 g/dL — ABNORMAL LOW (ref 12.0–15.0)
MCH: 27.8 pg (ref 26.0–34.0)
MCHC: 31.9 g/dL (ref 30.0–36.0)
MCV: 87 fL (ref 80.0–100.0)
Platelets: 272 K/uL (ref 150–400)
RBC: 4 MIL/uL (ref 3.87–5.11)
RDW: 13.7 % (ref 11.5–15.5)
WBC: 5.7 K/uL (ref 4.0–10.5)
nRBC: 0 % (ref 0.0–0.2)

## 2020-05-18 LAB — COMPREHENSIVE METABOLIC PANEL WITH GFR
ALT: 27 U/L (ref 0–44)
AST: 25 U/L (ref 15–41)
Albumin: 4.1 g/dL (ref 3.5–5.0)
Alkaline Phosphatase: 55 U/L (ref 38–126)
Anion gap: 11 (ref 5–15)
BUN: 16 mg/dL (ref 6–20)
CO2: 26 mmol/L (ref 22–32)
Calcium: 9.1 mg/dL (ref 8.9–10.3)
Chloride: 103 mmol/L (ref 98–111)
Creatinine, Ser: 0.74 mg/dL (ref 0.44–1.00)
GFR, Estimated: >60 mL/min
Glucose, Bld: 105 mg/dL — ABNORMAL HIGH (ref 70–99)
Potassium: 3.7 mmol/L (ref 3.5–5.1)
Sodium: 140 mmol/L (ref 135–145)
Total Bilirubin: 0.7 mg/dL (ref 0.3–1.2)
Total Protein: 7.5 g/dL (ref 6.5–8.1)

## 2020-05-18 MED ORDER — METOCLOPRAMIDE HCL 5 MG/ML IJ SOLN
20.0000 mg | Freq: Once | INTRAVENOUS | Status: AC
  Administered 2020-05-18: 20 mg via INTRAVENOUS
  Filled 2020-05-18: qty 4

## 2020-05-18 MED ORDER — DIPHENHYDRAMINE HCL 50 MG/ML IJ SOLN
25.0000 mg | Freq: Once | INTRAMUSCULAR | Status: AC
  Administered 2020-05-18: 25 mg via INTRAVENOUS
  Filled 2020-05-18: qty 1

## 2020-05-18 MED ORDER — KETOROLAC TROMETHAMINE 30 MG/ML IJ SOLN
30.0000 mg | Freq: Once | INTRAMUSCULAR | Status: AC
  Administered 2020-05-18: 30 mg via INTRAVENOUS
  Filled 2020-05-18: qty 1

## 2020-05-18 MED ORDER — BUTALBITAL-APAP-CAFFEINE 50-325-40 MG PO TABS: 1.0000 | ORAL_TABLET | Freq: Four times a day (QID) | ORAL | 0 refills | Status: AC | PRN

## 2020-05-18 NOTE — ED Triage Notes (Signed)
Pt states that she has had a persistent headache for 2 weeks, last pm she reports having left sided facial numbness radiating into the left ear, pt also reports pain in the left ear, denies tooth pain at this time

## 2020-05-18 NOTE — ED Triage Notes (Signed)
See first nurse note. Pt reports constant headache x2 weeks with left side ear pain. Reports numbness to left side of face that worsened 2 hours ago but started 5 days ago. Clear speech, movement in all extremities.   Discussed pt with Dr Corky Downs and roomed to 67

## 2020-05-18 NOTE — ED Notes (Signed)
Pharmacy messaged for reglan

## 2020-05-18 NOTE — ED Provider Notes (Signed)
[facility] Emergency Department Provider Note   ____________________________________________    I have reviewed the triage vital signs and the nursing notes.   HISTORY  Chief Complaint Headache, facial numbness, and Otalgia     HPI Jocelyn Simon is a 54 y.o. female who presents with complaints of 2 weeks of near constant left-sided headache.  Patient reports sometimes pain radiates into her left ear as well.  She had felt some numbness in her left cheek briefly, none currently.  No neuro deficits reported at this time.  No nausea or vomiting.  No trauma.  Not on blood thinners.  No history of similar headache.  No fevers chills or cough.  Has had Covid vaccines, due for booster tomorrow.  Past Medical History:  Diagnosis Date   Anxiety    Asthma    COPD (chronic obstructive pulmonary disease) ([facility])    Dyspnea    with exertion   GERD (gastroesophageal reflux disease)    Headache    Hyperlipidemia    Hypertension    Stroke [facility])    Vertigo     Patient Active Problem List   Diagnosis Date Noted   Chronic sore throat 09/12/2016   Essential hypertension 07/18/2016   Hyperlipidemia 07/18/2016   Gastroesophageal reflux disease without esophagitis 07/18/2016   Chronic obstructive pulmonary disease ([place]) 07/18/2016   Pelvic pain in female 12/20/2015   Prediabetes 09/19/2015   Left-sided weakness 04/07/2015    Past Surgical History:  Procedure Laterality Date   BREAST BIOPSY Left 12/10/2016   papilloma   BREAST EXCISIONAL BIOPSY Left 01/07/2017   excision of papilloma   BREAST LUMPECTOMY WITH NEEDLE LOCALIZATION Left 01/07/2017   Procedure: BREAST LUMPECTOMY WITH NEEDLE LOCALIZATION;  Surgeon: Christene Lye, MD;  Location: [facility] ORS;  Service: General;  Laterality: Left;   LAPAROSCOPIC SALPINGO OOPHERECTOMY Bilateral 12/20/2015   Procedure: LAPAROSCOPIC BILATERAL SALPINGO OOPHORECTOMY WITH PELVIC WASHINGS;   Surgeon: Benjaman Kindler, MD;  Location: [facility] ORS;  Service: Gynecology;  Laterality: Bilateral;   TUBAL LIGATION      Prior to Admission medications   Medication Sig Start Date End Date Taking? Authorizing Provider  albuterol (PROVENTIL) (2.5 MG/3ML) 0.083% nebulizer solution Take 2.5 mg by nebulization every 6 (six) hours as needed for wheezing or shortness of breath.    [provider]  albuterol (VENTOLIN HFA) 108 (90 Base) MCG/ACT inhaler INHALE 2 PUFFS EVERY SIX HOURS AS NEEDED FOR COUGH, WHEEZING OR SHORTNESS OF BREATH. 08/15/16   Doles-Johnson, Teah, NP  albuterol (VENTOLIN HFA) 108 (90 Base) MCG/ACT inhaler Inhale 2 puffs into the lungs every 4 (four) hours as needed for wheezing or shortness of breath. 06/23/19   Paulette Blanch, MD  amLODipine (NORVASC) 5 MG tablet Take 1 tablet (5 mg total) by mouth daily. Patient taking differently: Take 5 mg by mouth at bedtime.  08/15/16   Doles-Johnson, Teah, NP  aspirin EC 81 MG tablet Take 1 tablet (81 mg total) by mouth daily. 02/15/16   Juluis Pitch, MD  azithromycin (ZITHROMAX Z-PAK) 250 MG tablet 2 tablets day #1, then 1 tablet daily until finished 06/23/19   Paulette Blanch, MD  butalbital-acetaminophen-caffeine (FIORICET) [id number] MG tablet Take 1-2 tablets by mouth every 6 (six) hours as needed for headache. 05/18/20 05/18/21  Lavonia Drafts, MD  chlorpheniramine-HYDROcodone (TUSSIONEX PENNKINETIC ER) 10-8 MG/5ML SUER Take 5 mLs by mouth 2 (two) times daily. 06/23/19   Paulette Blanch, MD  clopidogrel (PLAVIX) 75 MG tablet Take 1 tablet (  75 mg total) by mouth daily. 04/01/16   Henreitta Leber, MD  dicyclomine (BENTYL) 10 MG capsule Take 1 capsule (10 mg total) by mouth 4 (four) times daily for 14 days. 10/04/19 10/18/19  Lavonia Drafts, MD  estradiol (ESTRACE) 1 MG tablet Take 1 mg by mouth daily.    [provider]  fluticasone (FLONASE) 50 MCG/ACT nasal spray Place 2 sprays into both nostrils daily. 11/30/18 11/30/19  Johnn Hai, PA-C  hydrochlorothiazide (HYDRODIURIL) 25 MG tablet Take 1 tablet (25 mg total) by mouth daily. 07/18/16   Doles-Johnson, Teah, NP  ondansetron (ZOFRAN ODT) 4 MG disintegrating tablet Take 1 tablet (4 mg total) by mouth every 8 (eight) hours as needed. 01/03/20   Lavonia Drafts, MD  pantoprazole (PROTONIX) 20 MG tablet Take 1 tablet (20 mg total) by mouth daily. 01/03/20 01/02/21  Lavonia Drafts, MD  predniSONE (DELTASONE) 20 MG tablet 3 tablets daily x5 days 06/23/19   Paulette Blanch, MD  sucralfate (CARAFATE) 1 g tablet Take 1 tablet (1 g total) by mouth 4 (four) times daily. 01/03/20 01/02/21  Lavonia Drafts, MD     Allergies Patient has no known allergies.  Family History  Problem Relation Age of Onset   CAD Mother    CAD Sister    CVA Other    Breast cancer Neg Hx     Social History Social History   Tobacco Use   Smoking status: Current Every Day Smoker    Packs/day: 0.25    Types: Cigarettes   Smokeless tobacco: Never Used   Tobacco comment: interested in restarting Nicotrol inhalers   Vaping Use   Vaping Use: Never used  Substance Use Topics   Alcohol use: No   Drug use: No    Review of Systems  Constitutional: No fever/chills Eyes: No visual changes.  ENT: No sore throat. Cardiovascular: Denies chest pain. Respiratory: Denies shortness of breath. Gastrointestinal: No abdominal pain.  No nausea, no vomiting.   Genitourinary: Negative for dysuria. Musculoskeletal: Negative for back pain. Skin: Negative for rash. Neurological: As above  ____________________________________________   PHYSICAL EXAM:  VITAL SIGNS: ED Triage Vitals  Enc Vitals Group     BP 05/18/20 1548 125/77     Pulse Rate 05/18/20 1548 69     Resp 05/18/20 1548 20     Temp --      Temp src --      SpO2 05/18/20 1548 98 %     Weight 05/18/20 1546 91.6 kg (202 lb)     Height 05/18/20 1546 1.549 m (5\' 1" )     Head Circumference --      Peak Flow --      Pain Score  05/18/20 1546 10     Pain Loc --      Pain Edu? --      Excl. in [place]? --     Constitutional: Alert and oriented. No acute distress.  Eyes: Conjunctivae are normal.  PERRLA, EOMI Ears: Normal TMs Nose: No congestion/rhinnorhea. Mouth/Throat: Mucous membranes are moist.   Neck:  Painless ROM Cardiovascular: Normal rate, regular rhythm.  Good peripheral circulation. Respiratory: Normal respiratory effort.  No retractions.  Gastrointestinal: Soft and nontender. No distention.   Musculoskeletal: No lower extremity tenderness nor edema.  Warm and well perfused Neurologic:  Normal speech and language. No gross focal neurologic deficits are appreciated.  Skin:  Skin is warm, dry and intact. No rash noted. Psychiatric: Mood and affect are normal. Speech and behavior  are normal.  ____________________________________________   LABS (all labs ordered are listed, but only abnormal results are displayed)  Labs Reviewed  CBC - Abnormal; Notable for the following components:      Result Value   Hemoglobin 11.1 (*)    HCT 34.8 (*)    All other components within normal limits  COMPREHENSIVE METABOLIC PANEL - Abnormal; Notable for the following components:   Glucose, Bld 105 (*)    All other components within normal limits   ____________________________________________  EKG ED ECG REPORT I, Lavonia Drafts, the attending physician, personally viewed and interpreted this ECG.  Date: 05/18/2020  Rhythm: normal sinus rhythm QRS Axis: normal Intervals: normal ST/T Wave abnormalities: normal Narrative Interpretation: no evidence of acute ischemia  ____________________________________________  RADIOLOGY  CT head reviewed by me ____________________________________________   PROCEDURES  Procedure(s) performed: No  Procedures   Critical Care performed: No ____________________________________________   INITIAL IMPRESSION / ASSESSMENT AND PLAN / ED COURSE  Pertinent labs &  imaging results that were available during my care of the patient were reviewed by me and considered in my medical decision making (see chart for details).  Patient presents with headache which is nearly constant x2 weeks.  Reported seems to worsen when bending over as well, has been taking BC powders with little improvement.  Not on blood thinners.  Does have a history of high blood pressure, takes medication for this.  No neuro deficits to suggest stroke, not consistent with TIA.  Low possibility of ICH, will send for CT head, treat with headache cocktail for possible migraine/tension headache  CT head reviewed by me, reassuring  Lab work is unremarkable.  Patient is feeling much better after treatment, appropriate for discharge at this time, will refer to neurology for close follow-up if headache returns.  Return cautions discussed    ____________________________________________   FINAL CLINICAL IMPRESSION(S) / ED DIAGNOSES  Final diagnoses:  Bad headache        Note:  This document was prepared using Dragon voice recognition software and may include unintentional dictation errors.   Lavonia Drafts, MD 05/18/20 [phone]

## 2020-06-29 ENCOUNTER — Emergency Department: Payer: Medicaid Other

## 2020-06-29 ENCOUNTER — Encounter: Payer: Self-pay | Admitting: Emergency Medicine

## 2020-06-29 ENCOUNTER — Other Ambulatory Visit: Payer: Self-pay

## 2020-06-29 ENCOUNTER — Emergency Department
Admission: EM | Admit: 2020-06-29 | Discharge: 2020-06-29 | Disposition: A | Discharge status: Home / Self Care | Payer: Medicaid Other | Attending: Emergency Medicine | Admitting: Emergency Medicine

## 2020-06-29 DIAGNOSIS — S99912A Unspecified injury of left ankle, initial encounter: Secondary | ICD-10-CM | POA: Diagnosis present

## 2020-06-29 DIAGNOSIS — Z79899 Other long term (current) drug therapy: Secondary | ICD-10-CM | POA: Insufficient documentation

## 2020-06-29 DIAGNOSIS — I10 Essential (primary) hypertension: Secondary | ICD-10-CM | POA: Diagnosis not present

## 2020-06-29 DIAGNOSIS — J45909 Unspecified asthma, uncomplicated: Secondary | ICD-10-CM | POA: Insufficient documentation

## 2020-06-29 DIAGNOSIS — Z7951 Long term (current) use of inhaled steroids: Secondary | ICD-10-CM | POA: Diagnosis not present

## 2020-06-29 DIAGNOSIS — J449 Chronic obstructive pulmonary disease, unspecified: Secondary | ICD-10-CM | POA: Insufficient documentation

## 2020-06-29 DIAGNOSIS — F17210 Nicotine dependence, cigarettes, uncomplicated: Secondary | ICD-10-CM | POA: Diagnosis not present

## 2020-06-29 DIAGNOSIS — W1839XA Other fall on same level, initial encounter: Secondary | ICD-10-CM | POA: Insufficient documentation

## 2020-06-29 DIAGNOSIS — Z7982 Long term (current) use of aspirin: Secondary | ICD-10-CM | POA: Insufficient documentation

## 2020-06-29 DIAGNOSIS — S82832A Other fracture of upper and lower end of left fibula, initial encounter for closed fracture: Secondary | ICD-10-CM | POA: Diagnosis not present

## 2020-06-29 DIAGNOSIS — S82839A Other fracture of upper and lower end of unspecified fibula, initial encounter for closed fracture: Secondary | ICD-10-CM

## 2020-06-29 MED ORDER — HYDROCODONE-ACETAMINOPHEN 5-325 MG PO TABS: 1.0000 | ORAL_TABLET | Freq: Three times a day (TID) | ORAL | 0 refills | Status: DC | PRN

## 2020-06-29 NOTE — ED Triage Notes (Signed)
Pt c/o L foot pain after a fall. Pt c/o mechanical fall, states twisted her ankle trying to get to her truck. Pt with noted swelling to L ankle.

## 2020-06-29 NOTE — Discharge Instructions (Addendum)
You have a mild avulsion fracture to the lateral fibula.  This is treated with immobilization.  Rest with the foot elevated, and apply ice when necessary.  Take the prescription pain medicine only as needed.  Follow-up with Dr. Excell Seltzer for ongoing symptoms.

## 2020-06-29 NOTE — ED Provider Notes (Addendum)
[facility] Emergency Department Provider Note ____________________________________________  Time seen: 1322  I have reviewed the triage vital signs and the nursing notes.  HISTORY  Chief Complaint  Foot Pain  HPI Jocelyn Simon is a 55 y.o. female presents to the ED for evaluation of her left ankle.  She presents after mechanical fall where she fell twisting her left ankle try to get into her truck.  She presents with significant swelling to the lateral ankle which is somewhat improved from initial onset.  Patient denies any other injury at this time.  Past Medical History:  Diagnosis Date  . Anxiety   . Asthma   . COPD (chronic obstructive pulmonary disease) ([facility])   . Dyspnea    with exertion  . GERD (gastroesophageal reflux disease)   . Headache   . Hyperlipidemia   . Hypertension   . Stroke ([facility])   . Vertigo     Patient Active Problem List   Diagnosis Date Noted  . Chronic sore throat 09/12/2016  . Essential hypertension 07/18/2016  . Hyperlipidemia 07/18/2016  . Gastroesophageal reflux disease without esophagitis 07/18/2016  . Chronic obstructive pulmonary disease ([facility]) 07/18/2016  . Pelvic pain in female 12/20/2015  . Prediabetes 09/19/2015  . Left-sided weakness 04/07/2015    Past Surgical History:  Procedure Laterality Date  . BREAST BIOPSY Left 12/10/2016   papilloma  . BREAST EXCISIONAL BIOPSY Left 01/07/2017   excision of papilloma  . BREAST LUMPECTOMY WITH NEEDLE LOCALIZATION Left 01/07/2017   Procedure: BREAST LUMPECTOMY WITH NEEDLE LOCALIZATION;  Surgeon: Kieth Brightly, MD;  Location: [facility] ORS;  Service: General;  Laterality: Left;  . LAPAROSCOPIC SALPINGO OOPHERECTOMY Bilateral 12/20/2015   Procedure: LAPAROSCOPIC BILATERAL SALPINGO OOPHORECTOMY WITH PELVIC WASHINGS;  Surgeon: Christeen Douglas, MD;  Location: [facility] ORS;  Service: Gynecology;  Laterality: Bilateral;  . TUBAL LIGATION      Prior to Admission medications    Medication Sig Start Date End Date Taking? Authorizing Provider  HYDROcodone-acetaminophen (NORCO) 5-325 MG tablet Take 1 tablet by mouth 3 (three) times daily as needed. 06/29/20  Yes Kenzy Campoverde, Charlesetta Ivory, PA-C  albuterol (PROVENTIL) (2.5 MG/3ML) 0.083% nebulizer solution Take 2.5 mg by nebulization every 6 (six) hours as needed for wheezing or shortness of breath.    [provider]  albuterol (VENTOLIN HFA) 108 (90 Base) MCG/ACT inhaler INHALE 2 PUFFS EVERY SIX HOURS AS NEEDED FOR COUGH, WHEEZING OR SHORTNESS OF BREATH. 08/15/16   Doles-Johnson, Teah, NP  albuterol (VENTOLIN HFA) 108 (90 Base) MCG/ACT inhaler Inhale 2 puffs into the lungs every 4 (four) hours as needed for wheezing or shortness of breath. 06/23/19   Irean Hong, MD  amLODipine (NORVASC) 5 MG tablet Take 1 tablet (5 mg total) by mouth daily. Patient taking differently: Take 5 mg by mouth at bedtime.  08/15/16   Doles-Johnson, Teah, NP  aspirin EC 81 MG tablet Take 1 tablet (81 mg total) by mouth daily. 02/15/16   Dorothey Baseman, MD  azithromycin (ZITHROMAX Z-PAK) 250 MG tablet 2 tablets day #1, then 1 tablet daily until finished 06/23/19   Irean Hong, MD  butalbital-acetaminophen-caffeine (FIORICET) [id number] MG tablet Take 1-2 tablets by mouth every 6 (six) hours as needed for headache. 05/18/20 05/18/21  Jene Every, MD  chlorpheniramine-HYDROcodone (TUSSIONEX PENNKINETIC ER) 10-8 MG/5ML SUER Take 5 mLs by mouth 2 (two) times daily. 06/23/19   Irean Hong, MD  clopidogrel (PLAVIX) 75 MG tablet Take 1 tablet (75 mg total) by mouth daily. 04/01/16  Henreitta Leber, MD  dicyclomine (BENTYL) 10 MG capsule Take 1 capsule (10 mg total) by mouth 4 (four) times daily for 14 days. 10/04/19 10/18/19  Lavonia Drafts, MD  estradiol (ESTRACE) 1 MG tablet Take 1 mg by mouth daily.    [provider]  fluticasone (FLONASE) 50 MCG/ACT nasal spray Place 2 sprays into both nostrils daily. 11/30/18 11/30/19  Johnn Hai,  PA-C  hydrochlorothiazide (HYDRODIURIL) 25 MG tablet Take 1 tablet (25 mg total) by mouth daily. 07/18/16   Doles-Johnson, Teah, NP  ondansetron (ZOFRAN ODT) 4 MG disintegrating tablet Take 1 tablet (4 mg total) by mouth every 8 (eight) hours as needed. 01/03/20   Lavonia Drafts, MD  pantoprazole (PROTONIX) 20 MG tablet Take 1 tablet (20 mg total) by mouth daily. 01/03/20 01/02/21  Lavonia Drafts, MD  predniSONE (DELTASONE) 20 MG tablet 3 tablets daily x5 days 06/23/19   Paulette Blanch, MD  sucralfate (CARAFATE) 1 g tablet Take 1 tablet (1 g total) by mouth 4 (four) times daily. 01/03/20 01/02/21  Lavonia Drafts, MD    Allergies Patient has no known allergies.  Family History  Problem Relation Age of Onset  . CAD Mother   . CAD Sister   . CVA Other   . Breast cancer Neg Hx     Social History Social History   Tobacco Use  . Smoking status: Current Every Day Smoker    Packs/day: 0.25    Types: Cigarettes  . Smokeless tobacco: Never Used  . Tobacco comment: interested in restarting Nicotrol inhalers   Vaping Use  . Vaping Use: Never used  Substance Use Topics  . Alcohol use: No  . Drug use: No    Review of Systems  Constitutional: Negative for fever. Cardiovascular: Negative for chest pain. Respiratory: Negative for shortness of breath. Gastrointestinal: Negative for abdominal pain, vomiting and diarrhea. Genitourinary: Negative for dysuria. Musculoskeletal: Negative for back pain.  Left ankle pain as above. Skin: Negative for rash. Neurological: Negative for headaches, focal weakness or numbness. ____________________________________________  PHYSICAL EXAM:  VITAL SIGNS: ED Triage Vitals  Enc Vitals Group     BP 06/29/20 1218 (!) 153/87     Pulse Rate 06/29/20 1218 72     Resp 06/29/20 1218 18     Temp 06/29/20 1218 98.8 F (37.1 C)     Temp Source 06/29/20 1218 Oral     SpO2 06/29/20 1218 98 %     Weight 06/29/20 1243 201 lb 15.1 oz (91.6 kg)     Height 06/29/20 1243  5\' 1"  (1.549 m)     Head Circumference --      Peak Flow --      Pain Score 06/29/20 1243 10     Pain Loc --      Pain Edu? --      Excl. in [place]? --     Constitutional: Alert and oriented. Well appearing and in no distress. Head: Normocephalic and atraumatic. Eyes: Conjunctivae are normal. Normal extraocular movements Cardiovascular: Normal rate, regular rhythm. Normal distal pulses. Respiratory: Normal respiratory effort. No wheezes/rales/rhonchi. Musculoskeletal: Left ankle with significant lateral soft tissue swelling at the malleolus.  Patient is only mildly tender to palpation of the same region.  She is able demonstrate normal ankle flexion and extension range on exam.  achilles tendon is noted distally.  Nontender with normal range of motion in all extremities.  Neurologic: Antalgic gait without ataxia. Normal gross sensation. No gross focal neurologic deficits are appreciated. Skin:  Skin is warm, dry and intact. No rash noted. Psychiatric: Mood and affect are normal. Patient exhibits appropriate insight and judgment. ___________________________________________   RADIOLOGY  DG Left Ankle IMPRESSION: Minimally displaced distal fibular avulsion fracture with overlying soft tissue swelling.  I, Melvenia Needles, personally viewed and evaluated these images (plain radiographs) as part of my medical decision making, as well as reviewing the written report by the radiologist. ____________________________________________  PROCEDURES  Stirrup Aircast Crutches  Procedures ____________________________________________  INITIAL IMPRESSION / ASSESSMENT AND PLAN / ED COURSE  Patient ED evaluation of injury and initial fracture care to the left ankle following a mechanical fall.  Patient presents with significant swelling to the lateral malleolus.  X-ray was performed which revealed a mildly displaced avulsion fracture to the distal fibula.  Patient is placed in a stirrup splint  for support and given crutches ambulating weightbearing as tolerated.  She is referred to podiatry for ongoing fracture care.  Return precautions have been discussed.  Jocelyn Simon was evaluated in Emergency Department on 06/29/2020 for the symptoms described in the history of present illness. She was evaluated in the context of the global COVID-19 pandemic, which necessitated consideration that the patient might be at risk for infection with the SARS-CoV-2 virus that causes COVID-19. Institutional protocols and algorithms that pertain to the evaluation of patients at risk for COVID-19 are in a state of rapid change based on information released by regulatory bodies including the CDC and federal and state organizations. These policies and algorithms were followed during the patient's care in the ED.  I reviewed the patient's prescription history over the last 12 months in the multi-state controlled substances database(s) that includes [place], [place], [place], [place], [place], [place], [place], [place], [place], [place], [place], [place], [place], and [place].  Results were notable for no current RX.  ____________________________________________  FINAL CLINICAL IMPRESSION(S) / ED DIAGNOSES  Final diagnoses:  Avulsion fracture of distal fibula      Melvenia Needles, PA-C 06/29/20 1550    Tan Clopper, Dannielle Karvonen, PA-C 06/29/20 1559    Delman Kitten, MD 06/29/20 2151

## 2020-10-29 ENCOUNTER — Encounter: Payer: Self-pay | Admitting: Emergency Medicine

## 2020-10-29 ENCOUNTER — Other Ambulatory Visit: Payer: Self-pay

## 2020-10-29 ENCOUNTER — Emergency Department
Admission: EM | Admit: 2020-10-29 | Discharge: 2020-10-29 | Disposition: A | Discharge status: Home / Self Care | Payer: Medicaid Other | Attending: Emergency Medicine | Admitting: Emergency Medicine

## 2020-10-29 DIAGNOSIS — Z7982 Long term (current) use of aspirin: Secondary | ICD-10-CM | POA: Diagnosis not present

## 2020-10-29 DIAGNOSIS — J069 Acute upper respiratory infection, unspecified: Secondary | ICD-10-CM | POA: Insufficient documentation

## 2020-10-29 DIAGNOSIS — I10 Essential (primary) hypertension: Secondary | ICD-10-CM | POA: Insufficient documentation

## 2020-10-29 DIAGNOSIS — Z9012 Acquired absence of left breast and nipple: Secondary | ICD-10-CM | POA: Insufficient documentation

## 2020-10-29 DIAGNOSIS — Z79899 Other long term (current) drug therapy: Secondary | ICD-10-CM | POA: Diagnosis not present

## 2020-10-29 DIAGNOSIS — R059 Cough, unspecified: Secondary | ICD-10-CM | POA: Diagnosis present

## 2020-10-29 DIAGNOSIS — J449 Chronic obstructive pulmonary disease, unspecified: Secondary | ICD-10-CM | POA: Diagnosis not present

## 2020-10-29 DIAGNOSIS — M791 Myalgia, unspecified site: Secondary | ICD-10-CM | POA: Insufficient documentation

## 2020-10-29 DIAGNOSIS — J45909 Unspecified asthma, uncomplicated: Secondary | ICD-10-CM | POA: Insufficient documentation

## 2020-10-29 MED ORDER — ONDANSETRON 4 MG PO TBDP: 4.0000 mg | ORAL_TABLET | Freq: Three times a day (TID) | ORAL | 0 refills | Status: DC | PRN

## 2020-10-29 MED ORDER — PSEUDOEPH-BROMPHEN-DM 30-2-10 MG/5ML PO SYRP: 5.0000 mL | ORAL_SOLUTION | Freq: Four times a day (QID) | ORAL | 0 refills | Status: DC | PRN

## 2020-10-29 NOTE — Discharge Instructions (Addendum)
Follow-up with your primary care provider if any continued problems or concerns.  Increase fluids.  Take Bromfed-DM as needed for nasal congestion, head congestion and cough.  You may take Tylenol or ibuprofen with this medication if needed.

## 2020-10-29 NOTE — ED Triage Notes (Signed)
Pt reports headache, cough, congestion and bodyaches for 3 days. Pt reports she went to [facility] and they told her she had an upper resp infection but did not giver her any medication.

## 2020-10-29 NOTE — ED Provider Notes (Signed)
[facility] Emergency Department Provider Note  ____________________________________________   Event Date/Time   First MD Initiated Contact with Patient 10/29/20 [phone]     (approximate)  I have reviewed the triage vital signs and the nursing notes.   HISTORY  Chief Complaint Nasal Congestion, Headache, and Generalized Body Aches   HPI Jocelyn Simon is a 55 y.o. female presents to the ED with complaint of headache, cough, congestion and body aches for 3 days.  She states that she went to urgent care and was tested for COVID and influenza which was negative.  She states that she was not given any medication for her symptoms.  Patient denies any fever or chills at this time.  There is been no nausea or vomiting.       Past Medical History:  Diagnosis Date  . Anxiety   . Asthma   . COPD (chronic obstructive pulmonary disease) ([place])   . Dyspnea    with exertion  . GERD (gastroesophageal reflux disease)   . Headache   . Hyperlipidemia   . Hypertension   . Stroke ([place])   . Vertigo     Patient Active Problem List   Diagnosis Date Noted  . Chronic sore throat 09/12/2016  . Essential hypertension 07/18/2016  . Hyperlipidemia 07/18/2016  . Gastroesophageal reflux disease without esophagitis 07/18/2016  . Chronic obstructive pulmonary disease ([place]) 07/18/2016  . Pelvic pain in female 12/20/2015  . Prediabetes 09/19/2015  . Left-sided weakness 04/07/2015    Past Surgical History:  Procedure Laterality Date  . BREAST BIOPSY Left 12/10/2016   papilloma  . BREAST EXCISIONAL BIOPSY Left 01/07/2017   excision of papilloma  . BREAST LUMPECTOMY WITH NEEDLE LOCALIZATION Left 01/07/2017   Procedure: BREAST LUMPECTOMY WITH NEEDLE LOCALIZATION;  Surgeon: Christene Lye, MD;  Location: [facility] ORS;  Service: General;  Laterality: Left;  . LAPAROSCOPIC SALPINGO OOPHERECTOMY Bilateral 12/20/2015   Procedure: LAPAROSCOPIC BILATERAL SALPINGO OOPHORECTOMY  WITH PELVIC WASHINGS;  Surgeon: Benjaman Kindler, MD;  Location: [facility] ORS;  Service: Gynecology;  Laterality: Bilateral;  . TUBAL LIGATION      Prior to Admission medications   Medication Sig Start Date End Date Taking? Authorizing Provider  brompheniramine-pseudoephedrine-DM 30-2-10 MG/5ML syrup Take 5 mLs by mouth 4 (four) times daily as needed. 10/29/20  Yes Letitia Neri L, PA-C  ondansetron (ZOFRAN ODT) 4 MG disintegrating tablet Take 1 tablet (4 mg total) by mouth every 8 (eight) hours as needed for nausea or vomiting. 10/29/20  Yes Letitia Neri L, PA-C  albuterol (PROVENTIL) (2.5 MG/3ML) 0.083% nebulizer solution Take 2.5 mg by nebulization every 6 (six) hours as needed for wheezing or shortness of breath.    [provider]  albuterol (VENTOLIN HFA) 108 (90 Base) MCG/ACT inhaler INHALE 2 PUFFS EVERY SIX HOURS AS NEEDED FOR COUGH, WHEEZING OR SHORTNESS OF BREATH. 08/15/16   Doles-Johnson, Teah, NP  albuterol (VENTOLIN HFA) 108 (90 Base) MCG/ACT inhaler Inhale 2 puffs into the lungs every 4 (four) hours as needed for wheezing or shortness of breath. 06/23/19   Paulette Blanch, MD  amLODipine (NORVASC) 5 MG tablet Take 1 tablet (5 mg total) by mouth daily. Patient taking differently: Take 5 mg by mouth at bedtime.  08/15/16   Doles-Johnson, Teah, NP  aspirin EC 81 MG tablet Take 1 tablet (81 mg total) by mouth daily. 02/15/16   Juluis Pitch, MD  azithromycin (ZITHROMAX Z-PAK) 250 MG tablet 2 tablets day #1, then 1 tablet daily until finished 06/23/19  Paulette Blanch, MD  butalbital-acetaminophen-caffeine (FIORICET) [phone] MG tablet Take 1-2 tablets by mouth every 6 (six) hours as needed for headache. 05/18/20 05/18/21  Lavonia Drafts, MD  chlorpheniramine-HYDROcodone (TUSSIONEX PENNKINETIC ER) 10-8 MG/5ML SUER Take 5 mLs by mouth 2 (two) times daily. 06/23/19   Paulette Blanch, MD  clopidogrel (PLAVIX) 75 MG tablet Take 1 tablet (75 mg total) by mouth daily. 04/01/16   Henreitta Leber,  MD  dicyclomine (BENTYL) 10 MG capsule Take 1 capsule (10 mg total) by mouth 4 (four) times daily for 14 days. 10/04/19 10/18/19  Lavonia Drafts, MD  estradiol (ESTRACE) 1 MG tablet Take 1 mg by mouth daily.    [provider]  fluticasone (FLONASE) 50 MCG/ACT nasal spray Place 2 sprays into both nostrils daily. 11/30/18 11/30/19  Johnn Hai, PA-C  hydrochlorothiazide (HYDRODIURIL) 25 MG tablet Take 1 tablet (25 mg total) by mouth daily. 07/18/16   Doles-Johnson, Teah, NP  HYDROcodone-acetaminophen (NORCO) 5-325 MG tablet Take 1 tablet by mouth 3 (three) times daily as needed. 06/29/20   Menshew, Dannielle Karvonen, PA-C  pantoprazole (PROTONIX) 20 MG tablet Take 1 tablet (20 mg total) by mouth daily. 01/03/20 01/02/21  Lavonia Drafts, MD  predniSONE (DELTASONE) 20 MG tablet 3 tablets daily x5 days 06/23/19   Paulette Blanch, MD  sucralfate (CARAFATE) 1 g tablet Take 1 tablet (1 g total) by mouth 4 (four) times daily. 01/03/20 01/02/21  Lavonia Drafts, MD    Allergies Patient has no known allergies.  Family History  Problem Relation Age of Onset  . CAD Mother   . CAD Sister   . CVA Other   . Breast cancer Neg Hx     Social History Social History   Tobacco Use  . Smoking status: Current Every Day Smoker    Packs/day: 0.25    Types: Cigarettes  . Smokeless tobacco: Never Used  . Tobacco comment: interested in restarting Nicotrol inhalers   Vaping Use  . Vaping Use: Never used  Substance Use Topics  . Alcohol use: No  . Drug use: No    Review of Systems Constitutional: No fever/chills Eyes: No visual changes. ENT: No sore throat.  Positive nasal congestion. Cardiovascular: Denies chest pain. Respiratory: Denies shortness of breath.  Positive for cough. Gastrointestinal: No abdominal pain.  No nausea, no vomiting.  No diarrhea.  No constipation. Genitourinary: Negative for dysuria. Musculoskeletal: Positive for muscle aches. Skin: Negative for rash. Neurological: Positive for  headache, negative for focal weakness or numbness. ____________________________________________   PHYSICAL EXAM:  VITAL SIGNS: ED Triage Vitals [10/29/20 0925]  Enc Vitals Group     BP      Pulse      Resp      Temp      Temp src      SpO2      Weight 197 lb (89.4 kg)     Height 5\' 1"  (1.549 m)     Head Circumference      Peak Flow      Pain Score 10     Pain Loc      Pain Edu?      Excl. in [place]?    Constitutional: Alert and oriented. Well appearing and in no acute distress.  Patient is able to talk in complete sentences without any difficulty. Eyes: Conjunctivae are normal. PERRL. EOMI. Head: Atraumatic. Nose: Minimal congestion/rhinnorhea. Mouth/Throat: Mucous membranes are moist.  Oropharynx non-erythematous. Neck: No stridor.   Cardiovascular: Normal rate, regular  rhythm. Grossly normal heart sounds.  Good peripheral circulation. Respiratory: Normal respiratory effort.  No retractions. Lungs CTAB. Gastrointestinal: Soft and nontender. No distention.  Bowel sounds normoactive x4 quadrants. Musculoskeletal: No lower extremity tenderness nor edema.  Neurologic:  Normal speech and language. No gross focal neurologic deficits are appreciated.  Skin:  Skin is warm, dry and intact. No rash noted. Psychiatric: Mood and affect are normal. Speech and behavior are normal.  ____________________________________________   LABS (all labs ordered are listed, but only abnormal results are displayed)  Labs Reviewed - No data to display ____________________________________________    PROCEDURES  Procedure(s) performed (including Critical Care):  Procedures   ____________________________________________   INITIAL IMPRESSION / ASSESSMENT AND PLAN / ED COURSE  As part of my medical decision making, I reviewed the following data within the electronic MEDICAL RECORD NUMBER Notes from prior ED visits and [place] Controlled Substance Database  55 year old female presents to the ED with  complaint of headache, cough, congestion and body aches for 3 days.  Patient states that she went to urgent care where she tested negative for both COVID and influenza.  Patient states that she was not given any medications for her symptoms.  Physical exam was benign with the exception of some congestion.  No cough was noted during exam.  A prescription for Zofran as needed and Bromfed-DM was given.  ____________________________________________   FINAL CLINICAL IMPRESSION(S) / ED DIAGNOSES  Final diagnoses:  Viral URI with cough     ED Discharge Orders         Ordered    brompheniramine-pseudoephedrine-DM 30-2-10 MG/5ML syrup  4 times daily PRN        10/29/20 1029    ondansetron (ZOFRAN ODT) 4 MG disintegrating tablet  Every 8 hours PRN        10/29/20 1040          *Please note:  HAVA MASSINGALE was evaluated in Emergency Department on 10/29/2020 for the symptoms described in the history of present illness. She was evaluated in the context of the global COVID-19 pandemic, which necessitated consideration that the patient might be at risk for infection with the SARS-CoV-2 virus that causes COVID-19. Institutional protocols and algorithms that pertain to the evaluation of patients at risk for COVID-19 are in a state of rapid change based on information released by regulatory bodies including the CDC and federal and state organizations. These policies and algorithms were followed during the patient's care in the ED.  Some ED evaluations and interventions may be delayed as a result of limited staffing during and the pandemic.*   Note:  This document was prepared using Dragon voice recognition software and may include unintentional dictation errors.    Johnn Hai, PA-C 10/29/20 1310    Vladimir Crofts, MD 10/29/20 1414

## 2020-12-02 ENCOUNTER — Other Ambulatory Visit: Payer: Self-pay

## 2020-12-02 ENCOUNTER — Emergency Department
Admission: EM | Admit: 2020-12-02 | Discharge: 2020-12-02 | Disposition: A | Discharge status: Home / Self Care | Payer: Medicaid Other | Attending: Emergency Medicine | Admitting: Emergency Medicine

## 2020-12-02 ENCOUNTER — Emergency Department: Payer: Medicaid Other

## 2020-12-02 DIAGNOSIS — R202 Paresthesia of skin: Secondary | ICD-10-CM | POA: Diagnosis present

## 2020-12-02 DIAGNOSIS — Z5321 Procedure and treatment not carried out due to patient leaving prior to being seen by health care provider: Secondary | ICD-10-CM | POA: Diagnosis not present

## 2020-12-02 LAB — COMPREHENSIVE METABOLIC PANEL
ALT: 26 U/L (ref 0–44)
AST: 24 U/L (ref 15–41)
Albumin: 4.5 g/dL (ref 3.5–5.0)
Alkaline Phosphatase: 54 U/L (ref 38–126)
Anion gap: 9 (ref 5–15)
BUN: 23 mg/dL — ABNORMAL HIGH (ref 6–20)
CO2: 27 mmol/L (ref 22–32)
Calcium: 9.3 mg/dL (ref 8.9–10.3)
Chloride: 101 mmol/L (ref 98–111)
Creatinine, Ser: 0.73 mg/dL (ref 0.44–1.00)
GFR, Estimated: >60 mL/min (ref >60)
Glucose, Bld: 94 mg/dL (ref 70–99)
Potassium: 3.9 mmol/L (ref 3.5–5.1)
Sodium: 137 mmol/L (ref 135–145)
Total Bilirubin: 0.7 mg/dL (ref 0.3–1.2)
Total Protein: 8.4 g/dL — ABNORMAL HIGH (ref 6.5–8.1)

## 2020-12-02 LAB — DIFFERENTIAL
Abs Immature Granulocytes: 0.02 10*3/uL (ref 0.00–0.07)
Basophils Absolute: 0.1 10*3/uL (ref 0.0–0.1)
Basophils Relative: 1 %
Eosinophils Absolute: 0.3 10*3/uL (ref 0.0–0.5)
Eosinophils Relative: 5 %
Immature Granulocytes: 0 %
Lymphocytes Relative: 43 %
Lymphs Abs: 2.7 10*3/uL (ref 0.7–4.0)
Monocytes Absolute: 0.5 10*3/uL (ref 0.1–1.0)
Monocytes Relative: 8 %
Neutro Abs: 2.7 10*3/uL (ref 1.7–7.7)
Neutrophils Relative %: 43 %

## 2020-12-02 LAB — CBC
HCT: 38.7 % (ref 36.0–46.0)
Hemoglobin: 12.3 g/dL (ref 12.0–15.0)
MCH: 27.8 pg (ref 26.0–34.0)
MCHC: 31.8 g/dL (ref 30.0–36.0)
MCV: 87.6 fL (ref 80.0–100.0)
Platelets: 299 10*3/uL (ref 150–400)
RBC: 4.42 MIL/uL (ref 3.87–5.11)
RDW: 14.1 % (ref 11.5–15.5)
WBC: 6.2 10*3/uL (ref 4.0–10.5)
nRBC: 0 % (ref 0.0–0.2)

## 2020-12-02 LAB — CBG MONITORING, ED: Glucose-Capillary: 159 mg/dL — ABNORMAL HIGH (ref 70–99)

## 2020-12-02 LAB — APTT: aPTT: 29 seconds (ref 24–36)

## 2020-12-02 LAB — PROTIME-INR
INR: 0.9 (ref 0.8–1.2)
Prothrombin Time: 12.2 seconds (ref 11.4–15.2)

## 2020-12-02 MED ORDER — SODIUM CHLORIDE 0.9% FLUSH: 3.0000 mL | Freq: Once | INTRAVENOUS | Status: DC

## 2020-12-02 NOTE — ED Triage Notes (Signed)
Pt had MRI last week at [facility] and she advised they found fluid on her brain for results. Pt is having numbness on the left side of her head and it is bothering her eye sight as well. Pt is having some pain in her left temporal region as well. Pt ambulated to triage room. Pt in NAD. Numbness started at 0930.

## 2020-12-02 NOTE — ED Notes (Signed)
Used chart labels for blood. Printer in triage not working for labels.

## 2020-12-02 NOTE — ED Notes (Signed)
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 She went to the MD about her ear complaint about a week ago and the ENT is who sent her for MRI and found the fluid on her brain. So when she was eating breakfast today the sudden onset of this feeling in her head and the dizziness it scared her so she came straight to the ER. Her vision is blurred in the left eye as well.

## 2021-11-01 ENCOUNTER — Other Ambulatory Visit: Payer: Self-pay | Admitting: Family Medicine

## 2021-11-01 DIAGNOSIS — Z1231 Encounter for screening mammogram for malignant neoplasm of breast: Secondary | ICD-10-CM

## 2021-12-03 ENCOUNTER — Ambulatory Visit
Admission: RE | Admit: 2021-12-03 | Discharge: 2021-12-03 | Disposition: A | Discharge status: Home / Self Care | Payer: Medicaid Other | Source: Ambulatory Visit | Attending: Family Medicine | Admitting: Family Medicine

## 2021-12-03 DIAGNOSIS — Z1231 Encounter for screening mammogram for malignant neoplasm of breast: Secondary | ICD-10-CM | POA: Insufficient documentation

## 2021-12-12 ENCOUNTER — Inpatient Hospital Stay
Admission: RE | Admit: 2021-12-12 | Discharge: 2021-12-12 | Disposition: A | Discharge status: Home / Self Care | Payer: Self-pay | Source: Ambulatory Visit | Attending: *Deleted | Admitting: *Deleted

## 2021-12-12 ENCOUNTER — Other Ambulatory Visit: Payer: Self-pay | Admitting: *Deleted

## 2021-12-12 DIAGNOSIS — Z1231 Encounter for screening mammogram for malignant neoplasm of breast: Secondary | ICD-10-CM

## 2022-01-05 ENCOUNTER — Emergency Department
Admission: EM | Admit: 2022-01-05 | Discharge: 2022-01-05 | Disposition: A | Discharge status: Home / Self Care | Payer: Medicaid Other | Attending: Emergency Medicine | Admitting: Emergency Medicine

## 2022-01-05 ENCOUNTER — Emergency Department: Payer: Medicaid Other

## 2022-01-05 DIAGNOSIS — J209 Acute bronchitis, unspecified: Secondary | ICD-10-CM | POA: Insufficient documentation

## 2022-01-05 DIAGNOSIS — Z20822 Contact with and (suspected) exposure to covid-19: Secondary | ICD-10-CM | POA: Diagnosis not present

## 2022-01-05 DIAGNOSIS — R059 Cough, unspecified: Secondary | ICD-10-CM | POA: Diagnosis present

## 2022-01-05 LAB — SARS CORONAVIRUS 2 BY RT PCR: SARS Coronavirus 2 by RT PCR: NEGATIVE

## 2022-01-05 MED ORDER — PSEUDOEPH-BROMPHEN-DM 30-2-10 MG/5ML PO SYRP: 10.0000 mL | ORAL_SOLUTION | Freq: Four times a day (QID) | ORAL | 0 refills | Status: DC | PRN

## 2022-01-05 MED ORDER — BENZONATATE 100 MG PO CAPS: 100.0000 mg | ORAL_CAPSULE | Freq: Three times a day (TID) | ORAL | 0 refills | Status: AC | PRN

## 2022-01-05 MED ORDER — PREDNISONE 50 MG PO TABS: 50.0000 mg | ORAL_TABLET | Freq: Every day | ORAL | 0 refills | Status: DC

## 2022-01-05 MED ORDER — ONDANSETRON 4 MG PO TBDP: 4.0000 mg | ORAL_TABLET | Freq: Three times a day (TID) | ORAL | 0 refills | Status: DC | PRN

## 2022-01-05 NOTE — ED Notes (Signed)
See triage note. Lungs clear, breathing unlabored. Pt complains of nasal congestion and cough since 5 days. Pt also complaining of R shoulder pain since 1 week and wanting to be checked for this. States R shoulder is painful when extends arm backwards.

## 2022-01-05 NOTE — ED Notes (Signed)
Pt attempted to sign for d/c paperwork and education but topaz frozen.

## 2022-01-05 NOTE — ED Provider Notes (Signed)
[facility] Provider Note  Patient Contact: 3:06 PM (approximate)   History   Nasal Congestion and Cough   HPI  Jocelyn Simon is a 56 y.o. female who presents the emergency department complaining of cough x5 days.  No fevers, chills reported.  Patient has had cough which she states is intermittently productive.  No acute shortness of breath.  No nausea, vomiting, diarrhea.  Patient was around her sister who had similar symptoms last week.     Physical Exam   Triage Vital Signs: ED Triage Vitals  Enc Vitals Group     BP 01/05/22 1318 (!) 145/76     Pulse Rate 01/05/22 1318 72     Resp 01/05/22 1318 20     Temp 01/05/22 1318 98.2 F (36.8 C)     Temp Source 01/05/22 1318 Oral     SpO2 01/05/22 1318 96 %     Weight --      Height 01/05/22 1319 '5\' 1"'$  (1.549 m)     Head Circumference --      Peak Flow --      Pain Score 01/05/22 1319 10     Pain Loc --      Pain Edu? --      Excl. in [place]? --     Most recent vital signs: Vitals:   01/05/22 1318  BP: (!) 145/76  Pulse: 72  Resp: 20  Temp: 98.2 F (36.8 C)  SpO2: 96%     General: Alert and in no acute distress. ENT:      Ears:       Nose: No congestion/rhinnorhea.      Mouth/Throat: Mucous membranes are moist.  No gross oropharyngeal erythema or edema.  Uvula is midline. Neck: No stridor. No cervical spine tenderness to palpation.  Cardiovascular:  Good peripheral perfusion Respiratory: Normal respiratory effort without tachypnea or retractions. Lungs CTAB. Good air entry to the bases with no decreased or absent breath sounds. Musculoskeletal: Full range of motion to all extremities.  Neurologic:  No gross focal neurologic deficits are appreciated.  Skin:   No rash noted Other:   ED Results / Procedures / Treatments   Labs (all labs ordered are listed, but only abnormal results are displayed) Labs Reviewed  SARS CORONAVIRUS 2 BY RT PCR     EKG     RADIOLOGY    DG  Chest 2 View  Result Date: 01/05/2022 CLINICAL DATA:  Productive cough, wheezing EXAM: CHEST - 2 VIEW COMPARISON:  06/23/2019 FINDINGS: The heart size and mediastinal contours are within normal limits. Both lungs are clear. The visualized skeletal structures are unremarkable. IMPRESSION: No active cardiopulmonary disease. Electronically Signed   By: Elmer Picker M.D.   On: 01/05/2022 16:17    PROCEDURES:  Critical Care performed: No  Procedures   MEDICATIONS ORDERED IN ED: Medications - No data to display   IMPRESSION / MDM / [place] / ED COURSE  I reviewed the triage vital signs and the nursing notes.                              Differential diagnosis includes, but is not limited to, viral illness, COVID, bronchitis, pneumonia    Patient's presentation is most consistent with acute presentation with potential threat to life or bodily function.   Patient's diagnosis is consistent with bronchitis.  Patient presents to the ED complaining of cough which is  intermittently productive x5 days.  Patient was around her sister had similar symptoms prior to the onset of the patient's symptoms.  No difficulty breathing.  She does have a history of of COPD, does have an inhaler but has not been using same.  Patient denies any GI complaints or urinary complaints at this time.  Negative COVID test.  Chest x-ray is reassuring without acute cardiopulmonary finding.  Gait within the patient's symptoms I do suspect bronchitis.  Patient will be treated with prednisone, cough medications at this time..  Follow-up primary care as needed.  Patient is given ED precautions to return to the ED for any worsening or new symptoms.        FINAL CLINICAL IMPRESSION(S) / ED DIAGNOSES   Final diagnoses:  Acute bronchitis, unspecified organism     Rx / DC Orders   ED Discharge Orders          Ordered    predniSONE (DELTASONE) 50 MG tablet  Daily with breakfast        01/05/22 1729     brompheniramine-pseudoephedrine-DM 30-2-10 MG/5ML syrup  4 times daily PRN        01/05/22 1729    benzonatate (TESSALON PERLES) 100 MG capsule  3 times daily PRN        01/05/22 1729    ondansetron (ZOFRAN-ODT) 4 MG disintegrating tablet  Every 8 hours PRN        01/05/22 1729             Note:  This document was prepared using Dragon voice recognition software and may include unintentional dictation errors.   Brynda Peon 01/05/22 1731    Duffy Bruce, MD 01/05/22 2111

## 2022-01-05 NOTE — ED Triage Notes (Signed)
Patient to ER via POV with complaints of cough, sore throat, congestion, and fatigue x5 days. Reports productive cough. States she was around her sister that had a cold. Reports taking mucinex without relief.

## 2022-07-03 ENCOUNTER — Other Ambulatory Visit: Payer: Self-pay

## 2022-07-03 ENCOUNTER — Emergency Department
Admission: EM | Admit: 2022-07-03 | Discharge: 2022-07-03 | Disposition: A | Discharge status: Home / Self Care | Payer: Medicaid Other | Attending: Emergency Medicine | Admitting: Emergency Medicine

## 2022-07-03 ENCOUNTER — Emergency Department: Payer: Medicaid Other

## 2022-07-03 ENCOUNTER — Encounter: Payer: Self-pay | Admitting: *Deleted

## 2022-07-03 DIAGNOSIS — M79601 Pain in right arm: Secondary | ICD-10-CM

## 2022-07-03 DIAGNOSIS — R531 Weakness: Secondary | ICD-10-CM | POA: Insufficient documentation

## 2022-07-03 DIAGNOSIS — M79622 Pain in left upper arm: Secondary | ICD-10-CM | POA: Diagnosis not present

## 2022-07-03 DIAGNOSIS — I10 Essential (primary) hypertension: Secondary | ICD-10-CM | POA: Diagnosis not present

## 2022-07-03 DIAGNOSIS — M79621 Pain in right upper arm: Secondary | ICD-10-CM | POA: Insufficient documentation

## 2022-07-03 DIAGNOSIS — R0789 Other chest pain: Secondary | ICD-10-CM | POA: Diagnosis not present

## 2022-07-03 DIAGNOSIS — Z1152 Encounter for screening for COVID-19: Secondary | ICD-10-CM | POA: Insufficient documentation

## 2022-07-03 DIAGNOSIS — J449 Chronic obstructive pulmonary disease, unspecified: Secondary | ICD-10-CM | POA: Insufficient documentation

## 2022-07-03 DIAGNOSIS — R202 Paresthesia of skin: Secondary | ICD-10-CM | POA: Diagnosis not present

## 2022-07-03 LAB — TROPONIN I (HIGH SENSITIVITY): Troponin I (High Sensitivity): <2 ng/L (ref <18)

## 2022-07-03 LAB — CBC
HCT: 36.1 % (ref 36.0–46.0)
Hemoglobin: 11.7 g/dL — ABNORMAL LOW (ref 12.0–15.0)
MCH: 27.7 pg (ref 26.0–34.0)
MCHC: 32.4 g/dL (ref 30.0–36.0)
MCV: 85.5 fL (ref 80.0–100.0)
Platelets: 314 10*3/uL (ref 150–400)
RBC: 4.22 MIL/uL (ref 3.87–5.11)
RDW: 14.5 % (ref 11.5–15.5)
WBC: 7.7 10*3/uL (ref 4.0–10.5)
nRBC: 0 % (ref 0.0–0.2)

## 2022-07-03 LAB — BASIC METABOLIC PANEL
Anion gap: 10 (ref 5–15)
BUN: 16 mg/dL (ref 6–20)
CO2: 28 mmol/L (ref 22–32)
Calcium: 9 mg/dL (ref 8.9–10.3)
Chloride: 100 mmol/L (ref 98–111)
Creatinine, Ser: 0.76 mg/dL (ref 0.44–1.00)
GFR, Estimated: >60 mL/min (ref >60)
Glucose, Bld: 96 mg/dL (ref 70–99)
Potassium: 3.5 mmol/L (ref 3.5–5.1)
Sodium: 138 mmol/L (ref 135–145)

## 2022-07-03 MED ORDER — SUCRALFATE 1 G PO TABS
1.0000 g | ORAL_TABLET | Freq: Once | ORAL | Status: AC
  Administered 2022-07-03: 1 g via ORAL
  Filled 2022-07-03: qty 1

## 2022-07-03 MED ORDER — PANTOPRAZOLE SODIUM 40 MG PO TBEC
40.0000 mg | DELAYED_RELEASE_TABLET | Freq: Once | ORAL | Status: AC
Start: 2022-07-03 — End: 2022-07-03
  Administered 2022-07-03: 40 mg via ORAL
  Filled 2022-07-03: qty 1

## 2022-07-03 NOTE — ED Triage Notes (Signed)
Pt to triage via wheelchair.  Pt reports tingling in arms, chest .  Pt has chest pain.  Sx began tonight   Pt states she feels weak.  Pt also has nausea.  No sob.   Pt alert  speech clear.

## 2022-07-03 NOTE — ED Provider Notes (Signed)
[facility] Provider Note   Event Date/Time   First MD Initiated Contact with Patient 07/03/22 2151     (approximate) History  Weakness  HPI Jocelyn Simon is a 57 y.o. female with a stated past medical history of prediabetes, hypertension, and hyperlipidemia as well as COPD who presents for a tingling sensation to the chest and arms that began after a nap today and resolved spontaneously in approximately 30 seconds to 1 minute.  Patient states that this is similar to when she has had her foot fall asleep from sitting on it.  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  Patient denies any continued chest pain or paresthesias ROS: Patient currently denies any vision changes, tinnitus, difficulty speaking, facial droop, sore throat, chest pain, shortness of breath, abdominal pain, nausea/vomiting/diarrhea, dysuria, or weakness/numbness/paresthesias in any extremity   Physical Exam  Triage Vital Signs: ED Triage Vitals  Enc Vitals Group     BP 07/03/22 2120 (!) 163/90     Pulse Rate 07/03/22 2120 74     Resp 07/03/22 2120 20     Temp 07/03/22 2120 98.4 F (36.9 C)     Temp Source 07/03/22 2120 Oral     SpO2 07/03/22 2120 98 %     Weight 07/03/22 2117 198 lb (89.8 kg)     Height 07/03/22 2117 '5\' 1"'$  (1.549 m)     Head Circumference --      Peak Flow --      Pain Score 07/03/22 2117 10     Pain Loc --      Pain Edu? --      Excl. in [place]? --    Most recent vital signs: Vitals:   07/03/22 2120  BP: (!) 163/90  Pulse: 74  Resp: 20  Temp: 98.4 F (36.9 C)  SpO2: 98%   General: Awake, oriented x4. CV:  Good peripheral perfusion.  Resp:  Normal effort.  Abd:  No distention.  Other:  Middle-aged overweight African-American female sitting on stretcher in no acute distress ED Results / Procedures / Treatments  Labs (all labs ordered are listed, but only abnormal results are displayed) Labs Reviewed  CBC - Abnormal; Notable for the  following components:      Result Value   Hemoglobin 11.7 (*)    All other components within normal limits  BASIC METABOLIC PANEL  TROPONIN I (HIGH SENSITIVITY)  TROPONIN I (HIGH SENSITIVITY)   EKG ED ECG REPORT I, Naaman Plummer, the attending physician, personally viewed and interpreted this ECG. Date: 07/03/2022 EKG Time: 2119 Rate: 74 Rhythm: normal sinus rhythm QRS Axis: normal Intervals: normal ST/T Wave abnormalities: normal Narrative Interpretation: no evidence of acute ischemia RADIOLOGY ED MD interpretation: 2 view chest x-ray interpreted by me shows no evidence of acute abnormalities including no pneumonia, pneumothorax, or widened mediastinum -Agree with radiology assessment Official radiology report(s): DG Chest 2 View  Result Date: 07/03/2022 CLINICAL DATA:  Weakness EXAM: CHEST - 2 VIEW COMPARISON:  01/05/2022 FINDINGS: Lungs are well expanded, symmetric, and clear. No pneumothorax or pleural effusion. Cardiac size within normal limits. Pulmonary vascularity is normal. Osseous structures are age-appropriate. No acute bone abnormality. IMPRESSION: No active cardiopulmonary disease. Electronically Signed   By: Fidela Salisbury M.D.   On: 07/03/2022 21:57   PROCEDURES: Critical Care performed: No .1-3 Lead EKG Interpretation  Performed by: Naaman Plummer, MD Authorized by: Naaman Plummer, MD     Interpretation: normal  ECG rate:  71   ECG rate assessment: normal     Rhythm: sinus rhythm     Ectopy: none     Conduction: normal    MEDICATIONS ORDERED IN ED: Medications  sucralfate (CARAFATE) tablet 1 g (1 g Oral Given 07/03/22 2229)  pantoprazole (PROTONIX) EC tablet 40 mg (40 mg Oral Given 07/03/22 2229)   IMPRESSION / MDM / ASSESSMENT AND PLAN / ED COURSE  I reviewed the triage vital signs and the nursing notes.                             The patient is on the cardiac monitor to evaluate for evidence of arrhythmia and/or significant heart rate  changes. Patient's presentation is most consistent with acute presentation with potential threat to life or bodily function. 57 year old female presents for chest paresthesias Workup: ECG, CXR, CBC, BMP, Troponin Findings: ECG: No overt evidence of STEMI. No evidence of Brugadas sign, delta wave, epsilon wave, significantly prolonged QTc, or malignant arrhythmia HS Troponin: Negative x1 Other Labs unremarkable for emergent problems. CXR: Without PTX, PNA, or widened mediastinum Last Stress Test:  2017 Last Heart Catheterization: Never HEART Score: 3  Given History, Exam, and Workup I have low suspicion for ACS, Pneumothorax, Pneumonia, Pulmonary Embolus, Tamponade, Aortic Dissection or other emergent problem as a cause for this presentation.   Reassesment: Prior to discharge patients pain was controlled and they were well appearing.  Disposition:  Discharge. Strict return precautions discussed with patient with full understanding. Advised patient to follow up promptly with primary care provider    FINAL CLINICAL IMPRESSION(S) / ED DIAGNOSES   Final diagnoses:  Atypical chest pain  Paresthesia and pain of both upper extremities   Rx / DC Orders   ED Discharge Orders     None      Note:  This document was prepared using Dragon voice recognition software and may include unintentional dictation errors.   Naaman Plummer, MD 07/03/22 2312

## 2022-08-13 ENCOUNTER — Other Ambulatory Visit: Payer: Self-pay | Admitting: Otolaryngology

## 2022-08-13 DIAGNOSIS — R600 Localized edema: Secondary | ICD-10-CM

## 2022-08-19 ENCOUNTER — Ambulatory Visit: Admission: RE | Admit: 2022-08-19 | Payer: Medicaid Other | Source: Ambulatory Visit

## 2022-11-08 ENCOUNTER — Other Ambulatory Visit: Payer: Self-pay

## 2023-02-16 ENCOUNTER — Emergency Department
Admission: EM | Admit: 2023-02-16 | Discharge: 2023-02-16 | Disposition: A | Discharge status: Home / Self Care | Payer: Medicaid Other | Attending: Emergency Medicine | Admitting: Emergency Medicine

## 2023-02-16 ENCOUNTER — Other Ambulatory Visit: Payer: Self-pay

## 2023-02-16 DIAGNOSIS — U071 COVID-19: Secondary | ICD-10-CM

## 2023-02-16 DIAGNOSIS — R059 Cough, unspecified: Secondary | ICD-10-CM | POA: Diagnosis present

## 2023-02-16 LAB — RESP PANEL BY RT-PCR (RSV, FLU A&B, COVID)  RVPGX2
Influenza A by PCR: NEGATIVE
Influenza B by PCR: NEGATIVE
Resp Syncytial Virus by PCR: NEGATIVE
SARS Coronavirus 2 by RT PCR: POSITIVE — AB

## 2023-02-16 MED ORDER — PSEUDOEPH-BROMPHEN-DM 30-2-10 MG/5ML PO SYRP: 10.0000 mL | ORAL_SOLUTION | Freq: Four times a day (QID) | ORAL | 0 refills | Status: DC | PRN

## 2023-02-16 MED ORDER — BENZONATATE 100 MG PO CAPS
100.0000 mg | ORAL_CAPSULE | Freq: Three times a day (TID) | ORAL | 0 refills | Status: DC | PRN
Start: 2023-02-16 — End: 2023-11-06

## 2023-02-16 MED ORDER — ONDANSETRON 4 MG PO TBDP: 4.0000 mg | ORAL_TABLET | Freq: Three times a day (TID) | ORAL | 0 refills | Status: DC | PRN

## 2023-02-16 NOTE — ED Triage Notes (Signed)
Patient states cough, runny nose and congestion x 3 days; had positive Covid test at home.

## 2023-02-16 NOTE — ED Provider Notes (Signed)
[facility] Provider Note  Patient Contact: 5:26 PM (approximate)   History   Cough   HPI  Jocelyn Simon is a 57 y.o. female who presents emergency department with congestion, body aches, cough with a positive home COVID test.  Patient is here to confirm.  Patient has been sick for the last 2 to 3 days.  No reported fevers but very chilled.  Patient endorses nausea with diarrhea in addition to congestion and cough.  Patient denies any chest pain, difficulty breathing.  Patient is able to keep solids and liquids down.     Physical Exam   Triage Vital Signs: ED Triage Vitals  Encounter Vitals Group     BP 02/16/23 1610 (!) 149/88     Systolic BP Percentile --      Diastolic BP Percentile --      Pulse Rate 02/16/23 1610 84     Resp 02/16/23 1610 18     Temp 02/16/23 1610 100 F (37.8 C)     Temp Source 02/16/23 1610 Oral     SpO2 02/16/23 1610 98 %     Weight 02/16/23 1609 190 lb (86.2 kg)     Height 02/16/23 1609 5\' 1"  (1.549 m)     Head Circumference --      Peak Flow --      Pain Score 02/16/23 1609 9     Pain Loc --      Pain Education --      Exclude from Growth Chart --     Most recent vital signs: Vitals:   02/16/23 1610  BP: (!) 149/88  Pulse: 84  Resp: 18  Temp: 100 F (37.8 C)  SpO2: 98%     General: Alert and in no acute distress. ENT:      Ears:       Nose: No congestion/rhinnorhea.      Mouth/Throat: Mucous membranes are moist. Neck: No stridor. No cervical spine tenderness to palpation.  Cardiovascular:  Good peripheral perfusion Respiratory: Normal respiratory effort without tachypnea or retractions. Lungs CTAB. Good air entry to the bases with no decreased or absent breath sounds. Gastrointestinal: Bowel sounds 4 quadrants. Soft and nontender to palpation. No guarding or rigidity. No palpable masses. No distention.  Musculoskeletal: Full range of motion to all extremities.  Neurologic:  No gross focal  neurologic deficits are appreciated.  Skin:   No rash noted Other:   ED Results / Procedures / Treatments   Labs (all labs ordered are listed, but only abnormal results are displayed) Labs Reviewed  RESP PANEL BY RT-PCR (RSV, FLU A&B, COVID)  RVPGX2 - Abnormal; Notable for the following components:      Result Value   SARS Coronavirus 2 by RT PCR POSITIVE (*)    All other components within normal limits     EKG     RADIOLOGY    No results found.  PROCEDURES:  Critical Care performed: No  Procedures   MEDICATIONS ORDERED IN ED: Medications - No data to display   IMPRESSION / MDM / ASSESSMENT AND PLAN / ED COURSE  I reviewed the triage vital signs and the nursing notes.                                 Differential diagnosis includes, but is not limited to, COVID, viral illness, flu   Patient's presentation is most consistent with acute presentation  with potential threat to life or bodily function.   Patient's diagnosis is consistent with COVID.  Patient presents emergency department with a positive home COVID test.  Patient has symptoms consistent with COVID and it is confirmed with testing here.  At this time discussed symptom control at home and will prescribe nausea medication, cough medication for the patient.  Concerning signs and symptoms and return precautions discussed with the patient.  Follow-up primary care as needed.. Patient is given ED precautions to return to the ED for any worsening or new symptoms.     FINAL CLINICAL IMPRESSION(S) / ED DIAGNOSES   Final diagnoses:  COVID-19     Rx / DC Orders   ED Discharge Orders          Ordered    ondansetron (ZOFRAN-ODT) 4 MG disintegrating tablet  Every 8 hours PRN        02/16/23 1729    benzonatate (TESSALON PERLES) 100 MG capsule  3 times daily PRN        02/16/23 1729    brompheniramine-pseudoephedrine-DM 30-2-10 MG/5ML syrup  4 times daily PRN        02/16/23 1729              Note:  This document was prepared using Dragon voice recognition software and may include unintentional dictation errors.   Lanette Hampshire 02/16/23 1730    Jene Every, MD 02/16/23 Zollie Pee

## 2023-09-07 ENCOUNTER — Encounter: Payer: Self-pay | Admitting: Emergency Medicine

## 2023-09-07 ENCOUNTER — Emergency Department

## 2023-09-07 ENCOUNTER — Other Ambulatory Visit: Payer: Self-pay

## 2023-09-07 ENCOUNTER — Emergency Department
Admission: EM | Admit: 2023-09-07 | Discharge: 2023-09-08 | Disposition: A | Discharge status: Home / Self Care | Attending: Emergency Medicine | Admitting: Emergency Medicine

## 2023-09-07 DIAGNOSIS — D649 Anemia, unspecified: Secondary | ICD-10-CM | POA: Insufficient documentation

## 2023-09-07 DIAGNOSIS — R002 Palpitations: Secondary | ICD-10-CM | POA: Insufficient documentation

## 2023-09-07 DIAGNOSIS — I10 Essential (primary) hypertension: Secondary | ICD-10-CM | POA: Insufficient documentation

## 2023-09-07 DIAGNOSIS — R079 Chest pain, unspecified: Secondary | ICD-10-CM | POA: Diagnosis present

## 2023-09-07 DIAGNOSIS — Z8673 Personal history of transient ischemic attack (TIA), and cerebral infarction without residual deficits: Secondary | ICD-10-CM | POA: Insufficient documentation

## 2023-09-07 DIAGNOSIS — E119 Type 2 diabetes mellitus without complications: Secondary | ICD-10-CM | POA: Insufficient documentation

## 2023-09-07 DIAGNOSIS — E876 Hypokalemia: Secondary | ICD-10-CM | POA: Diagnosis not present

## 2023-09-07 LAB — BASIC METABOLIC PANEL
Anion gap: 12 (ref 5–15)
BUN: 14 mg/dL (ref 6–20)
CO2: 26 mmol/L (ref 22–32)
Calcium: 9 mg/dL (ref 8.9–10.3)
Chloride: 104 mmol/L (ref 98–111)
Creatinine, Ser: 0.67 mg/dL (ref 0.44–1.00)
GFR, Estimated: >60 mL/min (ref >60)
Glucose, Bld: 88 mg/dL (ref 70–99)
Potassium: 3.4 mmol/L — ABNORMAL LOW (ref 3.5–5.1)
Sodium: 142 mmol/L (ref 135–145)

## 2023-09-07 LAB — CBC
HCT: 34.9 % — ABNORMAL LOW (ref 36.0–46.0)
Hemoglobin: 11.4 g/dL — ABNORMAL LOW (ref 12.0–15.0)
MCH: 28.5 pg (ref 26.0–34.0)
MCHC: 32.7 g/dL (ref 30.0–36.0)
MCV: 87.3 fL (ref 80.0–100.0)
Platelets: 286 10*3/uL (ref 150–400)
RBC: 4 MIL/uL (ref 3.87–5.11)
RDW: 13.7 % (ref 11.5–15.5)
WBC: 6.2 10*3/uL (ref 4.0–10.5)
nRBC: 0 % (ref 0.0–0.2)

## 2023-09-07 LAB — TROPONIN I (HIGH SENSITIVITY)
Troponin I (High Sensitivity): 3 ng/L (ref <18)
Troponin I (High Sensitivity): 3 ng/L (ref <18)

## 2023-09-07 MED ORDER — POTASSIUM CHLORIDE CRYS ER 20 MEQ PO TBCR
40.0000 meq | EXTENDED_RELEASE_TABLET | Freq: Once | ORAL | Status: AC
  Administered 2023-09-07: 40 meq via ORAL
  Filled 2023-09-07: qty 2

## 2023-09-07 NOTE — ED Provider Notes (Signed)
 [facility] Provider Note    Event Date/Time   First MD Initiated Contact with Patient 09/07/23 2316     (approximate)   History   Chest Pain   HPI  Jocelyn Simon is a 58 year old female with history of SVT, HTN, T2DM, CVA with residual left-sided weakness presenting to the emergency department for evaluation of palpitations.  Reports longstanding history of intermittent palpitations, but these usually self resolved.  Today, she has had ongoing palpitations since around 11 AM.  Does report some associated chest discomfort with this.  Reports a mild associated shortness of breath.  Additionally reports some lightheadedness, no syncope.  No new numbness, tingling, focal weakness.       Physical Exam   Triage Vital Signs: ED Triage Vitals  Encounter Vitals Group     BP 09/07/23 2037 (!) 169/95     Systolic BP Percentile --      Diastolic BP Percentile --      Pulse Rate 09/07/23 2037 79     Resp 09/07/23 2037 18     Temp 09/07/23 2037 98.6 F (37 C)     Temp Source 09/07/23 2037 Oral     SpO2 09/07/23 2037 97 %     Weight 09/07/23 2033 195 lb (88.5 kg)     Height 09/07/23 2033 5\' 1"  (1.549 m)     Head Circumference --      Peak Flow --      Pain Score 09/07/23 2032 10     Pain Loc --      Pain Education --      Exclude from Growth Chart --     Most recent vital signs: Vitals:   09/08/23 0030 09/08/23 0100  BP: 138/81 (!) 140/89  Pulse: 70 71  Resp:  17  Temp:    SpO2: 100% 99%     General: Awake, interactive  CV:  Regular rate, good peripheral perfusion. Chest wall: No significant tenderness to palpation Resp:  Unlabored respirations, lungs clear to auscultation Abd:  Nondistended, soft, nontender to palpation Neuro:  Symmetric facial movement, fluid speech, mild weakness of left upper extremity compared to contralateral side, suspect likely chronic from prior CVA   ED Results / Procedures / Treatments   Labs (all labs  ordered are listed, but only abnormal results are displayed) Labs Reviewed  BASIC METABOLIC PANEL - Abnormal; Notable for the following components:      Result Value   Potassium 3.4 (*)    All other components within normal limits  CBC - Abnormal; Notable for the following components:   Hemoglobin 11.4 (*)    HCT 34.9 (*)    All other components within normal limits  D-DIMER, QUANTITATIVE  T4, FREE  MAGNESIUM  TSH  TROPONIN I (HIGH SENSITIVITY)  TROPONIN I (HIGH SENSITIVITY)     EKG EKG independently reviewed interpreted by myself (ER attending) demonstrates:  EKG demonstrates normal sinus rhythm rate of 79, PR 186, QRS 88, QTc 449, no acute ST changes  RADIOLOGY Imaging independently reviewed and interpreted by myself demonstrates:  CXR without focal consolidation  PROCEDURES:  Critical Care performed: No  Procedures   MEDICATIONS ORDERED IN ED: Medications  potassium chloride SA (KLOR-CON M) CR tablet 40 mEq (40 mEq Oral Given 09/07/23 2356)     IMPRESSION / MDM / ASSESSMENT AND PLAN / ED COURSE  I reviewed the triage vital signs and the nursing notes.  Differential diagnosis includes, but is not limited to,  arrhythmia, anemia, electrolyte abnormality, ACS, PE, pneumonia, pneumothorax  Patient's presentation is most consistent with acute presentation with potential threat to life or bodily function.  57 year old female presenting with chest pain and palpitations.  Stable vitals on presentation.  Has history of SVT, but in sinus rhythm here and reporting ongoing palpitations.  Labs from triage without critical derangements.  Mild hypokalemia noted, stable anemia.   Negative troponin x 2.  Will obtain additional labs including D-dimer, thyroid studies, magnesium.  If these are reassuring, suspect patient may be stable for discharge with outpatient follow-up.  Has previously seen cardiology at [facility].  1:24 AM Initial labs were reassuring.  Patient reevaluated and was  without any complaints.  Updated on results of workup.  She is comfortable with discharge home and outpatient follow-up with her primary care doctor and cardiology.  Strict return precautions provided.  Patient discharged stable condition.      FINAL CLINICAL IMPRESSION(S) / ED DIAGNOSES   Final diagnoses:  Nonspecific chest pain  Palpitations     Rx / DC Orders   ED Discharge Orders     None        Note:  This document was prepared using Dragon voice recognition software and may include unintentional dictation errors.   Trinna Post, MD 09/08/23 [phone]

## 2023-09-07 NOTE — ED Triage Notes (Signed)
 Pt to ED via POV with c/o sudden onset CP since 11am, pt also c/o SOB, palpiations, and pain that radiates down her L arm. Pt A&O x4, able to speak in full and complete sentences. States pain is a sharp pain.

## 2023-09-08 LAB — D-DIMER, QUANTITATIVE: D-Dimer, Quant: 0.41 ug{FEU}/mL (ref 0.00–0.50)

## 2023-09-08 LAB — TSH: TSH: 1.838 u[IU]/mL (ref 0.350–4.500)

## 2023-09-08 LAB — MAGNESIUM: Magnesium: 2.2 mg/dL (ref 1.7–2.4)

## 2023-09-08 LAB — T4, FREE: Free T4: 0.81 ng/dL (ref 0.61–1.12)

## 2023-09-08 MED ORDER — HYDROXYZINE HCL 25 MG PO TABS
25.0000 mg | ORAL_TABLET | Freq: Once | ORAL | Status: AC
  Administered 2023-09-08: 25 mg via ORAL
  Filled 2023-09-08: qty 1

## 2023-09-08 NOTE — Discharge Instructions (Signed)
 You were seen in the ER for your palpitations and chest pain.  Your testing was fortunately reassuring against an emergency cause for this.  Follow-up with your primary care doctor and cardiologist for further evaluation of your symptoms.  Return to the ER for new or worsening symptoms.

## 2023-10-05 ENCOUNTER — Other Ambulatory Visit: Payer: Self-pay

## 2023-10-05 ENCOUNTER — Emergency Department
Admission: EM | Admit: 2023-10-05 | Discharge: 2023-10-05 | Disposition: A | Discharge status: Home / Self Care | Attending: Emergency Medicine | Admitting: Emergency Medicine

## 2023-10-05 DIAGNOSIS — W01198A Fall on same level from slipping, tripping and stumbling with subsequent striking against other object, initial encounter: Secondary | ICD-10-CM | POA: Diagnosis not present

## 2023-10-05 DIAGNOSIS — Z23 Encounter for immunization: Secondary | ICD-10-CM | POA: Diagnosis not present

## 2023-10-05 DIAGNOSIS — J449 Chronic obstructive pulmonary disease, unspecified: Secondary | ICD-10-CM | POA: Diagnosis not present

## 2023-10-05 DIAGNOSIS — Z8673 Personal history of transient ischemic attack (TIA), and cerebral infarction without residual deficits: Secondary | ICD-10-CM | POA: Insufficient documentation

## 2023-10-05 DIAGNOSIS — I10 Essential (primary) hypertension: Secondary | ICD-10-CM | POA: Diagnosis not present

## 2023-10-05 DIAGNOSIS — S71111A Laceration without foreign body, right thigh, initial encounter: Secondary | ICD-10-CM | POA: Insufficient documentation

## 2023-10-05 DIAGNOSIS — S81811A Laceration without foreign body, right lower leg, initial encounter: Secondary | ICD-10-CM

## 2023-10-05 MED ORDER — NAPROXEN 500 MG PO TABS
500.0000 mg | ORAL_TABLET | Freq: Two times a day (BID) | ORAL | 2 refills | Status: DC
  Filled 2023-10-05: qty 60, 30d supply, fill #0

## 2023-10-05 MED ORDER — LIDOCAINE-EPINEPHRINE 1 %-1:100000 IJ SOLN
30.0000 mL | Freq: Once | INTRAMUSCULAR | Status: AC
  Administered 2023-10-05: 30 mL
  Filled 2023-10-05: qty 2

## 2023-10-05 MED ORDER — NAPROXEN 500 MG PO TABS: 500.0000 mg | ORAL_TABLET | Freq: Two times a day (BID) | ORAL | 2 refills | Status: DC

## 2023-10-05 MED ORDER — TETANUS-DIPHTH-ACELL PERTUSSIS 5-2.5-18.5 LF-MCG/0.5 IM SUSY
0.5000 mL | PREFILLED_SYRINGE | Freq: Once | INTRAMUSCULAR | Status: AC
  Administered 2023-10-05: 0.5 mL via INTRAMUSCULAR
  Filled 2023-10-05: qty 0.5

## 2023-10-05 NOTE — ED Triage Notes (Signed)
 Pt comes with c/o laceration noted to right upper thigh back side. Pt state she fell and cut it on something. Pt states no loc. Pt states it happened yesterday. Pt has large open wound with dried blood noted.

## 2023-10-05 NOTE — Discharge Instructions (Addendum)
 Stitches will need to be removed in 7 to 10 days.  This can be done by urgent care, the emergency department or your primary care provider.  Watch for signs of infection including redness, warmth, swelling, pain and pus drainage.  If you develop any of these please return to the ED, urgent care or your primary care provider.  Please wash the wound with soap and water daily, pat dry and cover with a bandage or gauze.  You can take the naproxen twice a day as needed for pain.  Return to the emergency department with any worsening symptoms.

## 2023-10-05 NOTE — ED Provider Notes (Signed)
 [facility] Provider Note    Event Date/Time   First MD Initiated Contact with Patient 10/05/23 1057     (approximate)   History   Laceration   HPI  Jocelyn Simon is a 58 y.o. female with PMH of hypertension, GERD, COPD, stroke, hyperlipidemia, asthma and anxiety who presents for evaluation of laceration to the right upper thigh.  Patient states that she cut it on a marble soap tray.  States that it happened late last night.  She has been pouring hydrogen peroxide on top of it to keep it clean.      Physical Exam   Triage Vital Signs: ED Triage Vitals  Encounter Vitals Group     BP 10/05/23 1026 (!) 168/100     Systolic BP Percentile --      Diastolic BP Percentile --      Pulse Rate 10/05/23 1026 94     Resp 10/05/23 1026 18     Temp 10/05/23 1026 98 F (36.7 C)     Temp src --      SpO2 10/05/23 1026 97 %     Weight 10/05/23 1129 194 lb 0.1 oz (88 kg)     Height 10/05/23 1129 5\' 1"  (1.549 m)     Head Circumference --      Peak Flow --      Pain Score 10/05/23 1025 10     Pain Loc --      Pain Education --      Exclude from Growth Chart --     Most recent vital signs: Vitals:   10/05/23 1026  BP: (!) 168/100  Pulse: 94  Resp: 18  Temp: 98 F (36.7 C)  SpO2: 97%   General: Awake, no distress.  CV:  Good peripheral perfusion.  Resp:  Normal effort.  Abd:  No distention.  Other:  Approximately 10 cm laceration to the lateral right thigh with subcuticular fat exposed, bleeding is controlled.   ED Results / Procedures / Treatments   Labs (all labs ordered are listed, but only abnormal results are displayed) Labs Reviewed - No data to display   PROCEDURES:  Critical Care performed: No  .Laceration Repair  Date/Time: 10/05/2023 12:28 PM  Performed by: Phyliss Breen, PA-C Authorized by: Phyliss Breen, PA-C   Consent:    Consent obtained:  Verbal   Consent given by:  Patient   Risks, benefits, and  alternatives were discussed: yes     Risks discussed:  Infection, pain and poor cosmetic result   Alternatives discussed:  No treatment Universal protocol:    Patient identity confirmed:  Verbally with patient Anesthesia:    Anesthesia method:  Local infiltration   Local anesthetic:  Lidocaine 1% WITH epi Laceration details:    Location:  Leg   Leg location:  R upper leg   Length (cm):  10   Depth (mm):  3 Pre-procedure details:    Preparation:  Patient was prepped and draped in usual sterile fashion Exploration:    Hemostasis achieved with:  Direct pressure   Wound exploration: entire depth of wound visualized   Treatment:    Area cleansed with:  Povidone-iodine   Amount of cleaning:  Standard   Irrigation solution:  Sterile saline   Irrigation method:  Syringe   Debridement:  Minimal Skin repair:    Repair method:  Sutures   Suture size:  4-0   Suture material:  Nylon   Suture technique:  Running and horizontal mattress   Number of sutures:  4 (3 mattress, 1 running) Approximation:    Approximation:  Close Repair type:    Repair type:  Simple Post-procedure details:    Dressing:  Bulky dressing   Procedure completion:  Tolerated well, no immediate complications    MEDICATIONS ORDERED IN ED: Medications  Tdap (BOOSTRIX) injection 0.5 mL (has no administration in time range)  lidocaine-EPINEPHrine (XYLOCAINE W/EPI) 1 %-1:100000 (with pres) injection 30 mL (30 mLs Infiltration Given 10/05/23 1153)     IMPRESSION / MDM / ASSESSMENT AND PLAN / ED COURSE  I reviewed the triage vital signs and the nursing notes.                             58 year old female presents for evaluation of laceration to the right thigh.  Blood pressure is elevated otherwise vital signs are stable.  Patient NAD on exam.  Differential diagnosis includes, but is not limited to, laceration, vascular injury, nerve injury.  Patient's presentation is most consistent with acute, uncomplicated  illness.  Laceration repaired as described in the procedure note above.  Patient was advised on wound care.  Stitches will need to be removed in 7-10 days.  Tetanus shot was updated today.  We reviewed signs of infection to watch for.  Patient voiced understanding, all questions were answered and she was stable at discharge.     FINAL CLINICAL IMPRESSION(S) / ED DIAGNOSES   Final diagnoses:  Laceration of right lower extremity, initial encounter     Rx / DC Orders   ED Discharge Orders          Ordered    naproxen (NAPROSYN) 500 MG tablet  2 times daily with meals        10/05/23 1234             Note:  This document was prepared using Dragon voice recognition software and may include unintentional dictation errors.   Phyliss Breen, PA-C 10/05/23 1235    Bryson Carbine, MD 10/05/23 1252

## 2023-11-06 ENCOUNTER — Other Ambulatory Visit: Payer: Self-pay

## 2023-11-06 ENCOUNTER — Emergency Department

## 2023-11-06 ENCOUNTER — Encounter: Payer: Self-pay | Admitting: Emergency Medicine

## 2023-11-06 ENCOUNTER — Emergency Department
Admission: EM | Admit: 2023-11-06 | Discharge: 2023-11-06 | Disposition: A | Discharge status: Home / Self Care | Attending: Emergency Medicine | Admitting: Emergency Medicine

## 2023-11-06 DIAGNOSIS — J45909 Unspecified asthma, uncomplicated: Secondary | ICD-10-CM | POA: Diagnosis not present

## 2023-11-06 DIAGNOSIS — Z8673 Personal history of transient ischemic attack (TIA), and cerebral infarction without residual deficits: Secondary | ICD-10-CM | POA: Diagnosis not present

## 2023-11-06 DIAGNOSIS — J449 Chronic obstructive pulmonary disease, unspecified: Secondary | ICD-10-CM | POA: Insufficient documentation

## 2023-11-06 DIAGNOSIS — J189 Pneumonia, unspecified organism: Secondary | ICD-10-CM | POA: Insufficient documentation

## 2023-11-06 DIAGNOSIS — I10 Essential (primary) hypertension: Secondary | ICD-10-CM | POA: Insufficient documentation

## 2023-11-06 DIAGNOSIS — R059 Cough, unspecified: Secondary | ICD-10-CM | POA: Diagnosis present

## 2023-11-06 LAB — RESP PANEL BY RT-PCR (RSV, FLU A&B, COVID)  RVPGX2
Influenza A by PCR: NEGATIVE
Influenza B by PCR: NEGATIVE
Resp Syncytial Virus by PCR: NEGATIVE
SARS Coronavirus 2 by RT PCR: NEGATIVE

## 2023-11-06 MED ORDER — BENZONATATE 100 MG PO CAPS: 100.0000 mg | ORAL_CAPSULE | Freq: Three times a day (TID) | ORAL | 0 refills | Status: DC | PRN

## 2023-11-06 MED ORDER — ONDANSETRON 4 MG PO TBDP: 4.0000 mg | ORAL_TABLET | Freq: Three times a day (TID) | ORAL | 0 refills | Status: DC | PRN

## 2023-11-06 MED ORDER — IPRATROPIUM-ALBUTEROL 0.5-2.5 (3) MG/3ML IN SOLN
3.0000 mL | Freq: Once | RESPIRATORY_TRACT | Status: AC
  Administered 2023-11-06: 3 mL via RESPIRATORY_TRACT
  Filled 2023-11-06: qty 3

## 2023-11-06 MED ORDER — AZITHROMYCIN 250 MG PO TABS: ORAL_TABLET | ORAL | 0 refills | Status: DC

## 2023-11-06 MED ORDER — ONDANSETRON 4 MG PO TBDP
4.0000 mg | ORAL_TABLET | Freq: Once | ORAL | Status: AC
  Administered 2023-11-06: 4 mg via ORAL
  Filled 2023-11-06: qty 1

## 2023-11-06 MED ORDER — ALBUTEROL SULFATE HFA 108 (90 BASE) MCG/ACT IN AERS: 2.0000 | INHALATION_SPRAY | Freq: Four times a day (QID) | RESPIRATORY_TRACT | 2 refills | Status: AC | PRN

## 2023-11-06 NOTE — Discharge Instructions (Addendum)
 Follow-up with your primary care provider or the urgent care listed on your discharge papers.  You may also follow-up with [facility] urgent care or return to the emergency department if needed worsening of your symptoms.  Prescriptions were sent to your pharmacy.  Antibiotic, cough medication, medication for nausea along with your inhaler that you can use every 6 hours as needed for wheezing.  Increase fluids to stay hydrated and take Tylenol  or ibuprofen  as needed for body aches and headache.

## 2023-11-06 NOTE — ED Triage Notes (Signed)
 Patient to ED via POV for headache, nausea, cough. States she's just not feeling well- generalized body aches. Ongoing x4 days but worse today. Taking cough medicine. Productive cough- yellow in color. NAD noted.

## 2023-11-06 NOTE — ED Provider Notes (Signed)
 [facility] Provider Note    Event Date/Time   First MD Initiated Contact with Patient 11/06/23 [phone]     (approximate)   History   Headache   HPI  Jocelyn Simon is a 58 y.o. female   presents to the ED with complaint of headache, nausea, cough, congestion and generalized bodyaches for 4 days which has gotten worse today.  Patient states she has been taking cough medication without any relief.  She is a former smoker and is worried that she may have pneumonia.  She feels she has had of subjective fever at home.  She denies any vomiting or diarrhea.  Patient has a history of hypertension, GERD with esophagitis, COPD, prediabetes, asthma and history of CVA.      Physical Exam   Triage Vital Signs: ED Triage Vitals  Encounter Vitals Group     BP 11/06/23 0945 130/84     Systolic BP Percentile --      Diastolic BP Percentile --      Pulse Rate 11/06/23 0945 78     Resp 11/06/23 0945 17     Temp 11/06/23 0945 98.6 F (37 C)     Temp Source 11/06/23 0945 Oral     SpO2 11/06/23 0945 93 %     Weight 11/06/23 0944 194 lb (88 kg)     Height 11/06/23 0944 5\' 1"  (1.549 m)     Head Circumference --      Peak Flow --      Pain Score 11/06/23 0944 10     Pain Loc --      Pain Education --      Exclude from Growth Chart --     Most recent vital signs: Vitals:   11/06/23 0945  BP: 130/84  Pulse: 78  Resp: 17  Temp: 98.6 F (37 C)  SpO2: 93%     General: Awake, no distress.  Patient is able to talk in complete sentences without any difficulty.  Answers questions appropriately. CV:  Good peripheral perfusion.  Heart regular rate and rhythm. Resp:  Normal effort.  Lungs with mild expiratory wheeze noted more on the left than right..  No rales or rhonchi. Abd:  No distention.  Other:     ED Results / Procedures / Treatments   Labs (all labs ordered are listed, but only abnormal results are displayed) Labs Reviewed  RESP PANEL BY RT-PCR (RSV,  FLU A&B, COVID)  RVPGX2     RADIOLOGY Chest x-ray images were reviewed by myself independent of the radiologist with questionable changes on the left.  Official radiology report is for possible infiltrate or pneumonia in the left midlung.   PROCEDURES:  Critical Care performed:   Procedures   MEDICATIONS ORDERED IN ED: Medications  ipratropium-albuterol  (DUONEB) 0.5-2.5 (3) MG/3ML nebulizer solution 3 mL (3 mLs Nebulization Given 11/06/23 1206)  ondansetron  (ZOFRAN -ODT) disintegrating tablet 4 mg (4 mg Oral Given 11/06/23 1231)     IMPRESSION / MDM / ASSESSMENT AND PLAN / ED COURSE  I reviewed the triage vital signs and the nursing notes.   Differential diagnosis includes, but is not limited to, COVID, influenza, RSV, bronchitis, viral illness, pneumonia, seasonal allergies.  58 year old female presents to the ED with complaint of bodyaches, headache, cough, nausea with coughing but no vomiting for the last 4 days.  Respiratory panel was negative however chest x-ray did show possibility of a possible pneumonia.  Patient was made aware.  A refill of her  albuterol  was sent to the pharmacy along with prescription for Zofran  as needed for nausea, Tessalon  Perles, and Zithromax  which she has taken in the past.  Patient is to follow-up with her PCP with [facility] for follow-up.  She is also strongly encouraged to return to the emergency department if there is any severe worsening of her symptoms.      Patient's presentation is most consistent with acute illness / injury with system symptoms.  FINAL CLINICAL IMPRESSION(S) / ED DIAGNOSES   Final diagnoses:  Community acquired pneumonia, unspecified laterality     Rx / DC Orders   ED Discharge Orders          Ordered    albuterol  (VENTOLIN  HFA) 108 (90 Base) MCG/ACT inhaler  Every 6 hours PRN        11/06/23 1221    azithromycin  (ZITHROMAX  Z-PAK) 250 MG tablet        11/06/23 1221    benzonatate  (TESSALON  PERLES)  100 MG capsule  3 times daily PRN        11/06/23 1221    ondansetron  (ZOFRAN -ODT) 4 MG disintegrating tablet  Every 8 hours PRN        11/06/23 1225             Note:  This document was prepared using Dragon voice recognition software and may include unintentional dictation errors.   [name], PA-C 11/06/23 1508    [name], MD 11/07/23 [phone]

## 2024-03-04 NOTE — Congregational Nurse Program (Signed)
 Patient to food [facility] for first visit. Stated she was at Urgent Care on 9/10 with right side pain that cramped in her abdomen and into back. Patient stated that she was referred to schedule an ultrasound through her PCP. Patient requested message be sent to PCP Almarie Mako.

## 2024-03-07 ENCOUNTER — Other Ambulatory Visit: Payer: Self-pay

## 2024-03-07 ENCOUNTER — Emergency Department

## 2024-03-07 ENCOUNTER — Emergency Department
Admission: EM | Admit: 2024-03-07 | Discharge: 2024-03-07 | Disposition: A | Discharge status: Home / Self Care | Attending: Emergency Medicine | Admitting: Emergency Medicine

## 2024-03-07 DIAGNOSIS — R109 Unspecified abdominal pain: Secondary | ICD-10-CM

## 2024-03-07 DIAGNOSIS — I10 Essential (primary) hypertension: Secondary | ICD-10-CM | POA: Diagnosis not present

## 2024-03-07 DIAGNOSIS — J449 Chronic obstructive pulmonary disease, unspecified: Secondary | ICD-10-CM | POA: Insufficient documentation

## 2024-03-07 DIAGNOSIS — R1011 Right upper quadrant pain: Secondary | ICD-10-CM | POA: Insufficient documentation

## 2024-03-07 DIAGNOSIS — R0781 Pleurodynia: Secondary | ICD-10-CM | POA: Diagnosis not present

## 2024-03-07 LAB — CBC
HCT: 36.4 % (ref 36.0–46.0)
Hemoglobin: 11.7 g/dL — ABNORMAL LOW (ref 12.0–15.0)
MCH: 27.8 pg (ref 26.0–34.0)
MCHC: 32.1 g/dL (ref 30.0–36.0)
MCV: 86.5 fL (ref 80.0–100.0)
Platelets: 293 K/uL (ref 150–400)
RBC: 4.21 MIL/uL (ref 3.87–5.11)
RDW: 14.3 % (ref 11.5–15.5)
WBC: 5.4 K/uL (ref 4.0–10.5)
nRBC: 0 % (ref 0.0–0.2)

## 2024-03-07 LAB — URINALYSIS, ROUTINE W REFLEX MICROSCOPIC
Bacteria, UA: NONE SEEN
Bilirubin Urine: NEGATIVE
Glucose, UA: NEGATIVE mg/dL
Hgb urine dipstick: NEGATIVE
Ketones, ur: NEGATIVE mg/dL
Leukocytes,Ua: NEGATIVE
Nitrite: NEGATIVE
Protein, ur: NEGATIVE mg/dL
Specific Gravity, Urine: 1.02 (ref 1.005–1.030)
pH: 6 (ref 5.0–8.0)

## 2024-03-07 LAB — BASIC METABOLIC PANEL WITH GFR
Anion gap: 11 (ref 5–15)
BUN: 20 mg/dL (ref 6–20)
CO2: 26 mmol/L (ref 22–32)
Calcium: 9.3 mg/dL (ref 8.9–10.3)
Chloride: 102 mmol/L (ref 98–111)
Creatinine, Ser: 0.6 mg/dL (ref 0.44–1.00)
GFR, Estimated: >60 mL/min (ref >60)
Glucose, Bld: 118 mg/dL — ABNORMAL HIGH (ref 70–99)
Potassium: 3.8 mmol/L (ref 3.5–5.1)
Sodium: 139 mmol/L (ref 135–145)

## 2024-03-07 LAB — LIPASE, BLOOD: Lipase: 40 U/L (ref 11–51)

## 2024-03-07 MED ORDER — ONDANSETRON HCL 4 MG/2ML IJ SOLN
4.0000 mg | Freq: Once | INTRAMUSCULAR | Status: AC
  Administered 2024-03-07: 4 mg via INTRAVENOUS
  Filled 2024-03-07: qty 2

## 2024-03-07 MED ORDER — LIDOCAINE 5 % EX PTCH: 1.0000 | MEDICATED_PATCH | Freq: Two times a day (BID) | CUTANEOUS | 0 refills | Status: AC

## 2024-03-07 MED ORDER — KETOROLAC TROMETHAMINE 15 MG/ML IJ SOLN
15.0000 mg | Freq: Once | INTRAMUSCULAR | Status: AC
  Administered 2024-03-07: 15 mg via INTRAVENOUS
  Filled 2024-03-07: qty 1

## 2024-03-07 MED ORDER — MORPHINE SULFATE (PF) 4 MG/ML IV SOLN
4.0000 mg | Freq: Once | INTRAVENOUS | Status: AC
  Administered 2024-03-07: 4 mg via INTRAVENOUS
  Filled 2024-03-07: qty 1

## 2024-03-07 MED ORDER — NAPROXEN 500 MG PO TABS: 500.0000 mg | ORAL_TABLET | Freq: Two times a day (BID) | ORAL | 0 refills | Status: AC

## 2024-03-07 MED ORDER — IOHEXOL 300 MG/ML  SOLN
100.0000 mL | Freq: Once | INTRAMUSCULAR | Status: AC | PRN
  Administered 2024-03-07: 100 mL via INTRAVENOUS

## 2024-03-07 NOTE — ED Triage Notes (Addendum)
 Pt to ED via POV for Right sided flank pain and goes across stomach and into back. Pt was seen at Urgent Care four days ago for same and was told if it did not get better to come here. Pt states unable to lay down without pain. Pt states no hx of kidney stones or gall stones. Pt does states she also feels SOB. Pt with even unlabored breathing at this time.

## 2024-03-07 NOTE — ED Notes (Signed)
Pt not in room yet.

## 2024-03-07 NOTE — ED Provider Notes (Signed)
 [facility] Provider Note    Event Date/Time   First MD Initiated Contact with Patient 03/07/24 1238     (approximate)   History   Flank Pain   HPI  Jocelyn Simon is a 58 y.o. female with a past medical history of hypertension, hyperlipidemia, COPD, GERD who presents today for evaluation of right sided flank pain.  Patient reports that this pain has been present for several weeks.  She reports that the pain radiates around the front of her abdomen and goes to her back as well.  She has not had any nausea or vomiting.  No fevers or chills.  No urinary symptoms.  No stool changes.  No chest pain.  Her husband reports that she has recently been lifting heavy boxes.  Patient Active Problem List   Diagnosis Date Noted   Chronic sore throat 09/12/2016   Essential hypertension 07/18/2016   Hyperlipidemia 07/18/2016   Gastroesophageal reflux disease without esophagitis 07/18/2016   Chronic obstructive pulmonary disease (HCC) 07/18/2016   Pelvic pain in female 12/20/2015   Prediabetes 09/19/2015   Left-sided weakness 04/07/2015          Physical Exam   Triage Vital Signs: ED Triage Vitals  Encounter Vitals Group     BP 03/07/24 1223 (!) 128/94     Girls Systolic BP Percentile --      Girls Diastolic BP Percentile --      Boys Systolic BP Percentile --      Boys Diastolic BP Percentile --      Pulse Rate 03/07/24 1223 79     Resp 03/07/24 1223 17     Temp 03/07/24 1223 98.4 F (36.9 C)     Temp Source 03/07/24 1223 Oral     SpO2 03/07/24 1223 95 %     Weight 03/07/24 1224 200 lb (90.7 kg)     Height 03/07/24 1224 5' 1 (1.549 m)     Head Circumference --      Peak Flow --      Pain Score 03/07/24 1224 10     Pain Loc --      Pain Education --      Exclude from Growth Chart --     Most recent vital signs: Vitals:   03/07/24 1223  BP: (!) 128/94  Pulse: 79  Resp: 17  Temp: 98.4 F (36.9 C)  SpO2: 95%    Physical Exam Vitals and  nursing note reviewed.  Constitutional:      General: Awake and alert. No acute distress.    Appearance: Normal appearance. The patient is normal weight.  HENT:     Head: Normocephalic and atraumatic.     Mouth: Mucous membranes are moist.  Eyes:     General: PERRL. Normal EOMs        Right eye: No discharge.        Left eye: No discharge.     Conjunctiva/sclera: Conjunctivae normal.  Cardiovascular:     Rate and Rhythm: Normal rate and regular rhythm.     Pulses: Normal pulses.  Pulmonary:     Effort: Pulmonary effort is normal. No respiratory distress.     Breath sounds: Normal breath sounds.  Abdominal:     Abdomen is soft. There is mild discomfort with gentle touch to her right side of her upper abdomen as well as to her right lateral ribs.  No rash or skin changes noted. No rebound or guarding. No distention. Musculoskeletal:  General: No swelling. Normal range of motion.     Cervical back: Normal range of motion and neck supple.  Skin:    General: Skin is warm and dry.     Capillary Refill: Capillary refill takes less than 2 seconds.     Findings: No rash.  Neurological:     Mental Status: The patient is awake and alert.      ED Results / Procedures / Treatments   Labs (all labs ordered are listed, but only abnormal results are displayed) Labs Reviewed  URINALYSIS, ROUTINE W REFLEX MICROSCOPIC - Abnormal; Notable for the following components:      Result Value   Color, Urine YELLOW (*)    APPearance CLEAR (*)    All other components within normal limits  BASIC METABOLIC PANEL WITH GFR - Abnormal; Notable for the following components:   Glucose, Bld 118 (*)    All other components within normal limits  CBC - Abnormal; Notable for the following components:   Hemoglobin 11.7 (*)    All other components within normal limits  LIPASE, BLOOD     EKG     RADIOLOGY I independently reviewed and interpreted imaging and agree with radiologists  findings.     PROCEDURES:  Critical Care performed:   Procedures   MEDICATIONS ORDERED IN ED: Medications  ondansetron  (ZOFRAN ) injection 4 mg (4 mg Intravenous Given 03/07/24 1325)  morphine  (PF) 4 MG/ML injection 4 mg (4 mg Intravenous Given 03/07/24 1326)  iohexol  (OMNIPAQUE ) 300 MG/ML solution 100 mL (100 mLs Intravenous Contrast Given 03/07/24 1343)  ketorolac  (TORADOL ) 15 MG/ML injection 15 mg (15 mg Intravenous Given 03/07/24 1433)     IMPRESSION / MDM / ASSESSMENT AND PLAN / ED COURSE  I reviewed the triage vital signs and the nursing notes.   Differential diagnosis includes, but is not limited to, cholecystitis, biliary colic, pancreatitis, costochondritis,.  Pulmonary abnormality.  Patient is awake and alert, hemodynamically stable and afebrile.  She is tenderness palpation to the right lateral ribs extending to her upper right flank area.  There is no rash noted.  She is nontoxic in appearance.  Her abdomen is otherwise soft and nontender throughout.  Further workup is indicated.  Labs obtained are overall quite reassuring.  Normal LFTs and lipase, no leukocytosis, normal and stable H&H.  Urine does not reveal any evidence of urinary tract infection.  Given her diffuse discomfort, CT scan obtained which is also normal.  She is treated symptomatically with improvement of her symptoms.  She has equal pulses in all 4 extremities, no chest pain or shortness of breath to suggest vascular catastrophe.  No chest pain, nausea, diaphoresis to suggest acute coronary syndrome.  She reports that she just darted a new job where she is lifting heavy things and is quite possible that she is having musculoskeletal etiology in the context of her normal labs and imaging.  We discussed symptomatic management and return precautions.  Patient understands and agrees with plan.  She was discharged in stable condition with her husband.   Patient's presentation is most consistent with acute complicated  illness / injury requiring diagnostic workup.      FINAL CLINICAL IMPRESSION(S) / ED DIAGNOSES   Final diagnoses:  Right flank pain     Rx / DC Orders   ED Discharge Orders          Ordered    naproxen  (NAPROSYN ) 500 MG tablet  2 times daily with meals  03/07/24 1427    lidocaine  (LIDODERM ) 5 %  Every 12 hours        03/07/24 1427             Note:  This document was prepared using Dragon voice recognition software and may include unintentional dictation errors.   Thanya Cegielski E, PA-C 03/07/24 1753    Levander Slate, MD 03/08/24 [phone]

## 2024-03-07 NOTE — Discharge Instructions (Signed)
 Please follow-up with your outpatient provider.  You may take the medications as prescribed.  Please return for any new, worsening, or changing symptoms or other concerns.  It was a pleasure caring for you today.

## 2024-04-15 LAB — GLUCOSE, POCT (MANUAL RESULT ENTRY): POC Glucose: 111 mg/dL — AB (ref 70–99)

## 2024-04-15 NOTE — Congregational Nurse Program (Signed)
  Dept: [phone]   Congregational Nurse Program Note  Date of Encounter: 04/15/2024  Past Medical History: Past Medical History:  Diagnosis Date   Anxiety    Asthma    COPD (chronic obstructive pulmonary disease) (HCC)    Dyspnea    with exertion   GERD (gastroesophageal reflux disease)    Headache    Hyperlipidemia    Hypertension    Stroke [facility])    Vertigo     Encounter Details:  Community Questionnaire - 04/15/24 1936       Questionnaire   Ask client: Do you give verbal consent for me to treat you today? Yes    Student Assistance N/A    Location Patient Served  [place]    Encounter Setting CN site    Population Status Unknown    Insurance Medicaid    Insurance/Financial Assistance Referral N/A    Medication N/A    Medical Provider Yes    Screening Referrals Made N/A    Medical Referrals Made N/A    Medical Appointment Completed N/A    CNP Interventions Advocate/Support    Screenings CN Performed Blood Pressure;Blood Glucose    ED Visit Averted N/A    Life-Saving Intervention Made N/A          Today's Vitals   04/15/24 1935  BP: 129/87  Pulse: 96   There is no height or weight on file to calculate BMI.

## 2024-04-24 ENCOUNTER — Observation Stay

## 2024-04-24 ENCOUNTER — Encounter: Payer: Self-pay | Admitting: Internal Medicine

## 2024-04-24 ENCOUNTER — Emergency Department

## 2024-04-24 ENCOUNTER — Observation Stay
Admission: EM | Admit: 2024-04-24 | Discharge: 2024-04-26 | Disposition: A | Attending: Family Medicine | Admitting: Family Medicine

## 2024-04-24 ENCOUNTER — Other Ambulatory Visit: Payer: Self-pay

## 2024-04-24 DIAGNOSIS — Z87891 Personal history of nicotine dependence: Secondary | ICD-10-CM | POA: Insufficient documentation

## 2024-04-24 DIAGNOSIS — R2990 Unspecified symptoms and signs involving the nervous system: Secondary | ICD-10-CM | POA: Diagnosis present

## 2024-04-24 DIAGNOSIS — K219 Gastro-esophageal reflux disease without esophagitis: Secondary | ICD-10-CM | POA: Diagnosis present

## 2024-04-24 DIAGNOSIS — F40240 Claustrophobia: Secondary | ICD-10-CM

## 2024-04-24 DIAGNOSIS — R200 Anesthesia of skin: Secondary | ICD-10-CM | POA: Diagnosis not present

## 2024-04-24 DIAGNOSIS — Z8673 Personal history of transient ischemic attack (TIA), and cerebral infarction without residual deficits: Secondary | ICD-10-CM | POA: Diagnosis not present

## 2024-04-24 DIAGNOSIS — E785 Hyperlipidemia, unspecified: Secondary | ICD-10-CM | POA: Diagnosis not present

## 2024-04-24 DIAGNOSIS — J449 Chronic obstructive pulmonary disease, unspecified: Secondary | ICD-10-CM | POA: Diagnosis not present

## 2024-04-24 DIAGNOSIS — Z6838 Body mass index (BMI) 38.0-38.9, adult: Secondary | ICD-10-CM | POA: Insufficient documentation

## 2024-04-24 DIAGNOSIS — I10 Essential (primary) hypertension: Secondary | ICD-10-CM | POA: Diagnosis present

## 2024-04-24 DIAGNOSIS — Z7982 Long term (current) use of aspirin: Secondary | ICD-10-CM | POA: Diagnosis not present

## 2024-04-24 DIAGNOSIS — Z79899 Other long term (current) drug therapy: Secondary | ICD-10-CM | POA: Diagnosis not present

## 2024-04-24 DIAGNOSIS — R202 Paresthesia of skin: Principal | ICD-10-CM | POA: Insufficient documentation

## 2024-04-24 LAB — COMPREHENSIVE METABOLIC PANEL WITH GFR
ALT: 19 U/L (ref 0–44)
AST: 20 U/L (ref 15–41)
Albumin: 3.9 g/dL (ref 3.5–5.0)
Alkaline Phosphatase: 46 U/L (ref 38–126)
Anion gap: 13 (ref 5–15)
BUN: 18 mg/dL (ref 6–20)
CO2: 26 mmol/L (ref 22–32)
Calcium: 8.9 mg/dL (ref 8.9–10.3)
Chloride: 99 mmol/L (ref 98–111)
Creatinine, Ser: 0.59 mg/dL (ref 0.44–1.00)
GFR, Estimated: >60 mL/min (ref >60)
Glucose, Bld: 108 mg/dL — ABNORMAL HIGH (ref 70–99)
Potassium: 3.6 mmol/L (ref 3.5–5.1)
Sodium: 138 mmol/L (ref 135–145)
Total Bilirubin: 0.5 mg/dL (ref 0.0–1.2)
Total Protein: 7.9 g/dL (ref 6.5–8.1)

## 2024-04-24 LAB — CBC
HCT: 37.3 % (ref 36.0–46.0)
Hemoglobin: 11.5 g/dL — ABNORMAL LOW (ref 12.0–15.0)
MCH: 27.4 pg (ref 26.0–34.0)
MCHC: 30.8 g/dL (ref 30.0–36.0)
MCV: 89 fL (ref 80.0–100.0)
Platelets: 291 K/uL (ref 150–400)
RBC: 4.19 MIL/uL (ref 3.87–5.11)
RDW: 14.3 % (ref 11.5–15.5)
WBC: 5.3 K/uL (ref 4.0–10.5)
nRBC: 0 % (ref 0.0–0.2)

## 2024-04-24 LAB — ETHANOL: Alcohol, Ethyl (B): <15 mg/dL (ref <15)

## 2024-04-24 LAB — DIFFERENTIAL
Abs Immature Granulocytes: 0.03 K/uL (ref 0.00–0.07)
Basophils Absolute: 0.1 K/uL (ref 0.0–0.1)
Basophils Relative: 1 %
Eosinophils Absolute: 0.2 K/uL (ref 0.0–0.5)
Eosinophils Relative: 3 %
Immature Granulocytes: 1 %
Lymphocytes Relative: 48 %
Lymphs Abs: 2.5 K/uL (ref 0.7–4.0)
Monocytes Absolute: 0.5 K/uL (ref 0.1–1.0)
Monocytes Relative: 9 %
Neutro Abs: 2 K/uL (ref 1.7–7.7)
Neutrophils Relative %: 38 %

## 2024-04-24 LAB — TROPONIN I (HIGH SENSITIVITY): Troponin I (High Sensitivity): <2 ng/L (ref <18)

## 2024-04-24 LAB — CBG MONITORING, ED: Glucose-Capillary: 132 mg/dL — ABNORMAL HIGH (ref 70–99)

## 2024-04-24 LAB — PROTIME-INR
INR: 1 (ref 0.8–1.2)
Prothrombin Time: 13.3 s (ref 11.4–15.2)

## 2024-04-24 LAB — APTT: aPTT: 29 s (ref 24–36)

## 2024-04-24 MED ORDER — ASPIRIN 81 MG PO TBEC
81.0000 mg | DELAYED_RELEASE_TABLET | Freq: Every day | ORAL | Status: DC
  Administered 2024-04-25 – 2024-04-26 (×2): 81 mg via ORAL
  Filled 2024-04-24 (×2): qty 1

## 2024-04-24 MED ORDER — BUTALBITAL-APAP-CAFFEINE 50-325-40 MG PO TABS
1.0000 | ORAL_TABLET | Freq: Four times a day (QID) | ORAL | Status: DC | PRN
Start: 2024-04-24 — End: 2024-04-26
  Administered 2024-04-24 – 2024-04-26 (×3): 1 via ORAL
  Filled 2024-04-24 (×3): qty 1

## 2024-04-24 MED ORDER — ACETAMINOPHEN 325 MG PO TABS
650.0000 mg | ORAL_TABLET | ORAL | Status: DC | PRN
  Administered 2024-04-24 – 2024-04-25 (×2): 650 mg via ORAL
  Filled 2024-04-24 (×2): qty 2

## 2024-04-24 MED ORDER — ACETAMINOPHEN 160 MG/5ML PO SOLN: 650.0000 mg | ORAL | Status: DC | PRN

## 2024-04-24 MED ORDER — LORAZEPAM 2 MG/ML IJ SOLN
1.0000 mg | INTRAMUSCULAR | Status: AC | PRN
  Administered 2024-04-24: 1 mg via INTRAVENOUS
  Filled 2024-04-24: qty 1

## 2024-04-24 MED ORDER — ACETAMINOPHEN 650 MG RE SUPP: 650.0000 mg | RECTAL | Status: DC | PRN

## 2024-04-24 MED ORDER — SODIUM CHLORIDE 0.9 % IV SOLN: 12.5000 mg | Freq: Four times a day (QID) | INTRAVENOUS | Status: AC | PRN

## 2024-04-24 MED ORDER — ALBUTEROL SULFATE (2.5 MG/3ML) 0.083% IN NEBU: 2.5000 mg | INHALATION_SOLUTION | Freq: Four times a day (QID) | RESPIRATORY_TRACT | Status: DC | PRN

## 2024-04-24 MED ORDER — IOHEXOL 350 MG/ML SOLN
75.0000 mL | Freq: Once | INTRAVENOUS | Status: AC | PRN
  Administered 2024-04-24: 75 mL via INTRAVENOUS

## 2024-04-24 MED ORDER — BENZONATATE 100 MG PO CAPS
100.0000 mg | ORAL_CAPSULE | Freq: Three times a day (TID) | ORAL | Status: DC | PRN
Start: 2024-04-24 — End: 2024-04-26
  Administered 2024-04-25: 100 mg via ORAL
  Filled 2024-04-24: qty 1

## 2024-04-24 MED ORDER — ONDANSETRON HCL 4 MG/2ML IJ SOLN
4.0000 mg | Freq: Four times a day (QID) | INTRAMUSCULAR | Status: DC | PRN
  Administered 2024-04-25: 4 mg via INTRAVENOUS
  Filled 2024-04-24: qty 2

## 2024-04-24 MED ORDER — MELATONIN 5 MG PO TABS: 5.0000 mg | ORAL_TABLET | Freq: Every evening | ORAL | Status: DC | PRN

## 2024-04-24 MED ORDER — ASPIRIN 81 MG PO CHEW
243.0000 mg | CHEWABLE_TABLET | Freq: Once | ORAL | Status: AC
  Administered 2024-04-24: 243 mg via ORAL
  Filled 2024-04-24: qty 3

## 2024-04-24 MED ORDER — ONDANSETRON HCL 4 MG/2ML IJ SOLN
4.0000 mg | Freq: Once | INTRAMUSCULAR | Status: AC
  Administered 2024-04-24: 4 mg via INTRAVENOUS
  Filled 2024-04-24: qty 2

## 2024-04-24 MED ORDER — SENNOSIDES-DOCUSATE SODIUM 8.6-50 MG PO TABS: 1.0000 | ORAL_TABLET | Freq: Every evening | ORAL | Status: DC | PRN

## 2024-04-24 MED ORDER — STROKE: EARLY STAGES OF RECOVERY BOOK: Freq: Once | Status: AC

## 2024-04-24 MED ORDER — SODIUM CHLORIDE 0.9% FLUSH: 3.0000 mL | Freq: Once | INTRAVENOUS | Status: DC

## 2024-04-24 MED ORDER — HYDRALAZINE HCL 20 MG/ML IJ SOLN: 5.0000 mg | INTRAMUSCULAR | Status: AC | PRN

## 2024-04-24 NOTE — ED Provider Notes (Addendum)
 [facility] Provider Note    Event Date/Time   First MD Initiated Contact with Patient 04/24/24 1035     (approximate)   History   Code Stroke   HPI  Jocelyn Simon is a 58 y.o. female past medical history significant for hypertension, hyperlipidemia, COPD, who presents to the emergency department with left arm numbness.  Last known well is at 6 or 630 this morning.  Woke up and then after being awake noticed that her left arm went entirely numb and has been numb since that time.  Went to the store and got some aspirin .  No falls or trauma.  Denies any chest pain or shortness of breath.  Felt like the back of her head was feeling numb.  Not on anticoagulation.  Denies any weakness in her arms or legs.  No trouble with her speech or confusion.     Physical Exam   Triage Vital Signs: ED Triage Vitals [04/24/24 1026]  Encounter Vitals Group     BP (!) 159/88     Girls Systolic BP Percentile      Girls Diastolic BP Percentile      Boys Systolic BP Percentile      Boys Diastolic BP Percentile      Pulse Rate 79     Resp 20     Temp 98 F (36.7 C)     Temp Source Oral     SpO2 98 %     Weight      Height      Head Circumference      Peak Flow      Pain Score      Pain Loc      Pain Education      Exclude from Growth Chart     Most recent vital signs: Vitals:   04/24/24 1026  BP: (!) 159/88  Pulse: 79  Resp: 20  Temp: 98 F (36.7 C)  SpO2: 98%    Physical Exam Constitutional:      Appearance: She is well-developed.  HENT:     Head: Atraumatic.  Eyes:     Conjunctiva/sclera: Conjunctivae normal.  Cardiovascular:     Rate and Rhythm: Regular rhythm.  Pulmonary:     Effort: No respiratory distress.  Abdominal:     General: There is no distension.  Musculoskeletal:        General: Normal range of motion.     Cervical back: Normal range of motion.  Skin:    General: Skin is warm.  Neurological:     Mental Status: She is  alert.     IMPRESSION / MDM / ASSESSMENT AND PLAN / ED COURSE  I reviewed the triage vital signs and the nursing notes.  On arrival patient with left arm numbness and paresthesias.  Last known well was at 6:00.  Within the window for TNK.  Activated code stroke.  Patient immediately brought back to CT scanner for evaluation.  Stroke neurology with Dr. Germaine at bedside  My evaluation no findings of acute intracranial hemorrhage on CT scan of the head   EKG  I, Clotilda Punter, the attending physician, personally viewed and interpreted this ECG.  EKG showed normal sinus rhythm.  Normal intervals.  No chamber enlargement.  No significant ST elevation or depression.  No findings of acute ischemia or dysrhythmia.  No tachycardic or bradycardic dysrhythmias while on cardiac telemetry.  RADIOLOGY I independently reviewed imaging, my interpretation of imaging: CT scan of  the head -no signs of intracranial hemorrhage  CTA head and neck with no signs of acute intracranial hemorrhage or large vessel occlusion.  LABS (all labs ordered are listed, but only abnormal results are displayed) Labs interpreted as -    Labs Reviewed  CBC - Abnormal; Notable for the following components:      Result Value   Hemoglobin 11.5 (*)    All other components within normal limits  COMPREHENSIVE METABOLIC PANEL WITH GFR - Abnormal; Notable for the following components:   Glucose, Bld 108 (*)    All other components within normal limits  CBG MONITORING, ED - Abnormal; Notable for the following components:   Glucose-Capillary 132 (*)    All other components within normal limits  DIFFERENTIAL  ETHANOL  PROTIME-INR  APTT  HIV ANTIBODY (ROUTINE TESTING W REFLEX)  URINE DRUG SCREEN, QUALITATIVE ([facility] ONLY)  TROPONIN I (HIGH SENSITIVITY)     MDM  Patient presents to the emergency department as an activated code stroke for new onset left hemibody numbness.  Initially left arm numbness but has  progressed to the left face and leg.  Immediately evaluated by stroke neurology.  Not a TNK candidate.  CTA with no signs of LVO.  Neurology recommended admission for MRI and further CVA workup.  Lab work overall reassuring with no significant electrolyte abnormalities.  Consulted hospitalist for admission for stroke workup     PROCEDURES:  Critical Care performed: yes  .Critical Care  Performed by: Suzanne Kirsch, MD Authorized by: Suzanne Kirsch, MD   Critical care provider statement:    Critical care time (minutes):  30   Critical care time was exclusive of:  Separately billable procedures and treating other patients   Critical care was necessary to treat or prevent imminent or life-threatening deterioration of the following conditions:  CNS failure or compromise   Critical care was time spent personally by me on the following activities:  Development of treatment plan with patient or surrogate, discussions with consultants, evaluation of patient's response to treatment, examination of patient, ordering and review of laboratory studies, ordering and review of radiographic studies, ordering and performing treatments and interventions, pulse oximetry, re-evaluation of patient's condition and review of old charts   Care discussed with: admitting provider     Patient's presentation is most consistent with acute presentation with potential threat to life or bodily function.   MEDICATIONS ORDERED IN ED: Medications  sodium chloride  flush (NS) 0.9 % injection 3 mL ( Intravenous Canceled Entry 04/24/24 1027)   stroke: early stages of recovery book (has no administration in time range)  acetaminophen  (TYLENOL ) tablet 650 mg (has no administration in time range)    Or  acetaminophen  (TYLENOL ) 160 MG/5ML solution 650 mg (has no administration in time range)    Or  acetaminophen  (TYLENOL ) suppository 650 mg (has no administration in time range)  senna-docusate (Senokot-S) tablet 1 tablet  (has no administration in time range)  iohexol  (OMNIPAQUE ) 350 MG/ML injection 75 mL (75 mLs Intravenous Contrast Given 04/24/24 1055)  ondansetron  (ZOFRAN ) injection 4 mg (4 mg Intravenous Given 04/24/24 1229)    FINAL CLINICAL IMPRESSION(S) / ED DIAGNOSES   Final diagnoses:  Left sided numbness     Rx / DC Orders   ED Discharge Orders     None        Note:  This document was prepared using Dragon voice recognition software and may include unintentional dictation errors.   Suzanne Kirsch, MD 04/24/24 [phone]  Suzanne Kirsch, MD 04/24/24 1240

## 2024-04-24 NOTE — H&P (Addendum)
 History and Physical   [name] FMW:[id number] DOB: 12-07-1965 DOA: 04/24/2024  PCP: [facility]  Patient coming from: Home  I have personally briefly reviewed patient's old medical records in [facility] EMR.  Chief Concern: Left upper extremity numbness  HPI: [name] is a 58 year old female with history of CVA, anxiety, COPD, hyperlipidemia, hypertension, who presents to the ED for chief concerns of left upper extremity numbness.  Vitals in the ED showed t of 98, rr 18, hr 85, blood pressure initially 159/88, currently 140/95, SpO2 98% on room air.  Serum sodium is 138, potassium 2.6, chloride 99, bicarb 26, BUN of 18, serum creatinine 0.59, eGFR greater than 60, nonfasting blood glucose 108, WBC 5.3, hemoglobin 11.5, platelets of 291.  HS troponin is less than 2.  EtOH is less than 15.  ED treatment: Ondansetron  4 mg IV one-time dose. -------------------------------------- At bedside, patient able to tell me her first and last name, age, patient, current calendar year.  She reports persistent left upper extremity numbness and tingling.  She reports she has never felt this way before.  She reports that the symptoms started around 730 this morning.  She reports she went to bed fine.  She reports that symptoms continued to breakfast and so she asked her husband to drive her to [facility] for aspirin .  She reports she took 81 mg, baby aspirin  prior to ED presentation.  She denies chest pain, new shortness of breath, abdominal pain, dysuria, vomiting, hematuria, dysuria, swelling of her lower extremity, syncope, loss of conscious.  She endorses nausea.  Denies fever, chills, cough.  Social history: She lives at home with her husband.  She denies tobacco, EtOH, recreational drug use.  She is retired.  ROS: Constitutional: no weight change, no fever ENT/Mouth: no sore throat, no rhinorrhea Eyes: no eye pain, no vision changes Cardiovascular: no chest  pain, no dyspnea,  no edema, no palpitations Respiratory: no cough, no sputum, no wheezing Gastrointestinal: + nausea, no vomiting, no diarrhea, no constipation Genitourinary: no urinary incontinence, no dysuria, no hematuria Musculoskeletal: no arthralgias, no myalgias Skin: no skin lesions, no pruritus, Neuro: + weakness, no loss of consciousness, no syncope, + LU Ext numbness and tingling Psych: no anxiety, no depression, no decrease appetite Heme/Lymph: no bruising, no bleeding  ED Course: Discussed with EDP, patient requiring hospitalization for chief concerns of strokelike symptoms.  Assessment/Plan  Principal Problem:   Stroke-like symptom Active Problems:   Essential hypertension   Hyperlipidemia   Chronic obstructive pulmonary disease (HCC)   Gastroesophageal reflux disease without esophagitis   Claustrophobia   Morbid obesity (HCC)   Assessment and Plan:  * Stroke-like symptom Neurology has been consulted and we appreciate further recommendations Complete echo not ordered pending MRI. MRI of the brain Fasting lipid and A1c ordered Permissive hypertension per neurology: Hydralazine 5 mg IV every 4 hours as needed for SBP > 200 Frequent neuro vascular checks N.p.o. pending swallow study PT, OT Fall precaution  Chronic obstructive pulmonary disease (HCC) Not in exacerbation Home albuterol , 2 puff inhalation every 6 hours as needed for wheezing and shortness of breath resumed  Hyperlipidemia Check lipid panel in the a.m.  Essential hypertension No scheduled home antihypertensive medication at this time Permissive hypertension with no treatment of SBP less than 200  Morbid obesity (HCC) This meets criteria for morbid obesity based on the presence of 1 or more chronic comorbidities. Patient has BMI of 38.2 and hypertension. This complicates overall care and prognosis.  Claustrophobia Ativan  1 mg IV as needed for 15 minutes before MRI, 1 dose ordered  Chart  reviewed.   DVT prophylaxis: Pharmacologic DVT not initiated on admission.  AM team to initiate pharmacologic DVT when the benefits outweigh the risk.  Code Status: Full code Diet: NPO pending nursing swallow screen Family Communication: Updated spouse at bedside Disposition Plan: Pending clinical course Consults called: Neurology Admission status: Telemetry, observation  Past Medical History:  Diagnosis Date   Anxiety    Asthma    COPD (chronic obstructive pulmonary disease) (HCC)    Dyspnea    with exertion   GERD (gastroesophageal reflux disease)    Headache    Hyperlipidemia    Hypertension    Stroke [facility])    Vertigo    Past Surgical History:  Procedure Laterality Date   BREAST BIOPSY Left 12/10/2016   papilloma   BREAST EXCISIONAL BIOPSY Left 01/07/2017   excision of papilloma   BREAST LUMPECTOMY WITH NEEDLE LOCALIZATION Left 01/07/2017   Procedure: BREAST LUMPECTOMY WITH NEEDLE LOCALIZATION;  Surgeon: Dellie Louanne MATSU, MD;  Location: [facility] ORS;  Service: General;  Laterality: Left;   LAPAROSCOPIC SALPINGO OOPHERECTOMY Bilateral 12/20/2015   Procedure: LAPAROSCOPIC BILATERAL SALPINGO OOPHORECTOMY WITH PELVIC WASHINGS;  Surgeon: Heather Penton, MD;  Location: [facility] ORS;  Service: Gynecology;  Laterality: Bilateral;   TUBAL LIGATION     Social History:  reports that she has quit smoking. Her smoking use included cigarettes. She has never used smokeless tobacco. She reports that she does not drink alcohol and does not use drugs.  Allergies  Allergen Reactions   Gadolinium Derivatives Hives    Ativan  for anxiolysis also given prior to MRI; both listed as allergies since it was unclear which agent caused a reaction   Lorazepam  Hives    Given prior to MRI (also given multihance gadolinium); both listed as allergies since it was unclear which agent caused a reaction   Family History  Problem Relation Age of Onset   CAD Mother    CAD Sister    CVA Other    Breast  cancer Neg Hx    Family history: Family history reviewed and not pertinent.  Prior to Admission medications   Medication Sig Start Date End Date Taking? Authorizing Provider  albuterol  (VENTOLIN  HFA) 108 (90 Base) MCG/ACT inhaler Inhale 2 puffs into the lungs every 6 (six) hours as needed for wheezing or shortness of breath. 11/06/23   Saunders Shona CROME, PA-C  amLODipine  (NORVASC ) 5 MG tablet Take 1 tablet (5 mg total) by mouth daily. Patient taking differently: Take 5 mg by mouth at bedtime.  08/15/16   Doles-Johnson, Teah, NP  aspirin  EC 81 MG tablet Take 1 tablet (81 mg total) by mouth daily. 02/15/16   Glover Lenis, MD  azithromycin  (ZITHROMAX  Z-PAK) 250 MG tablet Take 2 tablets (500 mg) on  Day 1,  followed by 1 tablet (250 mg) once daily on Days 2 through 5. 11/06/23   Saunders Shona CROME, PA-C  benzonatate  (TESSALON  PERLES) 100 MG capsule Take 1 capsule (100 mg total) by mouth 3 (three) times daily as needed for cough. 11/06/23 11/05/24  Saunders Shona CROME, PA-C  clopidogrel  (PLAVIX ) 75 MG tablet Take 1 tablet (75 mg total) by mouth daily. 04/01/16   Sainani, Vivek J, MD  dicyclomine  (BENTYL ) 10 MG capsule Take 1 capsule (10 mg total) by mouth 4 (four) times daily for 14 days. 10/04/19 10/18/19  Arlander Charleston, MD  estradiol (ESTRACE) 1 MG tablet Take 1 mg  by mouth daily.    [provider]  fluticasone  (FLONASE ) 50 MCG/ACT nasal spray Place 2 sprays into both nostrils daily. 11/30/18 11/30/19  Saunders Shona CROME, PA-C  hydrochlorothiazide  (HYDRODIURIL ) 25 MG tablet Take 1 tablet (25 mg total) by mouth daily. 07/18/16   Doles-Johnson, Teah, NP  ondansetron  (ZOFRAN -ODT) 4 MG disintegrating tablet Take 1 tablet (4 mg total) by mouth every 8 (eight) hours as needed for nausea or vomiting. 11/06/23   Saunders Shona CROME, PA-C   Physical Exam: Vitals:   04/24/24 1230 04/24/24 1300 04/24/24 1332 04/24/24 1403  BP: (!) 140/95 (!) 110/98 129/81   Pulse: 71 72 70   Resp: 20 16 16    Temp:   98.2 F  (36.8 C)   TempSrc:      SpO2: 100% 100% 97%   Weight:    91.7 kg  Height:    5' 1 (1.549 m)   Constitutional: appears age-appropriate, NAD, calm Eyes: PERRL, lids and conjunctivae normal ENMT: Mucous membranes are moist. Posterior pharynx clear of any exudate or lesions. Age-appropriate dentition. Hearing appropriate Neck: normal, supple, no masses, no thyromegaly Respiratory: clear to auscultation bilaterally, no wheezing, no crackles. Normal respiratory effort. No accessory muscle use.  Cardiovascular: Regular rate and rhythm, no murmurs / rubs / gallops. No extremity edema. 2+ pedal pulses. No carotid bruits.  Abdomen: Truncal obesity, no tenderness, no masses palpated, no hepatosplenomegaly. Bowel sounds positive.  Musculoskeletal: no clubbing / cyanosis. No joint deformity upper and lower extremities. Good ROM, no contractures, no atrophy. Normal muscle tone.  Skin: no rashes, lesions, ulcers. No induration Neurologic: Sensation intact. Strength 5/5 in all 4.  Psychiatric: Normal judgment and insight. Alert and oriented x 3. Normal mood.   EKG: independently reviewed, showing sinus rhythm 79, qtc 457  Chest x-ray on Admission: Not indicated at this time  CT ANGIO HEAD NECK W WO CM Result Date: 04/24/2024 EXAM: CTA HEAD AND NECK WITHOUT AND WITH 04/24/2024 11:06:06 AM TECHNIQUE: CTA of the head and neck was performed without and with the administration of intravenous contrast. Multiplanar 2D and/or 3D reformatted images are provided for review. Automated exposure control, iterative reconstruction, and/or weight based adjustment of the mA/kV was utilized to reduce the radiation dose to as low as reasonably achievable. Stenosis of the internal carotid arteries measured using NASCET criteria. COMPARISON: None available CLINICAL HISTORY: Neuro deficit, acute, stroke suspected FINDINGS: AORTIC ARCH AND ARCH VESSELS: No dissection or arterial injury. No significant stenosis of the  brachiocephalic or subclavian arteries. CERVICAL CAROTID ARTERIES: No dissection, arterial injury, or hemodynamically significant stenosis by NASCET criteria. CERVICAL VERTEBRAL ARTERIES: No dissection, arterial injury, or significant stenosis. LUNGS AND MEDIASTINUM: Unremarkable. SOFT TISSUES: No acute abnormality. BONES: No acute abnormality. ANTERIOR CIRCULATION: No significant stenosis of the internal carotid arteries. No significant stenosis of the anterior cerebral arteries. No significant stenosis of the middle cerebral arteries. No aneurysm. POSTERIOR CIRCULATION: No significant stenosis of the posterior cerebral arteries. No significant stenosis of the basilar artery. No significant stenosis of the vertebral arteries. No aneurysm. OTHER: No dural venous sinus thrombosis on this non-dedicated study. IMPRESSION: 1. No large vessel occlusion, hemodynamically significant stenosis, or aneurysm in the head or neck. Electronically signed by: Franky Stanford MD 04/24/2024 11:45 AM EDT RP Workstation: [id number]   CT HEAD CODE STROKE WO CONTRAST Result Date: 04/24/2024 EXAM: CT HEAD WITHOUT 04/24/2024 10:38:18 AM TECHNIQUE: CT of the head was performed without the administration of intravenous contrast. Automated exposure control, iterative reconstruction, and/or weight based  adjustment of the mA/kV was utilized to reduce the radiation dose to as low as reasonably achievable. COMPARISON: None available. CLINICAL HISTORY: Neuro deficit, acute, stroke suspected FINDINGS: BRAIN AND VENTRICLES: No acute intracranial hemorrhage. No mass effect or midline shift. No extra-axial fluid collection. No evidence of acute infarct. No hydrocephalus. Partially empty sella. ORBITS: No acute abnormality. SINUSES AND MASTOIDS: No acute abnormality. SOFT TISSUES AND SKULL: No acute skull fracture. No acute soft tissue abnormality. [place] Stroke Program Early CT Score (ASPECTS) Ganglionic (caudate, internal capsule, lentiform nucleus,  insula, M1-M3): 7 Supraganglionic (M4-M6): 3 Total: 10 The above findings were communicated to Doctor Reynolds at 10:44 AM 04/24/2024. IMPRESSION: 1. No acute intracranial abnormality related to the suspected stroke. 2. ASPECTS: 10. Electronically signed by: Evalene Coho MD 04/24/2024 10:45 AM EDT RP Workstation: [id number]   Labs on Admission: I have personally reviewed following labs  CBC: Recent Labs  Lab 04/24/24 1130  WBC 5.3  NEUTROABS 2.0  HGB 11.5*  HCT 37.3  MCV 89.0  PLT 291   Basic Metabolic Panel: Recent Labs  Lab 04/24/24 1130  NA 138  K 3.6  CL 99  CO2 26  GLUCOSE 108*  BUN 18  CREATININE 0.59  CALCIUM  8.9   GFR: Estimated Creatinine Clearance: 79.1 mL/min (by C-G formula based on SCr of 0.59 mg/dL).  Liver Function Tests: Recent Labs  Lab 04/24/24 1130  AST 20  ALT 19  ALKPHOS 46  BILITOT 0.5  PROT 7.9  ALBUMIN 3.9   CBG: Recent Labs  Lab 04/24/24 1025  GLUCAP 132*   Urine analysis:    Component Value Date/Time   COLORURINE YELLOW (A) 03/07/2024 1309   APPEARANCEUR CLEAR (A) 03/07/2024 1309   APPEARANCEUR Clear 09/03/2013 2245   LABSPEC 1.020 03/07/2024 1309   LABSPEC 1.019 09/03/2013 2245   PHURINE 6.0 03/07/2024 1309   GLUCOSEU NEGATIVE 03/07/2024 1309   GLUCOSEU Negative 09/03/2013 2245   HGBUR NEGATIVE 03/07/2024 1309   BILIRUBINUR NEGATIVE 03/07/2024 1309   BILIRUBINUR Negative 09/03/2013 2245   KETONESUR NEGATIVE 03/07/2024 1309   PROTEINUR NEGATIVE 03/07/2024 1309   NITRITE NEGATIVE 03/07/2024 1309   LEUKOCYTESUR NEGATIVE 03/07/2024 1309   LEUKOCYTESUR Negative 09/03/2013 2245   This document was prepared using Dragon Voice Recognition software and may include unintentional dictation errors.  Dr. Sherre [facility]  If 7PM-7AM, please contact overnight-coverage provider If 7AM-7PM, please contact day attending provider [email or web address]  04/24/2024, 2:33 PM

## 2024-04-24 NOTE — Consult Note (Addendum)
 Requesting Physician: Mumma    Chief Complaint: Left arm numbness  HPI: Jocelyn Simon is an 58 y.o. female with a history of CVA, HTN, HLD, COPD and anxiety who reports that she awakened today at baseline.  After getting ready for work noted numbness in her LUE.  Symptoms did not resolve therefore patient presented for evaluation.   Patient on ASA at home  LSN: [id number] on 04/24/24 TNK Given: No: Outside time window, nondisabling symptoms Thrombectomy Candidate: No, nondisabling symptoms, no target lesion identified mRS: 0 MD arrival: 1035  Past Medical History:  Diagnosis Date   Anxiety    Asthma    COPD (chronic obstructive pulmonary disease) (HCC)    Dyspnea    with exertion   GERD (gastroesophageal reflux disease)    Headache    Hyperlipidemia    Hypertension    Stroke [facility])    Vertigo     Past Surgical History:  Procedure Laterality Date   BREAST BIOPSY Left 12/10/2016   papilloma   BREAST EXCISIONAL BIOPSY Left 01/07/2017   excision of papilloma   BREAST LUMPECTOMY WITH NEEDLE LOCALIZATION Left 01/07/2017   Procedure: BREAST LUMPECTOMY WITH NEEDLE LOCALIZATION;  Surgeon: Dellie Louanne MATSU, MD;  Location: [facility] ORS;  Service: General;  Laterality: Left;   LAPAROSCOPIC SALPINGO OOPHERECTOMY Bilateral 12/20/2015   Procedure: LAPAROSCOPIC BILATERAL SALPINGO OOPHORECTOMY WITH PELVIC WASHINGS;  Surgeon: Heather Penton, MD;  Location: [facility] ORS;  Service: Gynecology;  Laterality: Bilateral;   TUBAL LIGATION      Family History  Problem Relation Age of Onset   CAD Mother    CAD Sister    CVA Other    Breast cancer Neg Hx    Social History:  reports that she has quit smoking. Her smoking use included cigarettes. She has never used smokeless tobacco. She reports that she does not drink alcohol and does not use drugs.  Allergies: No Known Allergies  Medications:  Prior to Admission medications   Medication Sig Start Date End Date Taking? Authorizing Provider   albuterol  (VENTOLIN  HFA) 108 (90 Base) MCG/ACT inhaler Inhale 2 puffs into the lungs every 6 (six) hours as needed for wheezing or shortness of breath. 11/06/23   Saunders Shona CROME, PA-C  amLODipine  (NORVASC ) 5 MG tablet Take 1 tablet (5 mg total) by mouth daily. Patient taking differently: Take 5 mg by mouth at bedtime.  08/15/16   Doles-Johnson, Teah, NP  aspirin  EC 81 MG tablet Take 1 tablet (81 mg total) by mouth daily. 02/15/16   Glover Lenis, MD  azithromycin  (ZITHROMAX  Z-PAK) 250 MG tablet Take 2 tablets (500 mg) on  Day 1,  followed by 1 tablet (250 mg) once daily on Days 2 through 5. 11/06/23   Saunders Shona CROME, PA-C  benzonatate  (TESSALON  PERLES) 100 MG capsule Take 1 capsule (100 mg total) by mouth 3 (three) times daily as needed for cough. 11/06/23 11/05/24  Saunders Shona CROME, PA-C  clopidogrel  (PLAVIX ) 75 MG tablet Take 1 tablet (75 mg total) by mouth daily. 04/01/16   Sainani, Vivek J, MD  dicyclomine  (BENTYL ) 10 MG capsule Take 1 capsule (10 mg total) by mouth 4 (four) times daily for 14 days. 10/04/19 10/18/19  Arlander Charleston, MD  estradiol (ESTRACE) 1 MG tablet Take 1 mg by mouth daily.    [provider]  fluticasone  (FLONASE ) 50 MCG/ACT nasal spray Place 2 sprays into both nostrils daily. 11/30/18 11/30/19  Saunders Shona CROME, PA-C  hydrochlorothiazide  (HYDRODIURIL ) 25 MG tablet Take 1 tablet (25  mg total) by mouth daily. 07/18/16   Doles-Johnson, Teah, NP  ondansetron  (ZOFRAN -ODT) 4 MG disintegrating tablet Take 1 tablet (4 mg total) by mouth every 8 (eight) hours as needed for nausea or vomiting. 11/06/23   Saunders Givens L, PA-C     ROS: History obtained from the patient  General ROS: negative for - chills, fatigue, fever, night sweats, weight gain or weight loss Psychological ROS: negative for - behavioral disorder, hallucinations, memory difficulties, mood swings or suicidal ideation Ophthalmic ROS: negative for - blurry vision, double vision, eye pain or loss of  vision ENT ROS: negative for - epistaxis, nasal discharge, oral lesions, sore throat, tinnitus or vertigo Allergy and Immunology ROS: negative for - hives or itchy/watery eyes Hematological and Lymphatic ROS: negative for - bleeding problems, bruising or swollen lymph nodes Endocrine ROS: negative for - galactorrhea, hair pattern changes, polydipsia/polyuria or temperature intolerance Respiratory ROS: negative for - cough, hemoptysis, shortness of breath or wheezing Cardiovascular ROS: negative for - chest pain, dyspnea on exertion, edema or irregular heartbeat Gastrointestinal ROS: negative for - abdominal pain, diarrhea, hematemesis, nausea/vomiting or stool incontinence Genito-Urinary ROS: negative for - dysuria, hematuria, incontinence or urinary frequency/urgency Musculoskeletal ROS: hand pain Neurological ROS: as noted in HPI Dermatological ROS: negative for rash and skin lesion changes   Physical Examination: Blood pressure (!) 159/88, pulse 79, temperature 98 F (36.7 C), temperature source Oral, resp. rate 20, SpO2 98%.  Neurologic Examination: NIHSS components Score: Comment  1a Level of Conscious 0[x]  1[]  2[]  3[]      1b LOC Questions 0[x]  1[]  2[]       1c LOC Commands 0[x]  1[]  2[]       2 Best Gaze 0[x]  1[]  2[]       3 Visual 0[x]  1[]  2[]  3[]      4 Facial Palsy 0[x]  1[]  2[]  3[]      5a Motor Arm - left 0[]  1[x]  2[]  3[]  4[]  UN[]    5b Motor Arm - Right 0[x]  1[]  2[]  3[]  4[]  UN[]    6a Motor Leg - Left 0[x]  1[]  2[]  3[]  4[]  UN[]    6b Motor Leg - Right 0[x]  1[]  2[]  3[]  4[]  UN[]    7 Limb Ataxia 0[x]  1[]  2[]  3[]  UN[]     8 Sensory 0[]  1[x]  2[]  UN[]      9 Best Language 0[x]  1[]  2[]  3[]      10 Dysarthria 0[x]  1[]  2[]  UN[]      11 Extinct. and Inattention 0[x]  1[]  2[]       TOTAL: 2      Addendum: with UE testing both arms unable to resist downward pressure and fall to lap  Results for orders placed or performed during the hospital encounter of 04/24/24 (from the past 48 hours)  CBG  monitoring, ED     Status: Abnormal   Collection Time: 04/24/24 10:25 AM  Result Value Ref Range   Glucose-Capillary 132 (H) 70 - 99 mg/dL    Comment: Glucose reference range applies only to samples taken after fasting for at least 8 hours.   CT HEAD CODE STROKE WO CONTRAST Result Date: 04/24/2024 EXAM: CT HEAD WITHOUT 04/24/2024 10:38:18 AM TECHNIQUE: CT of the head was performed without the administration of intravenous contrast. Automated exposure control, iterative reconstruction, and/or weight based adjustment of the mA/kV was utilized to reduce the radiation dose to as low as reasonably achievable. COMPARISON: None available. CLINICAL HISTORY: Neuro deficit, acute, stroke suspected FINDINGS: BRAIN AND VENTRICLES: No acute intracranial hemorrhage. No mass effect or midline shift. No extra-axial fluid collection.  No evidence of acute infarct. No hydrocephalus. Partially empty sella. ORBITS: No acute abnormality. SINUSES AND MASTOIDS: No acute abnormality. SOFT TISSUES AND SKULL: No acute skull fracture. No acute soft tissue abnormality. [place] Stroke Program Early CT Score (ASPECTS) Ganglionic (caudate, internal capsule, lentiform nucleus, insula, M1-M3): 7 Supraganglionic (M4-M6): 3 Total: 10 The above findings were communicated to Doctor Mays Paino at 10:44 AM 04/24/2024. IMPRESSION: 1. No acute intracranial abnormality related to the suspected stroke. 2. ASPECTS: 10. Electronically signed by: Evalene Coho MD 04/24/2024 10:45 AM EDT RP Workstation: [id number]   CTA HEAD AND NECK WITHOUT AND WITH 04/24/2024 11:06:06 AM   TECHNIQUE: CTA of the head and neck was performed without and with the administration of intravenous contrast. Multiplanar 2D and/or 3D reformatted images are provided for review. Automated exposure control, iterative reconstruction, and/or weight based adjustment of the mA/kV was utilized to reduce the radiation dose to as low as reasonably achievable. Stenosis of the  internal carotid arteries measured using NASCET criteria.   COMPARISON: None available   CLINICAL HISTORY: Neuro deficit, acute, stroke suspected   FINDINGS:   AORTIC ARCH AND ARCH VESSELS: No dissection or arterial injury. No significant stenosis of the brachiocephalic or subclavian arteries.   CERVICAL CAROTID ARTERIES: No dissection, arterial injury, or hemodynamically significant stenosis by NASCET criteria.   CERVICAL VERTEBRAL ARTERIES: No dissection, arterial injury, or significant stenosis.   LUNGS AND MEDIASTINUM: Unremarkable.   SOFT TISSUES: No acute abnormality.   BONES: No acute abnormality.   ANTERIOR CIRCULATION: No significant stenosis of the internal carotid arteries. No significant stenosis of the anterior cerebral arteries. No significant stenosis of the middle cerebral arteries. No aneurysm.   POSTERIOR CIRCULATION: No significant stenosis of the posterior cerebral arteries. No significant stenosis of the basilar artery. No significant stenosis of the vertebral arteries. No aneurysm.   OTHER: No dural venous sinus thrombosis on this non-dedicated study.   IMPRESSION: 1. No large vessel occlusion, hemodynamically significant stenosis, or aneurysm in the head or neck.   Assessment: 58 y.o. female with a history of CVA, HTN, HLD, COPD and anxiety who reports that she awakened today at baseline.  After getting ready for work noted numbness in her LUE.  Symptoms did not resolve therefore patient presented for evaluation.   Patient on ASA at home.  From chart review patient has had multiple presentations in the past with left sided weakness.  MRI negative on all of these occasions.  Head CT today personally reviewed and reveals no acute changes. Patient outside time window for thrombolytics.  Although neurological examination not consistent with LVO, CTA performed of the head and neck and personally reviewed.  CTA shows no evidence of LVO.  Stroke  Risk Factors - hyperlipidemia and hypertension  Plan: 1. HgbA1c, fasting lipid panel 2. MRI of the brain without contrast.  If negative and patient remains symptomatic would consider MRI of the cervical spine.   3. PT consult, OT consult, Speech consult 4. Echocardiogram 5. Prophylactic therapy-ASA 325mg  now.  Would continue ASA daily.   7. Permissive blood pressure management with no treatment of SBP<200 8. Telemetry monitoring 9. Frequent neuro checks   Sonny Hock, MD Neurology  04/24/2024, 11:03 AM  Case discussed with Dr. Mumma

## 2024-04-24 NOTE — Assessment & Plan Note (Signed)
 Ativan  1 mg IV as needed for 15 minutes before MRI, 1 dose ordered

## 2024-04-24 NOTE — Progress Notes (Signed)
 CODE STROKE- PHARMACY COMMUNICATION   Time CODE STROKE called/page received: 10:30   Time response to CODE STROKE was made (in person or via phone): 10:40   Time Stroke Kit retrieved from Pyxis (only if needed): Not needed  Name of Provider/Nurse contacted: Dr. Germaine   Past Medical History:  Diagnosis Date   Anxiety    Asthma    COPD (chronic obstructive pulmonary disease) (HCC)    Dyspnea    with exertion   GERD (gastroesophageal reflux disease)    Headache    Hyperlipidemia    Hypertension    Stroke [facility])    Vertigo    Prior to Admission medications   Medication Sig Start Date End Date Taking? Authorizing Provider  albuterol  (VENTOLIN  HFA) 108 (90 Base) MCG/ACT inhaler Inhale 2 puffs into the lungs every 6 (six) hours as needed for wheezing or shortness of breath. 11/06/23   Saunders Shona CROME, PA-C  amLODipine  (NORVASC ) 5 MG tablet Take 1 tablet (5 mg total) by mouth daily. Patient taking differently: Take 5 mg by mouth at bedtime.  08/15/16   Doles-Johnson, Teah, NP  aspirin  EC 81 MG tablet Take 1 tablet (81 mg total) by mouth daily. 02/15/16   Glover Lenis, MD  azithromycin  (ZITHROMAX  Z-PAK) 250 MG tablet Take 2 tablets (500 mg) on  Day 1,  followed by 1 tablet (250 mg) once daily on Days 2 through 5. 11/06/23   Saunders Shona CROME, PA-C  benzonatate  (TESSALON  PERLES) 100 MG capsule Take 1 capsule (100 mg total) by mouth 3 (three) times daily as needed for cough. 11/06/23 11/05/24  Saunders Shona CROME, PA-C  clopidogrel  (PLAVIX ) 75 MG tablet Take 1 tablet (75 mg total) by mouth daily. 04/01/16   Sainani, Vivek J, MD  dicyclomine  (BENTYL ) 10 MG capsule Take 1 capsule (10 mg total) by mouth 4 (four) times daily for 14 days. 10/04/19 10/18/19  Arlander Charleston, MD  estradiol (ESTRACE) 1 MG tablet Take 1 mg by mouth daily.    [provider]  fluticasone  (FLONASE ) 50 MCG/ACT nasal spray Place 2 sprays into both nostrils daily. 11/30/18 11/30/19  Saunders Shona CROME, PA-C   hydrochlorothiazide  (HYDRODIURIL ) 25 MG tablet Take 1 tablet (25 mg total) by mouth daily. 07/18/16   Doles-Johnson, Teah, NP  ondansetron  (ZOFRAN -ODT) 4 MG disintegrating tablet Take 1 tablet (4 mg total) by mouth every 8 (eight) hours as needed for nausea or vomiting. 11/06/23   Saunders Shona CROME, PA-C   Ransom Blanch PGY-1 Pharmacy Resident  [facility]  04/24/2024 10:48 AM

## 2024-04-24 NOTE — Assessment & Plan Note (Addendum)
 Neurology has been consulted and we appreciate further recommendations Complete echo not ordered pending MRI. MRI of the brain Fasting lipid and A1c ordered Permissive hypertension per neurology: Hydralazine 5 mg IV every 4 hours as needed for SBP > 200 Frequent neuro vascular checks N.p.o. pending swallow study PT, OT Fall precaution

## 2024-04-24 NOTE — Assessment & Plan Note (Signed)
Check lipid panel in the a.m.

## 2024-04-24 NOTE — ED Triage Notes (Signed)
 Pt got up at 6am and began having L arm numbness and weakness at 0630 today. Slight drift noted to to L arm. Face symmetrical, speech clear.  CBG 132 BP 159/88

## 2024-04-24 NOTE — ED Notes (Signed)
 Called code stroke called 10:30

## 2024-04-24 NOTE — Assessment & Plan Note (Signed)
 Not in exacerbation Home albuterol , 2 puff inhalation every 6 hours as needed for wheezing and shortness of breath resumed

## 2024-04-24 NOTE — Assessment & Plan Note (Signed)
 This meets criteria for morbid obesity based on the presence of 1 or more chronic comorbidities. Patient has BMI of 38.2 and hypertension. This complicates overall care and prognosis.

## 2024-04-24 NOTE — Progress Notes (Signed)
   04/24/24 1000  Spiritual Encounters  Type of Visit Initial  Care provided to: Pt not available  Referral source Code page  Reason for visit Code  OnCall Visit Yes   Chaplain responded to Code Stroke. Patient at CT. No family present.

## 2024-04-24 NOTE — Hospital Course (Signed)
 [name] is a 58 year old female with history of CVA, anxiety, COPD, hyperlipidemia, hypertension, who presents to the ED for chief concerns of left upper extremity numbness.  Vitals in the ED showed t of 98, rr 18, hr 85, blood pressure initially 159/88, currently 140/95, SpO2 98% on room air.  Serum sodium is 138, potassium 2.6, chloride 99, bicarb 26, BUN of 18, serum creatinine 0.59, eGFR greater than 60, nonfasting blood glucose 108, WBC 5.3, hemoglobin 11.5, platelets of 291.  HS troponin is less than 2.  EtOH is less than 15.  ED treatment: Ondansetron  4 mg IV one-time dose.

## 2024-04-24 NOTE — Assessment & Plan Note (Signed)
 No scheduled home antihypertensive medication at this time Permissive hypertension with no treatment of SBP less than 200

## 2024-04-25 ENCOUNTER — Observation Stay

## 2024-04-25 DIAGNOSIS — R200 Anesthesia of skin: Secondary | ICD-10-CM | POA: Diagnosis not present

## 2024-04-25 DIAGNOSIS — R2990 Unspecified symptoms and signs involving the nervous system: Secondary | ICD-10-CM | POA: Diagnosis not present

## 2024-04-25 DIAGNOSIS — Z7982 Long term (current) use of aspirin: Secondary | ICD-10-CM | POA: Diagnosis not present

## 2024-04-25 DIAGNOSIS — Z8673 Personal history of transient ischemic attack (TIA), and cerebral infarction without residual deficits: Secondary | ICD-10-CM | POA: Diagnosis not present

## 2024-04-25 LAB — HEMOGLOBIN A1C
Hgb A1c MFr Bld: 6 % — ABNORMAL HIGH (ref 4.8–5.6)
Mean Plasma Glucose: 125.5 mg/dL

## 2024-04-25 LAB — LIPID PANEL
Cholesterol: 279 mg/dL — ABNORMAL HIGH (ref 0–200)
HDL: 47 mg/dL (ref >40)
LDL Cholesterol: 195 mg/dL — ABNORMAL HIGH (ref 0–99)
Total CHOL/HDL Ratio: 5.9 ratio
Triglycerides: 184 mg/dL — ABNORMAL HIGH (ref <150)
VLDL: 37 mg/dL (ref 0–40)

## 2024-04-25 LAB — URINE DRUG SCREEN, QUALITATIVE (ARMC ONLY)
Amphetamines, Ur Screen: NOT DETECTED
Barbiturates, Ur Screen: POSITIVE — AB
Benzodiazepine, Ur Scrn: NOT DETECTED
Cannabinoid 50 Ng, Ur ~~LOC~~: NOT DETECTED
Cocaine Metabolite,Ur ~~LOC~~: NOT DETECTED
MDMA (Ecstasy)Ur Screen: NOT DETECTED
Methadone Scn, Ur: NOT DETECTED
Opiate, Ur Screen: NOT DETECTED
Phencyclidine (PCP) Ur S: NOT DETECTED
Tricyclic, Ur Screen: NOT DETECTED

## 2024-04-25 MED ORDER — HYDROCORTISONE (PERIANAL) 2.5 % EX CREA: 1.0000 | TOPICAL_CREAM | Freq: Four times a day (QID) | CUTANEOUS | Status: DC | PRN

## 2024-04-25 MED ORDER — LORAZEPAM 2 MG/ML IJ SOLN
2.0000 mg | Freq: Once | INTRAMUSCULAR | Status: AC | PRN
  Administered 2024-04-25: 2 mg via INTRAVENOUS
  Filled 2024-04-25: qty 1

## 2024-04-25 MED ORDER — MUSCLE RUB 10-15 % EX CREA
1.0000 | TOPICAL_CREAM | CUTANEOUS | Status: DC | PRN
Start: 2024-04-25 — End: 2024-04-26

## 2024-04-25 MED ORDER — AMLODIPINE BESYLATE 5 MG PO TABS
5.0000 mg | ORAL_TABLET | Freq: Every day | ORAL | Status: DC
  Administered 2024-04-25 – 2024-04-26 (×2): 5 mg via ORAL
  Filled 2024-04-25 (×2): qty 1

## 2024-04-25 MED ORDER — DIAZEPAM 5 MG/ML IJ SOLN
2.5000 mg | Freq: Once | INTRAMUSCULAR | Status: AC
  Administered 2024-04-25: 2.5 mg via INTRAVENOUS
  Filled 2024-04-25: qty 2

## 2024-04-25 MED ORDER — CALCIUM CARBONATE ANTACID 500 MG PO CHEW
1.0000 | CHEWABLE_TABLET | Freq: Three times a day (TID) | ORAL | Status: DC | PRN
Start: 2024-04-25 — End: 2024-04-26
  Administered 2024-04-25: 200 mg via ORAL
  Filled 2024-04-25: qty 1

## 2024-04-25 MED ORDER — INFLUENZA VIRUS VACC SPLIT PF (FLUZONE) 0.5 ML IM SUSY: 0.5000 mL | PREFILLED_SYRINGE | INTRAMUSCULAR | Status: DC

## 2024-04-25 MED ORDER — SALINE SPRAY 0.65 % NA SOLN
1.0000 | NASAL | Status: DC | PRN
Start: 2024-04-25 — End: 2024-04-26

## 2024-04-25 MED ORDER — CARMEX CLASSIC LIP BALM EX OINT: 1.0000 | TOPICAL_OINTMENT | CUTANEOUS | Status: DC | PRN

## 2024-04-25 MED ORDER — POLYVINYL ALCOHOL 1.4 % OP SOLN: 1.0000 [drp] | OPHTHALMIC | Status: DC | PRN

## 2024-04-25 MED ORDER — ENOXAPARIN SODIUM 60 MG/0.6ML IJ SOSY: 45.0000 mg | PREFILLED_SYRINGE | INTRAMUSCULAR | Status: DC

## 2024-04-25 MED ORDER — PNEUMOCOCCAL 20-VAL CONJ VACC 0.5 ML IM SUSY: 0.5000 mL | PREFILLED_SYRINGE | INTRAMUSCULAR | Status: DC

## 2024-04-25 MED ORDER — ATORVASTATIN CALCIUM 20 MG PO TABS
80.0000 mg | ORAL_TABLET | Freq: Every day | ORAL | Status: DC
  Administered 2024-04-25 – 2024-04-26 (×2): 80 mg via ORAL
  Filled 2024-04-25 (×2): qty 4

## 2024-04-25 MED ORDER — PHENOL 1.4 % MT LIQD
1.0000 | OROMUCOSAL | Status: DC | PRN
  Administered 2024-04-25: 1 via OROMUCOSAL
  Filled 2024-04-25: qty 177

## 2024-04-25 NOTE — Evaluation (Signed)
 Physical Therapy Evaluation Patient Details Name: Jocelyn Simon MRN: [id number] DOB: [birth year] Today's Date: 04/25/2024  History of Present Illness  Pt is a 58 y/o F admitted 04/24/24 after presenting with LUE numbness. MRI negative for acute issues. PMH: CVA, anxiety, COPD, HLD, HTN  Clinical Impression  Pt seen for PT evaluation with pt agreeable to tx. Pt reports prior to admission she was independent, driving, working. On this date, pt notes numbness/tingling/pins/needles in LUE that is more than earlier. Pt with slight weakness noted in LUE/LLE, decreased balance. Pt is able to ambulate with CGA without AD with gait pattern as noted below. Pt denies worsening symptoms with mobility. MD made aware. Recommend OPPT services upon d/c.         If plan is discharge home, recommend the following: A little help with walking and/or transfers;A little help with bathing/dressing/bathroom;Assistance with cooking/housework;Help with stairs or ramp for entrance;Assist for transportation   Can travel by private vehicle        Equipment Recommendations None recommended by PT  Recommendations for Other Services  Rehab consult    Functional Status Assessment Patient has had a recent decline in their functional status and demonstrates the ability to make significant improvements in function in a reasonable and predictable amount of time.     Precautions / Restrictions Precautions Precautions: Fall Restrictions Weight Bearing Restrictions Per Provider Order: No      Mobility  Bed Mobility Overal bed mobility: Modified Independent Bed Mobility: Supine to Sit, Sit to Supine     Supine to sit: Modified independent (Device/Increase time), HOB elevated Sit to supine: HOB elevated, Modified independent (Device/Increase time)        Transfers Overall transfer level: Needs assistance Equipment used: None Transfers: Sit to/from Stand Sit to Stand: Min assist, Contact guard assist            General transfer comment: min assist on initial sit<>stand 2/2 decreased balance    Ambulation/Gait Ambulation/Gait assistance: Contact guard assist Gait Distance (Feet): 125 Feet Assistive device: None   Gait velocity: decreased     General Gait Details: decreased LUE reciprocal arm swing  Stairs            Wheelchair Mobility     Tilt Bed    Modified Rankin (Stroke Patients Only)       Balance Overall balance assessment: Needs assistance Sitting-balance support: Feet supported Sitting balance-Leahy Scale: Good     Standing balance support: During functional activity, No upper extremity supported Standing balance-Leahy Scale: Fair                               Pertinent Vitals/Pain Pain Assessment Pain Assessment: No/denies pain    Home Living Family/patient expects to be discharged to:: Private residence Living Arrangements: Spouse/significant other Available Help at Discharge: Family Type of Home: House Home Access: Stairs to enter Entrance Stairs-Rails: Right;Left;Can reach both Secretary/administrator of Steps: 4   Home Layout: One level Home Equipment: None      Prior Function Prior Level of Function : Working/employed             Mobility Comments: 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, 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), driving, a couple falls going up the steps in the past 6 months ADLs Comments: bathes & dresses without assistance     Extremity/Trunk Assessment   Upper Extremity Assessment Upper Extremity Assessment: Defer to OT evaluation;Right hand dominant;LUE deficits/detail  LUE Deficits / Details: decreased grasp, decreased shoulder flexion compared to RUE    Lower Extremity Assessment Lower Extremity Assessment: LLE deficits/detail LLE Deficits / Details: decreased hip flexion in sitting       Communication   Communication Communication: No apparent difficulties    Cognition Arousal:  Alert Behavior During Therapy: WFL for tasks assessed/performed   PT - Cognitive impairments: Awareness                       PT - Cognition Comments: pt reports she's fine & ready to go back to work tomorrow Following commands: Occupational Hygienist Techniques: Verbal cues     General Comments General comments (skin integrity, edema, etc.): VSS    Exercises     Assessment/Plan    PT Assessment Patient needs continued PT services  PT Problem List Decreased strength;Decreased range of motion;Decreased activity tolerance;Decreased balance;Decreased mobility;Decreased knowledge of use of DME;Decreased safety awareness;Impaired sensation       PT Treatment Interventions DME instruction;Balance training;Gait training;Neuromuscular re-education;Stair training;Functional mobility training;Patient/family education;Therapeutic activities;Therapeutic exercise;Manual techniques    PT Goals (Current goals can be found in the Care Plan section)  Acute Rehab PT Goals Patient Stated Goal: get better, go to work tomorrow PT Goal Formulation: With patient Time For Goal Achievement: 05/09/24 Potential to Achieve Goals: Good    Frequency Min 3X/week     Co-evaluation               AM-PAC PT 6 Clicks Mobility  Outcome Measure Help needed turning from your back to your side while in a flat bed without using bedrails?: None Help needed moving from lying on your back to sitting on the side of a flat bed without using bedrails?: None Help needed moving to and from a bed to a chair (including a wheelchair)?: A Little Help needed standing up from a chair using your arms (e.g., wheelchair or bedside chair)?: A Little Help needed to walk in hospital room?: A Little Help needed climbing 3-5 steps with a railing? : A Little 6 Click Score: 20    End of Session   Activity Tolerance: Patient tolerated treatment well Patient left: in bed (in handoff to OT) Nurse  Communication: Mobility status PT Visit Diagnosis: Unsteadiness on feet (R26.81);Muscle weakness (generalized) (M62.81);Other abnormalities of gait and mobility (R26.89);Hemiplegia and hemiparesis Hemiplegia - Right/Left: Left Hemiplegia - dominant/non-dominant: Non-dominant Hemiplegia - caused by: Unspecified    Time: 9153-9095 PT Time Calculation (min) (ACUTE ONLY): 18 min   Charges:   PT Evaluation $PT Eval Low Complexity: 1 Low   PT General Charges $$ ACUTE PT VISIT: 1 Visit         Richerd Pinal, PT, DPT 04/25/24, 9:12 AM   Richerd CHRISTELLA Pinal 04/25/2024, 9:11 AM

## 2024-04-25 NOTE — Evaluation (Signed)
 Occupational Therapy Evaluation Patient Details Name: Jocelyn Simon MRN: [id number] DOB: 1965/09/20 Today's Date: 04/25/2024   History of Present Illness   Pt is a 58 y/o F admitted 04/24/24 after presenting with LUE numbness. MRI negative for acute issues. PMH: CVA, anxiety, COPD, HLD, HTN     Clinical Impressions Pt seen for OT evaluation this date. Prior to hospital admission, pt was independent in all aspects of ADL. Pt endorses working full time prior to admission. Pt lives with her spouse in a one-level home with four steps to enter and hand rails on both sides. Currently pt demonstrates mild impairments in LUE/LLE strength, coordination, and sensation. Pt is R hand dominant and requires setupA for assist for seated ADL and MIN-CGA assist for transfers due to decreased coordination of her LLE. Pt is eager to return to her PLOF with increased independence and functional use of his left arm and leg. Pt educated on safety awareness and, ways to promote skin integrity on this date. No cognitive or visual deficits noted with assessment on this date. Pt would benefit from skilled OT to address noted impairments and functional limitations (see below for any additional details) in order to maximize safety and independence while minimizing falls risk and caregiver burden. OT will follow acutely.     If plan is discharge home, recommend the following:   A little help with walking and/or transfers;A little help with bathing/dressing/bathroom;Assistance with cooking/housework;Assist for transportation     Functional Status Assessment   Patient has had a recent decline in their functional status and demonstrates the ability to make significant improvements in function in a reasonable and predictable amount of time.     Equipment Recommendations   Tub/shower seat      Precautions/Restrictions   Precautions Precautions: Fall Recall of Precautions/Restrictions:  Intact Restrictions Weight Bearing Restrictions Per Provider Order: No     Mobility Bed Mobility Overal bed mobility: Modified Independent Bed Mobility: Supine to Sit, Sit to Supine     Supine to sit: Modified independent (Device/Increase time), HOB elevated Sit to supine: HOB elevated, Modified independent (Device/Increase time)        Transfers Overall transfer level: Needs assistance Equipment used: None Transfers: Sit to/from Stand Sit to Stand: Min assist, Contact guard assist                  Balance Overall balance assessment: Needs assistance Sitting-balance support: Feet supported Sitting balance-Leahy Scale: Good Sitting balance - Comments: Steady reaching within BOS   Standing balance support: During functional activity, No upper extremity supported Standing balance-Leahy Scale: Fair Standing balance comment: Mild unsteady with no DME                           ADL either performed or assessed with clinical judgement   ADL Overall ADL's : Needs assistance/impaired Eating/Feeding: Sitting;Set up Eating/Feeding Details (indicate cue type and reason): Opening containers Grooming: Wash/dry face;Wash/dry hands;Standing;CGA               Lower Body Dressing: CGA;Sitting/lateral leans Lower Body Dressing Details (indicate cue type and reason): Don/doff bilateral socks via figure 4 method Toilet Transfer: Contact guard assist;Ambulation   Toileting- Clothing Manipulation and Hygiene: Supervision/safety;Sitting/lateral lean       Functional mobility during ADLs: Supervision/safety General ADL Comments: CGA initally, progressing to supervision with seated ADLs     Vision Patient Visual Report: No change from baseline       Perception  Perception: Within Functional Limits       Praxis Praxis: WFL       Pertinent Vitals/Pain Pain Assessment Pain Assessment: No/denies pain     Extremity/Trunk Assessment Upper Extremity  Assessment Upper Extremity Assessment: Right hand dominant;LUE deficits/detail LUE Deficits / Details: decreased grasp, decreased shoulder flexion compared to RUE LUE Sensation: decreased light touch LUE Coordination: decreased gross motor   Lower Extremity Assessment Lower Extremity Assessment: Defer to PT evaluation;Generalized weakness LLE Deficits / Details: decreased hip flexion in sitting   Cervical / Trunk Assessment Cervical / Trunk Assessment: Normal   Communication Communication Communication: No apparent difficulties   Cognition Arousal: Alert Behavior During Therapy: WFL for tasks assessed/performed Cognition: No apparent impairments             OT - Cognition Comments: A/Ox4                 Following commands: Intact       Cueing  General Comments   Cueing Techniques: Verbal cues  Pt endorses hurt feelings from a comment made by a doctor about her weight, and not feeling like she wants to eat due to the effects of the comment.   Exercises Exercises: Other exercises Other Exercises Other Exercises: Edu: Role of OT acute, safe ADL completion, sensation awareness during IADLs   Shoulder Instructions      Home Living Family/patient expects to be discharged to:: Private residence Living Arrangements: Spouse/significant other Available Help at Discharge: Family Type of Home: House Home Access: Stairs to enter Secretary/administrator of Steps: 4 Entrance Stairs-Rails: Right;Left;Can reach both Home Layout: One level     Bathroom Shower/Tub: Chief Strategy Officer: Standard     Home Equipment: None          Prior Functioning/Environment Prior Level of Function : Working/employed             Mobility Comments: 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, 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), driving, a couple falls going up the steps in the past 6 months ADLs Comments: bathes & dresses without assistance    OT Problem List:  Decreased strength;Decreased activity tolerance;Impaired balance (sitting and/or standing);Decreased coordination;Decreased safety awareness;Decreased knowledge of use of DME or AE;Decreased knowledge of precautions;Impaired sensation   OT Treatment/Interventions: Self-care/ADL training;Therapeutic exercise;Energy conservation;DME and/or AE instruction;Therapeutic activities;Patient/family education      OT Goals(Current goals can be found in the care plan section)   Acute Rehab OT Goals Patient Stated Goal: Return to work OT Goal Formulation: With patient Time For Goal Achievement: 05/09/24 Potential to Achieve Goals: Good ADL Goals Pt Will Perform Grooming: Independently;standing Pt Will Perform Lower Body Dressing: Independently;sitting/lateral leans Pt Will Transfer to Toilet: Independently;ambulating Pt Will Perform Toileting - Clothing Manipulation and hygiene: Independently;sitting/lateral leans   OT Frequency:  Min 2X/week                  AM-PAC OT 6 Clicks Daily Activity     Outcome Measure Help from another person eating meals?: None Help from another person taking care of personal grooming?: None Help from another person toileting, which includes using toliet, bedpan, or urinal?: None Help from another person bathing (including washing, rinsing, drying)?: A Little Help from another person to put on and taking off regular upper body clothing?: None Help from another person to put on and taking off regular lower body clothing?: A Little 6 Click Score: 22   End of Session Nurse Communication: Mobility status  Activity  Tolerance: Patient tolerated treatment well Patient left: in bed;with call bell/phone within reach  OT Visit Diagnosis: Unsteadiness on feet (R26.81);Repeated falls (R29.6);Other abnormalities of gait and mobility (R26.89);Muscle weakness (generalized) (M62.81)                Time: 9095-9084 OT Time Calculation (min): 11 min Charges:  OT  General Charges $OT Visit: 1 Visit OT Evaluation $OT Eval Low Complexity: 1 Low Larraine Colas M.S. OTR/L  04/25/24, 10:50 AM

## 2024-04-25 NOTE — Progress Notes (Signed)
 Subjective: Patient with continued LUE tingling although improved from yesterday.    Objective: Current vital signs: BP (!) 147/74 (BP Location: Right Arm)   Pulse 70   Temp 98.7 F (37.1 C) (Oral)   Resp 18   Ht 5' 1 (1.549 m)   Wt 91.7 kg   SpO2 96%   BMI 38.20 kg/m  Vital signs in last 24 hours: Temp:  [97.5 F (36.4 C)-98.7 F (37.1 C)] 98.7 F (37.1 C) (11/02 0800) Pulse Rate:  [70-81] 70 (11/02 0800) Resp:  [16-22] 18 (11/02 0800) BP: (110-147)/(74-98) 147/74 (11/02 0800) SpO2:  [95 %-100 %] 96 % (11/02 0800) Weight:  [91.7 kg] 91.7 kg (11/01 1403)  Intake/Output from previous day: No intake/output data recorded. Intake/Output this shift: No intake/output data recorded. Nutritional status:  Diet Order             Diet Heart Fluid consistency: Thin  Diet effective now                   Neurologic Exam: Mental Status: Alert, oriented, thought content appropriate.  Speech fluent without evidence of aphasia.  Able to follow 3 step commands without difficulty. Cranial Nerves: II: Visual fields grossly normal III,IV, VI: ptosis not present, extra-ocular motions intact bilaterally V,VII: smile symmetric, facial light touch sensation normal bilaterally VIII: hearing normal bilaterally XI: bilateral shoulder shrug XII: midline tongue extension Motor: Right : Upper extremity   5/5    Left:     Upper extremity   5/5  Lower extremity   5/5     Lower extremity   5/5 Tone and bulk:normal tone throughout; no atrophy noted Sensory: Pinprick and light touch decreased in the LUE Deep Tendon Reflexes: Symmetric throughout    Lab Results: Basic Metabolic Panel: Recent Labs  Lab 04/24/24 1130  NA 138  K 3.6  CL 99  CO2 26  GLUCOSE 108*  BUN 18  CREATININE 0.59  CALCIUM  8.9    Liver Function Tests: Recent Labs  Lab 04/24/24 1130  AST 20  ALT 19  ALKPHOS 46  BILITOT 0.5  PROT 7.9  ALBUMIN 3.9   No results for input(s): LIPASE, AMYLASE in the  last 168 hours. No results for input(s): AMMONIA in the last 168 hours.  CBC: Recent Labs  Lab 04/24/24 1130  WBC 5.3  NEUTROABS 2.0  HGB 11.5*  HCT 37.3  MCV 89.0  PLT 291    Cardiac Enzymes: No results for input(s): CKTOTAL, CKMB, CKMBINDEX, TROPONINI in the last 168 hours.  Lipid Panel: Recent Labs  Lab 04/25/24 0610  CHOL 279*  TRIG 184*  HDL 47  CHOLHDL 5.9  VLDL 37  LDLCALC 804*    CBG: Recent Labs  Lab 04/24/24 1025  GLUCAP 132*    Microbiology: Results for orders placed or performed during the hospital encounter of 11/06/23  Resp panel by RT-PCR (RSV, Flu A&B, Covid) Anterior Nasal Swab     Status: None   Collection Time: 11/06/23  9:45 AM   Specimen: Anterior Nasal Swab  Result Value Ref Range Status   SARS Coronavirus 2 by RT PCR NEGATIVE NEGATIVE Final    Comment: (NOTE) SARS-CoV-2 target nucleic acids are NOT DETECTED.  The SARS-CoV-2 RNA is generally detectable in upper respiratory specimens during the acute phase of infection. The lowest concentration of SARS-CoV-2 viral copies this assay can detect is 138 copies/mL. A negative result does not preclude SARS-Cov-2 infection and should not be used as the sole basis for  treatment or other patient management decisions. A negative result may occur with  improper specimen collection/handling, submission of specimen other than nasopharyngeal swab, presence of viral mutation(s) within the areas targeted by this assay, and inadequate number of viral copies(<138 copies/mL). A negative result must be combined with clinical observations, patient history, and epidemiological information. The expected result is Negative.  Fact Sheet for Patients:  bloggercourse.com  Fact Sheet for Healthcare Providers:  seriousbroker.it  This test is no t yet approved or cleared by the [place]  FDA and  has been authorized for detection and/or diagnosis  of SARS-CoV-2 by FDA under an Emergency Use Authorization (EUA). This EUA will remain  in effect (meaning this test can be used) for the duration of the COVID-19 declaration under Section 564(b)(1) of the Act, 21 U.S.C.section 360bbb-3(b)(1), unless the authorization is terminated  or revoked sooner.       Influenza A by PCR NEGATIVE NEGATIVE Final   Influenza B by PCR NEGATIVE NEGATIVE Final    Comment: (NOTE) The Xpert Xpress SARS-CoV-2/FLU/RSV plus assay is intended as an aid in the diagnosis of influenza from Nasopharyngeal swab specimens and should not be used as a sole basis for treatment. Nasal washings and aspirates are unacceptable for Xpert Xpress SARS-CoV-2/FLU/RSV testing.  Fact Sheet for Patients: bloggercourse.com  Fact Sheet for Healthcare Providers: seriousbroker.it  This test is not yet approved or cleared by the [place]  FDA and has been authorized for detection and/or diagnosis of SARS-CoV-2 by FDA under an Emergency Use Authorization (EUA). This EUA will remain in effect (meaning this test can be used) for the duration of the COVID-19 declaration under Section 564(b)(1) of the Act, 21 U.S.C. section 360bbb-3(b)(1), unless the authorization is terminated or revoked.     Resp Syncytial Virus by PCR NEGATIVE NEGATIVE Final    Comment: (NOTE) Fact Sheet for Patients: bloggercourse.com  Fact Sheet for Healthcare Providers: seriousbroker.it  This test is not yet approved or cleared by the [place]  FDA and has been authorized for detection and/or diagnosis of SARS-CoV-2 by FDA under an Emergency Use Authorization (EUA). This EUA will remain in effect (meaning this test can be used) for the duration of the COVID-19 declaration under Section 564(b)(1) of the Act, 21 U.S.C. section 360bbb-3(b)(1), unless the authorization is terminated  or revoked.  Performed at [facility], [address] [place], [place] [address]     Coagulation Studies: Recent Labs    04/24/24 1419  LABPROT 13.3  INR 1.0    Imaging: MR BRAIN WO CONTRAST Result Date: 04/24/2024 EXAM: MRI BRAIN WITHOUT CONTRAST 04/24/2024 05:04:29 PM TECHNIQUE: Multiplanar multisequence MRI of the head/brain was performed without the administration of intravenous contrast. COMPARISON: 04/01/2016 CLINICAL HISTORY: Neuro deficit, acute, stroke suspected. FINDINGS: BRAIN AND VENTRICLES: No acute infarct. No intracranial hemorrhage. No mass. No midline shift. No hydrocephalus. The sella is unremarkable. Normal flow voids. ORBITS: No acute abnormality. SINUSES AND MASTOIDS: No acute abnormality. BONES AND SOFT TISSUES: Normal marrow signal. No acute soft tissue abnormality. IMPRESSION: 1. No acute intracranial abnormality. Electronically signed by: Franky Stanford MD 04/24/2024 05:33 PM EDT RP Workstation: [id number]   CT ANGIO HEAD NECK W WO CM Result Date: 04/24/2024 EXAM: CTA HEAD AND NECK WITHOUT AND WITH 04/24/2024 11:06:06 AM TECHNIQUE: CTA of the head and neck was performed without and with the administration of intravenous contrast. Multiplanar 2D and/or 3D reformatted images are provided for review. Automated exposure control, iterative reconstruction, and/or weight based adjustment of the mA/kV was utilized  to reduce the radiation dose to as low as reasonably achievable. Stenosis of the internal carotid arteries measured using NASCET criteria. COMPARISON: None available CLINICAL HISTORY: Neuro deficit, acute, stroke suspected FINDINGS: AORTIC ARCH AND ARCH VESSELS: No dissection or arterial injury. No significant stenosis of the brachiocephalic or subclavian arteries. CERVICAL CAROTID ARTERIES: No dissection, arterial injury, or hemodynamically significant stenosis by NASCET criteria. CERVICAL VERTEBRAL ARTERIES: No dissection, arterial injury, or significant  stenosis. LUNGS AND MEDIASTINUM: Unremarkable. SOFT TISSUES: No acute abnormality. BONES: No acute abnormality. ANTERIOR CIRCULATION: No significant stenosis of the internal carotid arteries. No significant stenosis of the anterior cerebral arteries. No significant stenosis of the middle cerebral arteries. No aneurysm. POSTERIOR CIRCULATION: No significant stenosis of the posterior cerebral arteries. No significant stenosis of the basilar artery. No significant stenosis of the vertebral arteries. No aneurysm. OTHER: No dural venous sinus thrombosis on this non-dedicated study. IMPRESSION: 1. No large vessel occlusion, hemodynamically significant stenosis, or aneurysm in the head or neck. Electronically signed by: Franky Stanford MD 04/24/2024 11:45 AM EDT RP Workstation: [id number]   CT HEAD CODE STROKE WO CONTRAST Result Date: 04/24/2024 EXAM: CT HEAD WITHOUT 04/24/2024 10:38:18 AM TECHNIQUE: CT of the head was performed without the administration of intravenous contrast. Automated exposure control, iterative reconstruction, and/or weight based adjustment of the mA/kV was utilized to reduce the radiation dose to as low as reasonably achievable. COMPARISON: None available. CLINICAL HISTORY: Neuro deficit, acute, stroke suspected FINDINGS: BRAIN AND VENTRICLES: No acute intracranial hemorrhage. No mass effect or midline shift. No extra-axial fluid collection. No evidence of acute infarct. No hydrocephalus. Partially empty sella. ORBITS: No acute abnormality. SINUSES AND MASTOIDS: No acute abnormality. SOFT TISSUES AND SKULL: No acute skull fracture. No acute soft tissue abnormality. [place] Stroke Program Early CT Score (ASPECTS) Ganglionic (caudate, internal capsule, lentiform nucleus, insula, M1-M3): 7 Supraganglionic (M4-M6): 3 Total: 10 The above findings were communicated to Doctor Yostin Malacara at 10:44 AM 04/24/2024. IMPRESSION: 1. No acute intracranial abnormality related to the suspected stroke. 2. ASPECTS: 10.  Electronically signed by: Evalene Coho MD 04/24/2024 10:45 AM EDT RP Workstation: [id number]    Medications: Scheduled:  aspirin  EC  81 mg Oral Daily   enoxaparin (LOVENOX) injection  45 mg Subcutaneous Q24H   [START ON 04/26/2024] influenza vac split trivalent PF  0.5 mL Intramuscular Tomorrow-1000   [START ON 04/26/2024] pneumococcal 20-valent conjugate vaccine  0.5 mL Intramuscular Tomorrow-1000   sodium chloride  flush  3 mL Intravenous Once    Assessment/Plan:  58 y.o. female with a history of CVA, HTN, HLD, COPD and anxiety who reports that she awakened today at baseline.  After getting ready for work noted numbness in her LUE.  Symptoms did not resolve therefore patient presented for evaluation.   Patient on ASA at home.  From chart review patient has had multiple presentations in the past with left sided weakness.  MRI negative on all of these occasions.  MRI of the brain from this admission personally reviewed and shows no evidence of acute infarct as well.   With symptoms still persisting, doubt TIA.  Would investigate cervical etiology.  Recommendations: Continue ASA MRI of the cervical spine without contrast.  May be performed on an outpatient basis.   LOS: 0 days   Sonny Hock, MD Neurology  04/25/2024  11:01 AM

## 2024-04-25 NOTE — Progress Notes (Signed)
 PROGRESS NOTE    EVONNE RINKS  FMW:[id number] DOB: 05/06/1966 DOA: 04/24/2024 PCP: [facility]  Chief Complaint  Patient presents with   Code Stroke    Hospital Course:  Jocelyn Simon is a 58 year old female with history of prior CVA, anxiety, COPD, hyperlipidemia, hypertension, who presents to the ED with left upper extremity numbness.  Labs and vitals largely unremarkable.  High-sensitivity troponin less than 2.  She was admitted for stroke workup  Subjective: This morning patient is still complaining of some numbness in her left arm.  She reports it goes all the way to her fingers and travels all the way from her shoulder.  She denies any chest pain.  She is very anxious about possible cardiac etiology.  We discussed cervical MRI and she would like to have remained in house to get this workup.   Objective: Vitals:   04/24/24 1920 04/25/24 0001 04/25/24 0416 04/25/24 0800  BP: 128/84 113/79 128/80 (!) 147/74  Pulse: 73 74 71 70  Resp: 18 16 18 18   Temp: (!) 97.5 F (36.4 C) 98.1 F (36.7 C) 98 F (36.7 C) 98.7 F (37.1 C)  TempSrc: Oral Oral  Oral  SpO2: 97% 95% 96% 96%  Weight:      Height:       No intake or output data in the 24 hours ending 04/25/24 1538 Filed Weights   04/24/24 1403  Weight: 91.7 kg    Examination: General exam: Appears calm and comfortable, NAD  Respiratory system: No work of breathing, symmetric chest wall expansion Cardiovascular system: S1 & S2 heard, RRR.  Gastrointestinal system: Abdomen is nondistended, soft and nontender.  Neuro: Alert and oriented Extremities: Symmetric, expected ROM Skin: No rashes, lesions Psychiatry: Demonstrates appropriate judgement and insight. Mood & affect appropriate for situation.   Assessment & Plan:  Principal Problem:   Stroke-like symptom Active Problems:   Essential hypertension   Hyperlipidemia   Chronic obstructive pulmonary disease (HCC)   Gastroesophageal reflux  disease without esophagitis   Claustrophobia   Morbid obesity (HCC)   Left upper extremity numbness - Brain MRI without acute intracranial abnormality - May be cervical radiculopathy.  Cervical MRI has been ordered to better evaluate - May also be some component of peripheral neuropathy.  Hemoglobin A1c is under good control at this time. - Patient has no chest pain.  High-sensitivity troponin less than 2.  EKG on arrival NSR - No skilled therapy needs - UDS positive for barbiturates.  Patient appears to be taking Fioricet  outpatient.  Stroke ruled out - Stroke workup was also initiated - LDL above goal - Continue aspirin  and high intensity statin due to history of CVA - Neurology consulted, no acute neurology needs  History of CVA - Continue home meds  Hypertension - SBP goal under 150 - Resume home meds, titrate as needed  Hyperlipidemia - LDL above goal.  Start high intensity statin  COPD not currently in exacerbation - Continue home meds - No O2 needs.  No wheezing.  BMI 38 Class II obesity - Outpatient follow up for lifestyle modification and risk factor management  DVT prophylaxis: Lovenox   Code Status: Full Code Disposition:  Pending cervical MRI - likely home in AM  Consultants:  Treatment Team:  Consulting Physician: Bobbette Frank, MD Consulting Physician: Germaine Raring, MD  Procedures:    Antimicrobials:  Anti-infectives (From admission, onward)    None       Data Reviewed: I have personally reviewed following  labs and imaging studies CBC: Recent Labs  Lab 04/24/24 1130  WBC 5.3  NEUTROABS 2.0  HGB 11.5*  HCT 37.3  MCV 89.0  PLT 291   Basic Metabolic Panel: Recent Labs  Lab 04/24/24 1130  NA 138  K 3.6  CL 99  CO2 26  GLUCOSE 108*  BUN 18  CREATININE 0.59  CALCIUM  8.9   GFR: Estimated Creatinine Clearance: 79.1 mL/min (by C-G formula based on SCr of 0.59 mg/dL). Liver Function Tests: Recent Labs  Lab  04/24/24 1130  AST 20  ALT 19  ALKPHOS 46  BILITOT 0.5  PROT 7.9  ALBUMIN 3.9   CBG: Recent Labs  Lab 04/24/24 1025  GLUCAP 132*    No results found for this or any previous visit (from the past 240 hours).   Radiology Studies: MR BRAIN WO CONTRAST Result Date: 04/24/2024 EXAM: MRI BRAIN WITHOUT CONTRAST 04/24/2024 05:04:29 PM TECHNIQUE: Multiplanar multisequence MRI of the head/brain was performed without the administration of intravenous contrast. COMPARISON: 04/01/2016 CLINICAL HISTORY: Neuro deficit, acute, stroke suspected. FINDINGS: BRAIN AND VENTRICLES: No acute infarct. No intracranial hemorrhage. No mass. No midline shift. No hydrocephalus. The sella is unremarkable. Normal flow voids. ORBITS: No acute abnormality. SINUSES AND MASTOIDS: No acute abnormality. BONES AND SOFT TISSUES: Normal marrow signal. No acute soft tissue abnormality. IMPRESSION: 1. No acute intracranial abnormality. Electronically signed by: Franky Stanford MD 04/24/2024 05:33 PM EDT RP Workstation: [id number]   CT ANGIO HEAD NECK W WO CM Result Date: 04/24/2024 EXAM: CTA HEAD AND NECK WITHOUT AND WITH 04/24/2024 11:06:06 AM TECHNIQUE: CTA of the head and neck was performed without and with the administration of intravenous contrast. Multiplanar 2D and/or 3D reformatted images are provided for review. Automated exposure control, iterative reconstruction, and/or weight based adjustment of the mA/kV was utilized to reduce the radiation dose to as low as reasonably achievable. Stenosis of the internal carotid arteries measured using NASCET criteria. COMPARISON: None available CLINICAL HISTORY: Neuro deficit, acute, stroke suspected FINDINGS: AORTIC ARCH AND ARCH VESSELS: No dissection or arterial injury. No significant stenosis of the brachiocephalic or subclavian arteries. CERVICAL CAROTID ARTERIES: No dissection, arterial injury, or hemodynamically significant stenosis by NASCET criteria. CERVICAL VERTEBRAL ARTERIES:  No dissection, arterial injury, or significant stenosis. LUNGS AND MEDIASTINUM: Unremarkable. SOFT TISSUES: No acute abnormality. BONES: No acute abnormality. ANTERIOR CIRCULATION: No significant stenosis of the internal carotid arteries. No significant stenosis of the anterior cerebral arteries. No significant stenosis of the middle cerebral arteries. No aneurysm. POSTERIOR CIRCULATION: No significant stenosis of the posterior cerebral arteries. No significant stenosis of the basilar artery. No significant stenosis of the vertebral arteries. No aneurysm. OTHER: No dural venous sinus thrombosis on this non-dedicated study. IMPRESSION: 1. No large vessel occlusion, hemodynamically significant stenosis, or aneurysm in the head or neck. Electronically signed by: Franky Stanford MD 04/24/2024 11:45 AM EDT RP Workstation: [id number]   CT HEAD CODE STROKE WO CONTRAST Result Date: 04/24/2024 EXAM: CT HEAD WITHOUT 04/24/2024 10:38:18 AM TECHNIQUE: CT of the head was performed without the administration of intravenous contrast. Automated exposure control, iterative reconstruction, and/or weight based adjustment of the mA/kV was utilized to reduce the radiation dose to as low as reasonably achievable. COMPARISON: None available. CLINICAL HISTORY: Neuro deficit, acute, stroke suspected FINDINGS: BRAIN AND VENTRICLES: No acute intracranial hemorrhage. No mass effect or midline shift. No extra-axial fluid collection. No evidence of acute infarct. No hydrocephalus. Partially empty sella. ORBITS: No acute abnormality. SINUSES AND MASTOIDS: No acute abnormality. SOFT TISSUES AND SKULL:  No acute skull fracture. No acute soft tissue abnormality. [place] Stroke Program Early CT Score (ASPECTS) Ganglionic (caudate, internal capsule, lentiform nucleus, insula, M1-M3): 7 Supraganglionic (M4-M6): 3 Total: 10 The above findings were communicated to Doctor Reynolds at 10:44 AM 04/24/2024. IMPRESSION: 1. No acute intracranial abnormality  related to the suspected stroke. 2. ASPECTS: 10. Electronically signed by: Evalene Coho MD 04/24/2024 10:45 AM EDT RP Workstation: [id number]    Scheduled Meds:  aspirin  EC  81 mg Oral Daily   enoxaparin (LOVENOX) injection  45 mg Subcutaneous Q24H   [START ON 04/26/2024] influenza vac split trivalent PF  0.5 mL Intramuscular Tomorrow-1000   [START ON 04/26/2024] pneumococcal 20-valent conjugate vaccine  0.5 mL Intramuscular Tomorrow-1000   sodium chloride  flush  3 mL Intravenous Once   Continuous Infusions:   LOS: 0 days  MDM: Patient is high risk for one or more organ failure.  They necessitate ongoing hospitalization for continued IV therapies and subsequent lab monitoring. Total time spent interpreting labs and vitals, reviewing the medical record, coordinating care amongst consultants and care team members, directly assessing and discussing care with the patient and/or family: 55 min  Audiel Scheiber, DO [facility]  To contact the attending physician between 7A-7P please use Epic Chat. To contact the covering physician during after hours 7P-7A, please review Amion.  04/25/2024, 3:38 PM   *This document has been created with the assistance of dictation software. Please excuse typographical errors. *

## 2024-04-26 ENCOUNTER — Other Ambulatory Visit: Payer: Self-pay

## 2024-04-26 DIAGNOSIS — R2990 Unspecified symptoms and signs involving the nervous system: Secondary | ICD-10-CM | POA: Diagnosis not present

## 2024-04-26 MED ORDER — ATORVASTATIN CALCIUM 80 MG PO TABS
80.0000 mg | ORAL_TABLET | Freq: Every day | ORAL | 0 refills | Status: AC
  Filled 2024-04-26: qty 30, 30d supply, fill #0

## 2024-04-26 MED ORDER — SUMATRIPTAN SUCCINATE 100 MG PO TABS
100.0000 mg | ORAL_TABLET | Freq: Once | ORAL | 0 refills | Status: AC | PRN
  Filled 2024-04-26: qty 9, 30d supply, fill #0

## 2024-04-26 NOTE — Plan of Care (Signed)
 Neurology plan of care  MRI c spine wo contrast 1. Small left paracentral disc protrusion at C3-4 without significant stenosis. 2. Cervical spondylosis at C5-6 and C6-7 with resultant mild to moderate left C6 and c7 foraminal narrowing as above. 3. No significant spinal stenosis within the cervical spine.  LUE numbness episodes may be related to moderate L C6 and C6 neuroforaminal narrowing as seen on c spine MRI. This is not an emergent finding. LUE numbnesws likely 2/2 cervical radiculopathy. If the numbness is uncomfortable and persistent she may be started on low dose lyrica 25mg  bid to see if that helps. I will arrange outpatient f/u. No further inpatient neurology workup indicated at this time. We will sign off, but please feel free to reconsult if additional neurologic concerns arise.  Elida Ross, MD [facility] [phone]  If 7pm- 7am, please page neurology on call as listed in AMION.

## 2024-04-26 NOTE — Discharge Summary (Signed)
 DISCHARGE SUMMARY    Jocelyn Simon FMW:[id number] DOB: [birth year] DOA: 04/24/2024  PCP: [facility]  Admit date: 04/24/2024 Discharge date: 04/26/2024   Recommendations for Outpatient Follow-up:  Follow up with PCP in 1-2 weeks to establish care and continue chronic condition management  Hospital Course: Jocelyn Simon is a 58 year old female with history of prior CVA, anxiety, COPD, hyperlipidemia, hypertension, who presents to the ED with left upper extremity numbness.  Labs and vitals largely unremarkable.  High-sensitivity troponin less than 2.  She was admitted for stroke workup.  Brain MRI was without acute intracranial abnormalities.  Cervical MRI revealing small paracentral disc retrusion at C3-4, cervical spondylosis C5-7 with moderate left foraminal narrowing.  No significant or urgent spinal stenosis seen.  Upper extremity numbness is intermittent, likely caused by cervical radiculopathy.  Patient reports gabapentin  does not work with her.  I have advised for physical therapy and close PCP follow-up.  May benefit from duloxetine versus Lyrica in the future.  This can be done on an outpatient basis.    Left upper extremity numbness - Brain MRI without acute intracranial abnormality - Likely secondary to cervical radiculopathy.  Cervical spondylosis and disc protrusion seen on cervical MRI.  No significant spinal stenosis. - Follow-up with PCP for PT outpatient and initiation of Lyrica if pain becomes more persistent - Patient has no chest pain.  High-sensitivity troponin less than 2.  EKG on arrival NSR - No skilled therapy needs - UDS positive for barbiturates.  Patient appears to be taking Fioricet    Stroke ruled out - Stroke workup was also initiated - LDL above goal - Continue aspirin  and high intensity statin due to history of CVA - Neurology consulted, no acute neurology needs.  Outpatient referral   History of CVA - Continue home meds.  Increase  statin.   Hypertension - SBP goal under 150 - Resume home meds, titrate as needed   Hyperlipidemia - LDL above goal.  Start high intensity statin   COPD not currently in exacerbation - Continue home meds - No O2 needs.  No wheezing.   BMI 38 Class II obesity - Outpatient follow up for lifestyle modification and risk factor management - Patient had many questions regarding eating and caloric restrictions.  I offered nutritionist discussion while admitted but patient did not want to delay discharge.  This can be done outpatient as well    Discharge Instructions  Discharge Instructions     Ambulatory referral to Neurology   Complete by: As directed    An appointment is requested in approximately: 6 wks   Call MD for:  difficulty breathing, headache or visual disturbances   Complete by: As directed    Call MD for:  persistant dizziness or light-headedness   Complete by: As directed    Call MD for:  persistant nausea and vomiting   Complete by: As directed    Call MD for:  severe uncontrolled pain   Complete by: As directed    Call MD for:  temperature >100.4   Complete by: As directed    Diet - low sodium heart healthy   Complete by: As directed    Discharge instructions   Complete by: As directed    Please establish care with a Primary Care Physician.  I recommend [facility]. They can help you establish with the desired specialists and advise you on weight loss and nutrition.   Increase activity slowly   Complete by: As directed  Allergies as of 04/26/2024       Reactions   Gadolinium Derivatives Hives   Ativan  for anxiolysis also given prior to MRI; both listed as allergies since it was unclear which agent caused a reaction   Lorazepam  Hives   Given prior to MRI (also given multihance gadolinium); both listed as allergies since it was unclear which agent caused a reaction        Medication List     STOP taking these medications     azithromycin  250 MG tablet Commonly known as: Zithromax  Z-Pak   benzonatate  100 MG capsule Commonly known as: Tessalon  Perles   ibuprofen  800 MG tablet Commonly known as: ADVIL        TAKE these medications    albuterol  108 (90 Base) MCG/ACT inhaler Commonly known as: VENTOLIN  HFA Inhale 2 puffs into the lungs every 6 (six) hours as needed for wheezing or shortness of breath.   amLODipine  5 MG tablet Commonly known as: NORVASC  Take 1 tablet (5 mg total) by mouth daily. What changed: when to take this   aspirin  EC 81 MG tablet Take 1 tablet (81 mg total) by mouth daily.   atorvastatin  80 MG tablet Commonly known as: LIPITOR Take 1 tablet (80 mg total) by mouth daily. Start taking on: April 27, 2024 What changed:  medication strength how much to take   dicyclomine  10 MG capsule Commonly known as: Bentyl  Take 1 capsule (10 mg total) by mouth 4 (four) times daily for 14 days.   fluticasone  50 MCG/ACT nasal spray Commonly known as: Flonase  Place 2 sprays into both nostrils daily.   hydrochlorothiazide  25 MG tablet Commonly known as: HYDRODIURIL  Take 1 tablet (25 mg total) by mouth daily.   metFORMIN 500 MG tablet Commonly known as: GLUCOPHAGE Take 500 mg by mouth 2 (two) times daily with a meal.   omeprazole 20 MG capsule Commonly known as: PRILOSEC Take 20 mg by mouth daily.   ondansetron  4 MG disintegrating tablet Commonly known as: ZOFRAN -ODT Take 1 tablet (4 mg total) by mouth every 8 (eight) hours as needed for nausea or vomiting.   SUMAtriptan 100 MG tablet Commonly known as: IMITREX Take 1 tablet (100 mg total) by mouth once as needed for migraine. May repeat in 2 hours if headache persists or recurs.  Do not exceed two doses in a day               Durable Medical Equipment  (From admission, onward)           Start     Ordered   04/26/24 1143  For home use only DME 3 n 1  Once        04/26/24 1143   04/26/24 1132  For home use  only DME Walker rolling  Once       Question Answer Comment  Walker: With 5 Inch Wheels   Patient needs a walker to treat with the following condition Gait disturbance      04/26/24 1132            Follow-up Information     [facility] Follow up.   Why: Hospital follow up Contact information: [address] [place] [address] [phone]         [facility] Follow up.   Specialty: Rehabilitation Why: The facility will call to schedule appointment for PT/OT. Contact information: [address] [place]  [phone]  [facility]  Follow up.   Why: Agency will deliver the RW and Transfer Bench to the patient's room. Contact information: [address] [place] [address] [phone]                Allergies  Allergen Reactions   Gadolinium Derivatives Hives    Ativan  for anxiolysis also given prior to MRI; both listed as allergies since it was unclear which agent caused a reaction   Lorazepam  Hives    Given prior to MRI (also given multihance gadolinium); both listed as allergies since it was unclear which agent caused a reaction    Consultations: Treatment Team:  Germaine Raring, MD   Procedures/Studies: MR CERVICAL SPINE WO CONTRAST Result Date: 04/25/2024 EXAM: MRI CERVICAL SPINE WITHOUT CONTRAST 04/25/2024 04:48:00 PM TECHNIQUE: Multiplanar multisequence MRI of the cervical spine was performed without the administration of intravenous contrast. COMPARISON: None available. CLINICAL HISTORY: Cervical radiculopathy, left upper extremity parathesias. FINDINGS: BONES AND ALIGNMENT: Straightening with reversal of the normal cervical lordosis, apex at C5. No significant listhesis. Normal vertebral body heights. Marrow signal is unremarkable. No abnormal enhancement. SPINAL CORD: Normal spinal cord size. Normal spinal cord signal. SOFT  TISSUES: No paraspinal mass. C2-C3: Mild uncovertebral spurring without significant disc bulge. Mild to moderate left facet hypertrophy. No spinal canal stenosis. The neural foramina are patent. C3-C4: Small left paracentral disc protrusion and impingement left ventral thecal sac (series 8, image 11). Mild right-sided facet arthrosis. No spinal canal stenosis. The neural foramina remain patent. C4-C5: Degenerative intervertebral disc space narrowing with diffuse disc osteophyte complex. Flattening of the ventral thecal sac without significant spinal canal stenosis. The neural foramina remain adequately patent. C5-C6: Degenerative intervertebral disc space narrowing diffuse disc osteophyte complex, asymmetric to the left. Flattening of the ventral thecal sac without significant spinal canal stenosis. Mild to moderate left C6 foraminal narrowing. The right neural foramen remains patent. C6-C7: Disc bulge with left greater than right uncovertebral spurring. No spinal canal stenosis. Moderate left C7 foraminal stenosis. The right neural foramen is adequately patent. C7-T1: Normal interspace. Mild left greater than right facet hypertrophy. No spinal canal stenosis or neural foraminal narrowing. IMPRESSION: 1. Small left paracentral disc protrusion at C3-4 without significant stenosis. 2. Cervical spondylosis at C5-6 and C6-7 with resultant mild to moderate left C6 and c7 foraminal narrowing as above. 3. No significant spinal stenosis within the cervical spine. Electronically signed by: Morene Hoard MD 04/25/2024 07:08 PM EST RP Workstation: [id number]   MR BRAIN WO CONTRAST Result Date: 04/24/2024 EXAM: MRI BRAIN WITHOUT CONTRAST 04/24/2024 05:04:29 PM TECHNIQUE: Multiplanar multisequence MRI of the head/brain was performed without the administration of intravenous contrast. COMPARISON: 04/01/2016 CLINICAL HISTORY: Neuro deficit, acute, stroke suspected. FINDINGS: BRAIN AND VENTRICLES: No acute infarct. No  intracranial hemorrhage. No mass. No midline shift. No hydrocephalus. The sella is unremarkable. Normal flow voids. ORBITS: No acute abnormality. SINUSES AND MASTOIDS: No acute abnormality. BONES AND SOFT TISSUES: Normal marrow signal. No acute soft tissue abnormality. IMPRESSION: 1. No acute intracranial abnormality. Electronically signed by: Franky Stanford MD 04/24/2024 05:33 PM EDT RP Workstation: [id number]   CT ANGIO HEAD NECK W WO CM Result Date: 04/24/2024 EXAM: CTA HEAD AND NECK WITHOUT AND WITH 04/24/2024 11:06:06 AM TECHNIQUE: CTA of the head and neck was performed without and with the administration of intravenous contrast. Multiplanar 2D and/or 3D reformatted images are provided for review. Automated exposure control, iterative reconstruction, and/or weight based adjustment of the mA/kV was utilized to reduce the radiation dose  to as low as reasonably achievable. Stenosis of the internal carotid arteries measured using NASCET criteria. COMPARISON: None available CLINICAL HISTORY: Neuro deficit, acute, stroke suspected FINDINGS: AORTIC ARCH AND ARCH VESSELS: No dissection or arterial injury. No significant stenosis of the brachiocephalic or subclavian arteries. CERVICAL CAROTID ARTERIES: No dissection, arterial injury, or hemodynamically significant stenosis by NASCET criteria. CERVICAL VERTEBRAL ARTERIES: No dissection, arterial injury, or significant stenosis. LUNGS AND MEDIASTINUM: Unremarkable. SOFT TISSUES: No acute abnormality. BONES: No acute abnormality. ANTERIOR CIRCULATION: No significant stenosis of the internal carotid arteries. No significant stenosis of the anterior cerebral arteries. No significant stenosis of the middle cerebral arteries. No aneurysm. POSTERIOR CIRCULATION: No significant stenosis of the posterior cerebral arteries. No significant stenosis of the basilar artery. No significant stenosis of the vertebral arteries. No aneurysm. OTHER: No dural venous sinus thrombosis on this  non-dedicated study. IMPRESSION: 1. No large vessel occlusion, hemodynamically significant stenosis, or aneurysm in the head or neck. Electronically signed by: Franky Stanford MD 04/24/2024 11:45 AM EDT RP Workstation: [id number]   CT HEAD CODE STROKE WO CONTRAST Result Date: 04/24/2024 EXAM: CT HEAD WITHOUT 04/24/2024 10:38:18 AM TECHNIQUE: CT of the head was performed without the administration of intravenous contrast. Automated exposure control, iterative reconstruction, and/or weight based adjustment of the mA/kV was utilized to reduce the radiation dose to as low as reasonably achievable. COMPARISON: None available. CLINICAL HISTORY: Neuro deficit, acute, stroke suspected FINDINGS: BRAIN AND VENTRICLES: No acute intracranial hemorrhage. No mass effect or midline shift. No extra-axial fluid collection. No evidence of acute infarct. No hydrocephalus. Partially empty sella. ORBITS: No acute abnormality. SINUSES AND MASTOIDS: No acute abnormality. SOFT TISSUES AND SKULL: No acute skull fracture. No acute soft tissue abnormality. [place] Stroke Program Early CT Score (ASPECTS) Ganglionic (caudate, internal capsule, lentiform nucleus, insula, M1-M3): 7 Supraganglionic (M4-M6): 3 Total: 10 The above findings were communicated to Doctor Reynolds at 10:44 AM 04/24/2024. IMPRESSION: 1. No acute intracranial abnormality related to the suspected stroke. 2. ASPECTS: 10. Electronically signed by: Evalene Coho MD 04/24/2024 10:45 AM EDT RP Workstation: [id number]      Discharge Exam: Vitals:   04/26/24 0445 04/26/24 0853  BP: 132/88 123/76  Pulse: 82 89  Resp:  18  Temp: 98.6 F (37 C) 98.4 F (36.9 C)  SpO2: 91% 97%   Vitals:   04/25/24 1739 04/25/24 2017 04/26/24 0445 04/26/24 0853  BP: 130/88 124/70 132/88 123/76  Pulse: 88 89 82 89  Resp: 17   18  Temp: 98.1 F (36.7 C) 98.7 F (37.1 C) 98.6 F (37 C) 98.4 F (36.9 C)  TempSrc:  Oral Oral Oral  SpO2: 96% 100% 91% 97%  Weight:      Height:         Constitutional:  Normal appearance. Non toxic-appearing.  HENT: Head Normocephalic and atraumatic.  Mucous membranes are moist.  Eyes:  Extraocular intact. Conjunctivae normal.  Cardiovascular: Rate and Rhythm: Normal rate and regular rhythm.  Pulmonary: Non labored, symmetric rise of chest wall.  Skin: warm and dry. not jaundiced.  Neurological: No focal deficit present. alert. Oriented.  Psychiatric: Mood and Affect congruent.    The results of significant diagnostics from this hospitalization (including imaging, microbiology, ancillary and laboratory) are listed below for reference.     Microbiology: No results found for this or any previous visit (from the past 240 hours).   Labs: BNP (last 3 results) No results for input(s): BNP in the last 8760 hours. Basic Metabolic Panel: Recent Labs  Lab  04/24/24 1130  NA 138  K 3.6  CL 99  CO2 26  GLUCOSE 108*  BUN 18  CREATININE 0.59  CALCIUM  8.9   Liver Function Tests: Recent Labs  Lab 04/24/24 1130  AST 20  ALT 19  ALKPHOS 46  BILITOT 0.5  PROT 7.9  ALBUMIN 3.9   No results for input(s): LIPASE, AMYLASE in the last 168 hours. No results for input(s): AMMONIA in the last 168 hours. CBC: Recent Labs  Lab 04/24/24 1130  WBC 5.3  NEUTROABS 2.0  HGB 11.5*  HCT 37.3  MCV 89.0  PLT 291   Cardiac Enzymes: No results for input(s): CKTOTAL, CKMB, CKMBINDEX, TROPONINI in the last 168 hours. BNP: Invalid input(s): POCBNP CBG: Recent Labs  Lab 04/24/24 1025  GLUCAP 132*   D-Dimer No results for input(s): DDIMER in the last 72 hours. Hgb A1c Recent Labs    04/25/24 0610  HGBA1C 6.0*   Lipid Profile Recent Labs    04/25/24 0610  CHOL 279*  HDL 47  LDLCALC 195*  TRIG 184*  CHOLHDL 5.9   Thyroid  function studies No results for input(s): TSH, T4TOTAL, T3FREE, THYROIDAB in the last 72 hours.  Invalid input(s): FREET3 Anemia work up No results for input(s):  VITAMINB12, FOLATE, FERRITIN, TIBC, IRON, RETICCTPCT in the last 72 hours. Urinalysis    Component Value Date/Time   COLORURINE YELLOW (A) 03/07/2024 1309   APPEARANCEUR CLEAR (A) 03/07/2024 1309   APPEARANCEUR Clear 09/03/2013 2245   LABSPEC 1.020 03/07/2024 1309   LABSPEC 1.019 09/03/2013 2245   PHURINE 6.0 03/07/2024 1309   GLUCOSEU NEGATIVE 03/07/2024 1309   GLUCOSEU Negative 09/03/2013 2245   HGBUR NEGATIVE 03/07/2024 1309   BILIRUBINUR NEGATIVE 03/07/2024 1309   BILIRUBINUR Negative 09/03/2013 2245   KETONESUR NEGATIVE 03/07/2024 1309   PROTEINUR NEGATIVE 03/07/2024 1309   NITRITE NEGATIVE 03/07/2024 1309   LEUKOCYTESUR NEGATIVE 03/07/2024 1309   LEUKOCYTESUR Negative 09/03/2013 2245   Sepsis Labs Recent Labs  Lab 04/24/24 1130  WBC 5.3   Microbiology No results found for this or any previous visit (from the past 240 hours).   Time coordinating discharge: 32 min   SIGNED: Marcoantonio Legault, DO [facility] 04/26/2024, 12:10 PM Pager   If 7PM-7AM, please contact night-coverage

## 2024-04-26 NOTE — Progress Notes (Signed)
..      [facility] REFERRAL        Occupational Therapy * Physical Therapy * Speech Therapy                           DATE: 04/26/2024  PATIENT NAME Jocelyn Simon  PATIENT MRN [id number]       DIAGNOSIS/DIAGNOSIS CODE Stroke like symptom   R29.90    DATE OF DISCHARGE: 04/26/2024       PRIMARY CARE PHYSICIAN      PCP PHONE/FAX  [facility]     Dear Provider (Name: [facility] outpatient __  Fax: [phone]   I certify that I have examined this patient and that occupational/physical/speech therapy is necessary on an outpatient basis.    The patient has expressed interest in completing their recommended course of therapy at your  location.  Once a formal order from the patient's primary care physician has been obtained, please  contact him/her to schedule an appointment for evaluation at your earliest convenience.   [ X]  Physical Therapy Evaluate and Treat  [ X ]  Occupational Therapy Evaluate and Treat  [  ]  Speech Therapy Evaluate and Treat         The patient's primary care physician (listed above) must furnish and be responsible for a formal order such that the recommended services may be furnished while under the primary physician's care, and that the plan of care will be established and reviewed every 30 days (or more often if condition necessitates).

## 2024-04-26 NOTE — Progress Notes (Signed)
 TOC Brief Assessment  Patient presented with LUE tingling and admitted to r/o stroke. Plan to discharge to home with outpatient PT/OT when medically ready for discharge.

## 2024-04-26 NOTE — TOC Transition Note (Signed)
 Transition of Care [facility]) - Discharge Note   Patient Details  Name: Jocelyn Simon MRN: [id number] Date of Birth: 05-May-1966  Transition of Care [facility] CM/SW Contact:  Dalia GORMAN Fuse, RN Phone Number: 04/26/2024, 11:48 AM   Clinical Narrative:      Patient is medically clear to discharge to home with outpatient therapy at [facility] [place], referral for RW and 3 in 1 sent to [employer]. [facility] accepted and will have the equipment shipped to the patient's home.         Patient Goals and CMS Choice            Discharge Placement                       Discharge Plan and Services Additional resources added to the After Visit Summary for                                       Social Drivers of Health (SDOH) Interventions SDOH Screenings   Food Insecurity: No Food Insecurity (04/24/2024)  Recent Concern: Food Insecurity - Food Insecurity Present (03/04/2024)  Housing: Low Risk  (04/24/2024)  Transportation Needs: No Transportation Needs (04/24/2024)  Utilities: Patient Declined (04/24/2024)  Financial Resource Strain: Low Risk  (10/06/2023)   Received from [facility]  Social Connections: Moderately Integrated (04/24/2024)  Tobacco Use: Medium Risk (04/24/2024)     Readmission Risk Interventions     No data to display

## 2024-04-26 NOTE — Progress Notes (Signed)
 Occupational Therapy Treatment Patient Details Name: Jocelyn Simon MRN: [id number] DOB: Jul 18, 1965 Today's Date: 04/26/2024   History of present illness Pt is a 58 y/o F admitted 04/24/24 after presenting with LUE numbness. MRI negative for acute issues. PMH: CVA, anxiety, COPD, HLD, HTN   OT comments  Patient seen for OT treatment on this date. Upon arrival to room patient reports doing full ADL this morning, no difficulty reported, OT observed no deficits, she is agreeable to treatment. Patient able to don sneakers without A. OT set up room with obstacles to work on navigating environmental barriers for home/work environments; patient able to navigate around items, large steps to simulate over thresholds/spills on floor- patient completed with SBA and no AD with no safety concerns noted. Patient ambulated 1 lap around nurses station with no A or cues for safety. MD in room upon arrival back and reports patient is cleared for DC. OT with plans to sign off regardless.  Patient would benefit from follow up OP OT to address LUE numbness.        If plan is discharge home, recommend the following:  A little help with walking and/or transfers;A little help with bathing/dressing/bathroom;Assistance with cooking/housework;Assist for transportation   Equipment Recommendations  Tub/shower seat    Recommendations for Other Services      Precautions / Restrictions Precautions Precautions: Fall Recall of Precautions/Restrictions: Intact Restrictions Weight Bearing Restrictions Per Provider Order: No       Mobility Bed Mobility                    Transfers Overall transfer level: Modified independent Equipment used: None                     Balance Overall balance assessment: Modified Independent         Standing balance support: No upper extremity supported, During functional activity Standing balance-Leahy Scale: Good                             ADL  either performed or assessed with clinical judgement   ADL Overall ADL's : Modified independent                                       General ADL Comments: performed full ADL this morning, no A needed, no safety concerns noted    Extremity/Trunk Assessment Upper Extremity Assessment Upper Extremity Assessment: LUE deficits/detail LUE Deficits / Details: decreased grasp, decreased shoulder flexion compared to RUE LUE Sensation: decreased light touch LUE Coordination: decreased gross motor   Lower Extremity Assessment Lower Extremity Assessment: Defer to PT evaluation        Vision       Perception     Praxis     Communication Communication Communication: No apparent difficulties   Cognition Arousal: Alert Behavior During Therapy: WFL for tasks assessed/performed Cognition: No apparent impairments             OT - Cognition Comments: A/Ox4                 Following commands: Intact        Cueing   Cueing Techniques: Verbal cues  Exercises      Shoulder Instructions       General Comments      Pertinent Vitals/ Pain  Pain Assessment Pain Assessment: No/denies pain  Home Living                                          Prior Functioning/Environment              Frequency  Other (comment) (no f/u needed from OT)        Progress Toward Goals  OT Goals(current goals can now be found in the care plan section)  Progress towards OT goals: Goals met/education completed, patient discharged from OT  Acute Rehab OT Goals Patient Stated Goal: to go home OT Goal Formulation: With patient Time For Goal Achievement: 05/09/24 Potential to Achieve Goals: Good ADL Goals Pt Will Perform Grooming: Independently;standing Pt Will Perform Lower Body Dressing: Independently;sitting/lateral leans Pt Will Transfer to Toilet: Independently;ambulating Pt Will Perform Toileting - Clothing Manipulation and hygiene:  Independently;sitting/lateral leans  Plan      Co-evaluation                 AM-PAC OT 6 Clicks Daily Activity     Outcome Measure   Help from another person eating meals?: None Help from another person taking care of personal grooming?: None Help from another person toileting, which includes using toliet, bedpan, or urinal?: None Help from another person bathing (including washing, rinsing, drying)?: None Help from another person to put on and taking off regular upper body clothing?: None Help from another person to put on and taking off regular lower body clothing?: None 6 Click Score: 24    End of Session Equipment Utilized During Treatment: Gait belt  OT Visit Diagnosis: Unsteadiness on feet (R26.81);Repeated falls (R29.6);Other abnormalities of gait and mobility (R26.89);Muscle weakness (generalized) (M62.81)   Activity Tolerance Patient tolerated treatment well   Patient Left in chair;Other (comment) (MD in room, discussing discharge)   Nurse Communication Mobility status        Time: 1024-1040 OT Time Calculation (min): 16 min  Charges: OT General Charges $OT Visit: 1 Visit OT Treatments $Self Care/Home Management : 8-22 mins  Rogers Clause, OT/L [facility], 04/26/2024

## 2024-04-26 NOTE — Progress Notes (Signed)
 The patient requires the use of a 3 in 1 BSC due to significant mobility and safety limitations. The patient is unable to ambulate safely to the bathroom because of advanced age, weakness, and fall risk. Transferring on and off a standard toilet presents a hazard, as the patient demonstrated decreased balance, endurance and strength.

## 2024-05-02 ENCOUNTER — Emergency Department

## 2024-05-02 ENCOUNTER — Other Ambulatory Visit: Payer: Self-pay

## 2024-05-02 ENCOUNTER — Emergency Department
Admission: EM | Admit: 2024-05-02 | Discharge: 2024-05-02 | Disposition: A | Discharge status: Home / Self Care | Attending: Emergency Medicine | Admitting: Emergency Medicine

## 2024-05-02 DIAGNOSIS — Z8673 Personal history of transient ischemic attack (TIA), and cerebral infarction without residual deficits: Secondary | ICD-10-CM | POA: Insufficient documentation

## 2024-05-02 DIAGNOSIS — T148XXA Other injury of unspecified body region, initial encounter: Secondary | ICD-10-CM

## 2024-05-02 DIAGNOSIS — X58XXXA Exposure to other specified factors, initial encounter: Secondary | ICD-10-CM | POA: Diagnosis not present

## 2024-05-02 DIAGNOSIS — M545 Low back pain, unspecified: Secondary | ICD-10-CM | POA: Diagnosis present

## 2024-05-02 DIAGNOSIS — S39012A Strain of muscle, fascia and tendon of lower back, initial encounter: Secondary | ICD-10-CM | POA: Diagnosis not present

## 2024-05-02 DIAGNOSIS — I10 Essential (primary) hypertension: Secondary | ICD-10-CM | POA: Insufficient documentation

## 2024-05-02 DIAGNOSIS — J069 Acute upper respiratory infection, unspecified: Secondary | ICD-10-CM | POA: Diagnosis not present

## 2024-05-02 LAB — CBC
HCT: 37.3 % (ref 36.0–46.0)
Hemoglobin: 11.9 g/dL — ABNORMAL LOW (ref 12.0–15.0)
MCH: 27.6 pg (ref 26.0–34.0)
MCHC: 31.9 g/dL (ref 30.0–36.0)
MCV: 86.5 fL (ref 80.0–100.0)
Platelets: 310 K/uL (ref 150–400)
RBC: 4.31 MIL/uL (ref 3.87–5.11)
RDW: 13.6 % (ref 11.5–15.5)
WBC: 5.8 K/uL (ref 4.0–10.5)
nRBC: 0 % (ref 0.0–0.2)

## 2024-05-02 LAB — GROUP A STREP BY PCR: Group A Strep by PCR: NOT DETECTED

## 2024-05-02 LAB — URINALYSIS, ROUTINE W REFLEX MICROSCOPIC
Bilirubin Urine: NEGATIVE
Glucose, UA: NEGATIVE mg/dL
Hgb urine dipstick: NEGATIVE
Ketones, ur: NEGATIVE mg/dL
Leukocytes,Ua: NEGATIVE
Nitrite: NEGATIVE
Protein, ur: NEGATIVE mg/dL
Specific Gravity, Urine: 1.024 (ref 1.005–1.030)
pH: 6 (ref 5.0–8.0)

## 2024-05-02 LAB — RESP PANEL BY RT-PCR (RSV, FLU A&B, COVID)  RVPGX2
Influenza A by PCR: NEGATIVE
Influenza B by PCR: NEGATIVE
Resp Syncytial Virus by PCR: NEGATIVE
SARS Coronavirus 2 by RT PCR: NEGATIVE

## 2024-05-02 LAB — BASIC METABOLIC PANEL WITH GFR
Anion gap: 12 (ref 5–15)
BUN: 19 mg/dL (ref 6–20)
CO2: 24 mmol/L (ref 22–32)
Calcium: 8.7 mg/dL — ABNORMAL LOW (ref 8.9–10.3)
Chloride: 103 mmol/L (ref 98–111)
Creatinine, Ser: 0.61 mg/dL (ref 0.44–1.00)
GFR, Estimated: >60 mL/min (ref >60)
Glucose, Bld: 112 mg/dL — ABNORMAL HIGH (ref 70–99)
Potassium: 4 mmol/L (ref 3.5–5.1)
Sodium: 139 mmol/L (ref 135–145)

## 2024-05-02 MED ORDER — ONDANSETRON 4 MG PO TBDP
4.0000 mg | ORAL_TABLET | Freq: Once | ORAL | Status: AC
  Administered 2024-05-02: 4 mg via ORAL
  Filled 2024-05-02: qty 1

## 2024-05-02 MED ORDER — DOXYCYCLINE HYCLATE 100 MG PO TABS: 100.0000 mg | ORAL_TABLET | Freq: Two times a day (BID) | ORAL | 0 refills | Status: AC

## 2024-05-02 MED ORDER — PREDNISONE 10 MG (21) PO TBPK: ORAL_TABLET | ORAL | 0 refills | Status: AC

## 2024-05-02 MED ORDER — BACLOFEN 10 MG PO TABS: 10.0000 mg | ORAL_TABLET | Freq: Three times a day (TID) | ORAL | 0 refills | Status: AC

## 2024-05-02 MED ORDER — KETOROLAC TROMETHAMINE 15 MG/ML IJ SOLN
15.0000 mg | Freq: Once | INTRAMUSCULAR | Status: AC
  Administered 2024-05-02: 15 mg via INTRAMUSCULAR
  Filled 2024-05-02: qty 1

## 2024-05-02 MED ORDER — ONDANSETRON 4 MG PO TBDP: 4.0000 mg | ORAL_TABLET | Freq: Three times a day (TID) | ORAL | 0 refills | Status: AC | PRN

## 2024-05-02 MED ORDER — CYCLOBENZAPRINE HCL 10 MG PO TABS
10.0000 mg | ORAL_TABLET | Freq: Once | ORAL | Status: AC
  Administered 2024-05-02: 10 mg via ORAL
  Filled 2024-05-02: qty 1

## 2024-05-02 NOTE — ED Provider Notes (Signed)
 [facility] Provider Note    Event Date/Time   First MD Initiated Contact with Patient 05/02/24 1100     (approximate)   History   Cough   HPI  Jocelyn Simon is a 58 y.o. female history of hypertension, hyperlipidemia, CVA, headache presents emergency department with complaints of cough, flank pain, and sore throat for 2 days.  Patient states she was discharged from the hospital on 04/26/2024 for strokelike symptoms.  States she thinks she caught a cold while here in the hospital.  Santina back to work immediately after being discharged.  Thinks she went back to work too soon.  States the flank pain is more associated with her lower back and goes into the buttock and down her leg.  States she has been coughing so hard she thinks this may have started the muscle spasms.  Denies any loss of bowel or bladder control.  No strokelike symptoms.      Physical Exam   Triage Vital Signs: ED Triage Vitals  Encounter Vitals Group     BP 05/02/24 0739 (!) 129/107     Girls Systolic BP Percentile --      Girls Diastolic BP Percentile --      Boys Systolic BP Percentile --      Boys Diastolic BP Percentile --      Pulse Rate 05/02/24 0739 70     Resp 05/02/24 0739 18     Temp 05/02/24 0739 98.2 F (36.8 C)     Temp src --      SpO2 05/02/24 0739 100 %     Weight 05/02/24 0737 202 lb 2.6 oz (91.7 kg)     Height 05/02/24 0737 5' 1 (1.549 m)     Head Circumference --      Peak Flow --      Pain Score 05/02/24 0737 10     Pain Loc --      Pain Education --      Exclude from Growth Chart --     Most recent vital signs: Vitals:   05/02/24 0739  BP: (!) 129/107  Pulse: 70  Resp: 18  Temp: 98.2 F (36.8 C)  SpO2: 100%     General: Awake, no distress.   CV:  Good peripheral perfusion.  Resp:  Normal effort.  Abd:  No distention.  Nontender Other:  Patient appears well, muscle spasm noted along the lower right side of her back, no CVA tenderness, no  rib tenderness   ED Results / Procedures / Treatments   Labs (all labs ordered are listed, but only abnormal results are displayed) Labs Reviewed  CBC - Abnormal; Notable for the following components:      Result Value   Hemoglobin 11.9 (*)    All other components within normal limits  BASIC METABOLIC PANEL WITH GFR - Abnormal; Notable for the following components:   Glucose, Bld 112 (*)    Calcium  8.7 (*)    All other components within normal limits  URINALYSIS, ROUTINE W REFLEX MICROSCOPIC - Abnormal; Notable for the following components:   Color, Urine YELLOW (*)    APPearance CLEAR (*)    All other components within normal limits  RESP PANEL BY RT-PCR (RSV, FLU A&B, COVID)  [id number]  GROUP A STREP BY PCR     EKG     RADIOLOGY     PROCEDURES:   Procedures  Critical Care:  no Chief Complaint  Patient presents with  Cough      MEDICATIONS ORDERED IN ED: Medications  ketorolac  (TORADOL ) 15 MG/ML injection 15 mg (15 mg Intramuscular Given 05/02/24 1135)  cyclobenzaprine  (FLEXERIL ) tablet 10 mg (10 mg Oral Given by Other 05/02/24 1134)  ondansetron  (ZOFRAN -ODT) disintegrating tablet 4 mg (4 mg Oral Given 05/02/24 1134)     IMPRESSION / MDM / ASSESSMENT AND PLAN / ED COURSE  I reviewed the triage vital signs and the nursing notes.                              Differential diagnosis includes, but is not limited to, COVID, influenza, RSV, strep throat, CAP, electrolyte imbalance, UTI, kidney stone  Patient's presentation is most consistent with acute illness / injury with system symptoms.   Medications given: Toradol  15 mg IM, Flexeril  10 mg p.o., Zofran  4 mg ODT as patient complained of nausea  Labs are reassuring  Chest x-ray independently reviewed interpreted by me as being negative for acute abnormality  Did explain findings to patient.  Feel this is most likely a URI, combined with muscle strain.  Will go ahead and start her on doxycycline,  Sterapred, baclofen, and Zofran  for nausea if needed.  Follow-up with her regular doctor if not improving to 3 days.  Return emergency department for worsening.  She is in agreement treatment plan.  Discharged stable condition.      FINAL CLINICAL IMPRESSION(S) / ED DIAGNOSES   Final diagnoses:  Acute URI  Muscle strain     Rx / DC Orders   ED Discharge Orders          Ordered    predniSONE  (STERAPRED UNI-PAK 21 TAB) 10 MG (21) TBPK tablet        05/02/24 1120    baclofen (LIORESAL) 10 MG tablet  3 times daily        05/02/24 1120    doxycycline (VIBRA-TABS) 100 MG tablet  2 times daily        05/02/24 1120    ondansetron  (ZOFRAN -ODT) 4 MG disintegrating tablet  Every 8 hours PRN        05/02/24 1120             Note:  This document was prepared using Dragon voice recognition software and may include unintentional dictation errors.    Gasper Devere ORN, PA-C 05/02/24 1304    Jossie Artist POUR, MD 05/02/24 [phone]

## 2024-05-02 NOTE — Discharge Instructions (Signed)
 Follow-up with your regular doctor if not improving in 2 to 3 days.  Take medication as prescribed.  Return if worsening.

## 2024-05-02 NOTE — ED Triage Notes (Signed)
 Pt comes with cough and right side pain. Pt states this started two days ago. Pt state sore throat and burning when coughing. Pt states lower back pain.
